# Patient Record
Sex: Male | Born: 1935 | Race: Black or African American | Hispanic: No | Smoking: Never smoker
Health system: Southern US, Community
[De-identification: ages and names within clinical notes are randomized; demographics above are authoritative.]

## PROBLEM LIST (undated history)

## (undated) DIAGNOSIS — I099 Rheumatic heart disease, unspecified: Secondary | ICD-10-CM

## (undated) DIAGNOSIS — I495 Sick sinus syndrome: Secondary | ICD-10-CM

## (undated) DIAGNOSIS — M545 Low back pain, unspecified: Secondary | ICD-10-CM

## (undated) DIAGNOSIS — I739 Peripheral vascular disease, unspecified: Secondary | ICD-10-CM

## (undated) DIAGNOSIS — C61 Malignant neoplasm of prostate: Secondary | ICD-10-CM

## (undated) DIAGNOSIS — I4891 Unspecified atrial fibrillation: Secondary | ICD-10-CM

## (undated) DIAGNOSIS — I10 Essential (primary) hypertension: Secondary | ICD-10-CM

## (undated) DIAGNOSIS — R0602 Shortness of breath: Secondary | ICD-10-CM

## (undated) DIAGNOSIS — Z950 Presence of cardiac pacemaker: Secondary | ICD-10-CM

## (undated) DIAGNOSIS — G8929 Other chronic pain: Secondary | ICD-10-CM

## (undated) DIAGNOSIS — K559 Vascular disorder of intestine, unspecified: Secondary | ICD-10-CM

## (undated) DIAGNOSIS — F0390 Unspecified dementia without behavioral disturbance: Secondary | ICD-10-CM

## (undated) HISTORY — DX: Low back pain, unspecified: M54.50

## (undated) HISTORY — DX: Rheumatic heart disease, unspecified: I09.9

## (undated) HISTORY — DX: Sick sinus syndrome: I49.5

## (undated) HISTORY — DX: Essential (primary) hypertension: I10

## (undated) HISTORY — PX: TRANSURETHRAL RESECTION OF PROSTATE: SHX73

## (undated) HISTORY — DX: Other chronic pain: G89.29

## (undated) HISTORY — DX: Malignant neoplasm of prostate: C61

## (undated) HISTORY — DX: Low back pain: M54.5

---

## 1997-08-10 ENCOUNTER — Inpatient Hospital Stay (HOSPITAL_COMMUNITY): Admission: EM | Admit: 1997-08-10 | Discharge: 1997-08-13 | Payer: Self-pay | Admitting: Cardiology

## 1999-05-30 ENCOUNTER — Encounter: Admission: RE | Admit: 1999-05-30 | Discharge: 1999-08-28 | Payer: Self-pay | Admitting: Radiation Oncology

## 2000-03-18 HISTORY — PX: EMBOLECTOMY: SHX44

## 2000-03-18 HISTORY — PX: FEMORAL-PERONEAL BYPASS GRAFT: SHX5163

## 2000-05-30 ENCOUNTER — Encounter: Payer: Self-pay | Admitting: Vascular Surgery

## 2000-06-02 ENCOUNTER — Ambulatory Visit (HOSPITAL_COMMUNITY): Admission: RE | Admit: 2000-06-02 | Discharge: 2000-06-02 | Payer: Self-pay | Admitting: Vascular Surgery

## 2000-06-04 ENCOUNTER — Encounter: Payer: Self-pay | Admitting: Vascular Surgery

## 2000-06-04 ENCOUNTER — Inpatient Hospital Stay (HOSPITAL_COMMUNITY): Admission: RE | Admit: 2000-06-04 | Discharge: 2000-06-10 | Payer: Self-pay | Admitting: Vascular Surgery

## 2000-11-10 ENCOUNTER — Ambulatory Visit (HOSPITAL_COMMUNITY): Admission: RE | Admit: 2000-11-10 | Discharge: 2000-11-10 | Payer: Self-pay | Admitting: General Surgery

## 2000-11-19 ENCOUNTER — Encounter: Payer: Self-pay | Admitting: Emergency Medicine

## 2000-11-19 ENCOUNTER — Observation Stay (HOSPITAL_COMMUNITY): Admission: EM | Admit: 2000-11-19 | Discharge: 2000-11-20 | Payer: Self-pay | Admitting: Emergency Medicine

## 2000-11-20 ENCOUNTER — Encounter: Payer: Self-pay | Admitting: Vascular Surgery

## 2000-11-20 ENCOUNTER — Inpatient Hospital Stay (HOSPITAL_COMMUNITY): Admission: EM | Admit: 2000-11-20 | Discharge: 2000-12-14 | Payer: Self-pay | Admitting: Emergency Medicine

## 2000-11-20 ENCOUNTER — Encounter (INDEPENDENT_AMBULATORY_CARE_PROVIDER_SITE_OTHER): Payer: Self-pay | Admitting: *Deleted

## 2000-11-20 ENCOUNTER — Encounter: Payer: Self-pay | Admitting: Family Medicine

## 2000-11-21 ENCOUNTER — Encounter: Payer: Self-pay | Admitting: Vascular Surgery

## 2000-11-22 ENCOUNTER — Encounter: Payer: Self-pay | Admitting: Vascular Surgery

## 2000-11-29 ENCOUNTER — Encounter: Payer: Self-pay | Admitting: Vascular Surgery

## 2000-12-01 ENCOUNTER — Encounter: Payer: Self-pay | Admitting: Vascular Surgery

## 2001-02-10 ENCOUNTER — Ambulatory Visit (HOSPITAL_COMMUNITY): Admission: RE | Admit: 2001-02-10 | Discharge: 2001-02-10 | Payer: Self-pay | Admitting: Cardiology

## 2001-09-29 ENCOUNTER — Inpatient Hospital Stay (HOSPITAL_COMMUNITY): Admission: RE | Admit: 2001-09-29 | Discharge: 2001-10-01 | Payer: Self-pay | Admitting: General Surgery

## 2001-09-29 ENCOUNTER — Encounter: Payer: Self-pay | Admitting: General Surgery

## 2001-10-22 ENCOUNTER — Emergency Department (HOSPITAL_COMMUNITY): Admission: EM | Admit: 2001-10-22 | Discharge: 2001-10-22 | Payer: Self-pay | Admitting: *Deleted

## 2001-10-28 ENCOUNTER — Ambulatory Visit (HOSPITAL_COMMUNITY): Admission: RE | Admit: 2001-10-28 | Discharge: 2001-10-28 | Payer: Self-pay | Admitting: Cardiology

## 2002-03-08 ENCOUNTER — Inpatient Hospital Stay (HOSPITAL_COMMUNITY): Admission: EM | Admit: 2002-03-08 | Discharge: 2002-03-10 | Payer: Self-pay | Admitting: Emergency Medicine

## 2002-06-11 ENCOUNTER — Inpatient Hospital Stay (HOSPITAL_COMMUNITY): Admission: AD | Admit: 2002-06-11 | Discharge: 2002-06-14 | Payer: Self-pay | Admitting: Family Medicine

## 2002-06-21 ENCOUNTER — Inpatient Hospital Stay (HOSPITAL_COMMUNITY): Admission: EM | Admit: 2002-06-21 | Discharge: 2002-06-23 | Payer: Self-pay | Admitting: Emergency Medicine

## 2002-06-21 ENCOUNTER — Encounter: Payer: Self-pay | Admitting: General Surgery

## 2002-10-03 ENCOUNTER — Emergency Department (HOSPITAL_COMMUNITY): Admission: EM | Admit: 2002-10-03 | Discharge: 2002-10-03 | Payer: Self-pay | Admitting: *Deleted

## 2002-10-03 ENCOUNTER — Encounter: Payer: Self-pay | Admitting: Emergency Medicine

## 2004-01-20 ENCOUNTER — Ambulatory Visit: Payer: Self-pay | Admitting: *Deleted

## 2004-05-10 ENCOUNTER — Ambulatory Visit: Payer: Self-pay | Admitting: Cardiology

## 2004-06-13 ENCOUNTER — Ambulatory Visit: Payer: Self-pay | Admitting: *Deleted

## 2004-07-13 ENCOUNTER — Ambulatory Visit: Payer: Self-pay | Admitting: Cardiology

## 2004-08-29 ENCOUNTER — Ambulatory Visit (HOSPITAL_COMMUNITY): Admission: RE | Admit: 2004-08-29 | Discharge: 2004-08-29 | Payer: Self-pay | Admitting: Cardiology

## 2004-08-29 ENCOUNTER — Ambulatory Visit: Payer: Self-pay | Admitting: Cardiology

## 2004-08-29 ENCOUNTER — Ambulatory Visit: Payer: Self-pay | Admitting: *Deleted

## 2004-11-26 ENCOUNTER — Ambulatory Visit: Payer: Self-pay | Admitting: *Deleted

## 2005-02-12 ENCOUNTER — Ambulatory Visit: Payer: Self-pay | Admitting: Cardiology

## 2005-04-09 ENCOUNTER — Ambulatory Visit: Payer: Self-pay | Admitting: *Deleted

## 2005-05-02 ENCOUNTER — Ambulatory Visit: Payer: Self-pay | Admitting: Cardiology

## 2005-07-10 ENCOUNTER — Ambulatory Visit: Payer: Self-pay | Admitting: *Deleted

## 2005-08-20 ENCOUNTER — Ambulatory Visit: Payer: Self-pay | Admitting: *Deleted

## 2005-08-20 ENCOUNTER — Ambulatory Visit (HOSPITAL_COMMUNITY): Admission: RE | Admit: 2005-08-20 | Discharge: 2005-08-20 | Payer: Self-pay | Admitting: Cardiology

## 2005-12-02 ENCOUNTER — Ambulatory Visit: Payer: Self-pay | Admitting: Cardiology

## 2006-01-02 ENCOUNTER — Ambulatory Visit: Payer: Self-pay | Admitting: Cardiology

## 2006-02-05 ENCOUNTER — Ambulatory Visit: Payer: Self-pay | Admitting: Cardiology

## 2006-04-02 ENCOUNTER — Ambulatory Visit: Payer: Self-pay | Admitting: Cardiology

## 2006-06-16 ENCOUNTER — Ambulatory Visit: Payer: Self-pay | Admitting: Internal Medicine

## 2006-07-24 ENCOUNTER — Ambulatory Visit: Payer: Self-pay | Admitting: Cardiology

## 2007-03-09 ENCOUNTER — Ambulatory Visit: Payer: Self-pay | Admitting: Internal Medicine

## 2007-04-29 ENCOUNTER — Ambulatory Visit: Payer: Self-pay | Admitting: Cardiology

## 2007-05-01 ENCOUNTER — Ambulatory Visit: Payer: Self-pay | Admitting: Cardiology

## 2007-05-01 ENCOUNTER — Ambulatory Visit (HOSPITAL_COMMUNITY): Admission: RE | Admit: 2007-05-01 | Discharge: 2007-05-01 | Payer: Self-pay | Admitting: Cardiology

## 2007-05-25 ENCOUNTER — Ambulatory Visit: Payer: Self-pay | Admitting: Internal Medicine

## 2007-06-01 ENCOUNTER — Ambulatory Visit: Payer: Self-pay | Admitting: Cardiology

## 2007-06-15 ENCOUNTER — Ambulatory Visit: Payer: Self-pay | Admitting: Cardiology

## 2007-06-22 ENCOUNTER — Ambulatory Visit: Payer: Self-pay | Admitting: Cardiology

## 2007-09-21 ENCOUNTER — Ambulatory Visit: Payer: Self-pay | Admitting: Cardiovascular Disease

## 2007-10-14 ENCOUNTER — Ambulatory Visit: Payer: Self-pay | Admitting: Cardiovascular Disease

## 2007-12-31 ENCOUNTER — Ambulatory Visit: Payer: Self-pay | Admitting: Cardiology

## 2007-12-31 ENCOUNTER — Encounter (INDEPENDENT_AMBULATORY_CARE_PROVIDER_SITE_OTHER): Payer: Self-pay | Admitting: *Deleted

## 2007-12-31 LAB — CONVERTED CEMR LAB
ALT: 9 units/L
BUN: 21 mg/dL
Basophils Absolute: 0 10*3/uL
CO2: 19 meq/L
Calcium: 8.8 mg/dL
Chloride: 105 meq/L
Creatinine, Ser: 1.04 mg/dL
Eosinophils Relative: 0.2 %
Lymphocytes Relative: 35 %
Lymphs Abs: 1.4 10*3/uL
Platelets: 213 10*3/uL
Potassium: 4.6 meq/L
Total Protein: 7.5 g/dL
WBC: 4.1 10*3/uL

## 2008-01-04 ENCOUNTER — Ambulatory Visit: Payer: Self-pay | Admitting: Cardiology

## 2008-02-18 ENCOUNTER — Ambulatory Visit: Payer: Self-pay | Admitting: Cardiology

## 2008-03-14 ENCOUNTER — Ambulatory Visit: Payer: Self-pay | Admitting: Cardiology

## 2008-06-28 ENCOUNTER — Inpatient Hospital Stay (HOSPITAL_COMMUNITY): Admission: EM | Admit: 2008-06-28 | Discharge: 2008-07-01 | Payer: Self-pay | Admitting: Emergency Medicine

## 2008-07-08 ENCOUNTER — Ambulatory Visit: Payer: Self-pay | Admitting: Cardiology

## 2008-07-14 ENCOUNTER — Ambulatory Visit: Payer: Self-pay | Admitting: Cardiology

## 2008-09-26 ENCOUNTER — Ambulatory Visit: Payer: Self-pay | Admitting: Cardiology

## 2008-10-17 ENCOUNTER — Encounter: Payer: Self-pay | Admitting: Cardiology

## 2008-10-19 ENCOUNTER — Ambulatory Visit: Payer: Self-pay | Admitting: Cardiology

## 2008-10-31 ENCOUNTER — Encounter: Payer: Self-pay | Admitting: *Deleted

## 2008-11-02 ENCOUNTER — Ambulatory Visit: Payer: Self-pay

## 2008-11-02 LAB — CONVERTED CEMR LAB: POC INR: 2.8

## 2008-12-07 ENCOUNTER — Encounter (INDEPENDENT_AMBULATORY_CARE_PROVIDER_SITE_OTHER): Payer: Self-pay | Admitting: Cardiology

## 2008-12-29 ENCOUNTER — Ambulatory Visit: Payer: Self-pay | Admitting: Cardiology

## 2009-02-02 ENCOUNTER — Encounter (INDEPENDENT_AMBULATORY_CARE_PROVIDER_SITE_OTHER): Payer: Self-pay | Admitting: Cardiology

## 2009-02-06 ENCOUNTER — Ambulatory Visit: Payer: Self-pay | Admitting: Cardiology

## 2009-02-06 LAB — CONVERTED CEMR LAB: POC INR: 3.3

## 2009-04-05 ENCOUNTER — Encounter (INDEPENDENT_AMBULATORY_CARE_PROVIDER_SITE_OTHER): Payer: Self-pay | Admitting: Cardiology

## 2009-04-20 ENCOUNTER — Ambulatory Visit: Payer: Self-pay | Admitting: Cardiology

## 2009-04-20 LAB — CONVERTED CEMR LAB: POC INR: 3.6

## 2009-05-18 ENCOUNTER — Encounter (INDEPENDENT_AMBULATORY_CARE_PROVIDER_SITE_OTHER): Payer: Self-pay | Admitting: *Deleted

## 2009-06-01 ENCOUNTER — Ambulatory Visit: Payer: Self-pay | Admitting: Cardiology

## 2009-06-01 LAB — CONVERTED CEMR LAB: POC INR: 6.3

## 2009-06-05 ENCOUNTER — Ambulatory Visit: Payer: Self-pay | Admitting: Cardiology

## 2009-06-14 ENCOUNTER — Encounter (INDEPENDENT_AMBULATORY_CARE_PROVIDER_SITE_OTHER): Payer: Self-pay | Admitting: *Deleted

## 2009-07-13 ENCOUNTER — Encounter (INDEPENDENT_AMBULATORY_CARE_PROVIDER_SITE_OTHER): Payer: Self-pay | Admitting: Pharmacist

## 2009-08-09 ENCOUNTER — Ambulatory Visit: Payer: Self-pay | Admitting: Cardiology

## 2009-08-09 LAB — CONVERTED CEMR LAB: POC INR: 3.5

## 2009-09-07 ENCOUNTER — Encounter (INDEPENDENT_AMBULATORY_CARE_PROVIDER_SITE_OTHER): Payer: Self-pay | Admitting: *Deleted

## 2009-09-11 ENCOUNTER — Encounter (INDEPENDENT_AMBULATORY_CARE_PROVIDER_SITE_OTHER): Payer: Self-pay | Admitting: *Deleted

## 2009-09-11 ENCOUNTER — Ambulatory Visit: Payer: Self-pay | Admitting: Cardiology

## 2009-09-11 DIAGNOSIS — I495 Sick sinus syndrome: Secondary | ICD-10-CM

## 2009-09-11 DIAGNOSIS — M545 Low back pain: Secondary | ICD-10-CM | POA: Insufficient documentation

## 2009-09-11 DIAGNOSIS — Z8679 Personal history of other diseases of the circulatory system: Secondary | ICD-10-CM | POA: Insufficient documentation

## 2009-09-11 DIAGNOSIS — Z8546 Personal history of malignant neoplasm of prostate: Secondary | ICD-10-CM | POA: Insufficient documentation

## 2009-09-11 DIAGNOSIS — I050 Rheumatic mitral stenosis: Secondary | ICD-10-CM

## 2009-09-11 DIAGNOSIS — E785 Hyperlipidemia, unspecified: Secondary | ICD-10-CM

## 2009-09-11 LAB — CONVERTED CEMR LAB: POC INR: 4.6

## 2009-09-12 ENCOUNTER — Encounter: Payer: Self-pay | Admitting: Cardiology

## 2009-09-22 ENCOUNTER — Encounter: Payer: Self-pay | Admitting: Cardiology

## 2009-09-22 ENCOUNTER — Encounter (INDEPENDENT_AMBULATORY_CARE_PROVIDER_SITE_OTHER): Payer: Self-pay | Admitting: *Deleted

## 2009-09-22 LAB — CONVERTED CEMR LAB
ALT: 13 units/L
Albumin: 4 g/dL
Albumin: 4 g/dL (ref 3.5–5.2)
Alkaline Phosphatase: 102 units/L
Alkaline Phosphatase: 102 units/L (ref 39–117)
BUN: 19 mg/dL (ref 6–23)
Basophils Absolute: 0 10*3/uL
CO2: 25 meq/L (ref 19–32)
Calcium: 9 mg/dL (ref 8.4–10.5)
Chloride: 103 meq/L (ref 96–112)
Cholesterol: 219 mg/dL
Creatinine, Ser: 1.15 mg/dL
Eosinophils Relative: 5 % (ref 0–5)
Glucose, Bld: 86 mg/dL (ref 70–99)
HCT: 42.8 % (ref 39.0–52.0)
HDL: 56 mg/dL
HDL: 56 mg/dL (ref >39)
Hemoglobin: 14.3 g/dL (ref 13.0–17.0)
LDL Cholesterol: 142 mg/dL
LDL Cholesterol: 142 mg/dL — ABNORMAL HIGH (ref 0–99)
Lymphocytes Relative: 38 %
Lymphocytes Relative: 38 % (ref 12–46)
Lymphs Abs: 1.5 10*3/uL
Lymphs Abs: 1.5 10*3/uL (ref 0.7–4.0)
MCHC: 33.4 g/dL
MCV: 85.6 fL
Monocytes Absolute: 0.5 10*3/uL
Monocytes Absolute: 0.5 10*3/uL (ref 0.1–1.0)
Monocytes Relative: 12 % (ref 3–12)
Neutro Abs: 1.8 10*3/uL (ref 1.7–7.7)
OCCULT 3: NEGATIVE
Platelets: 155 10*3/uL
Potassium: 4.1 meq/L (ref 3.5–5.3)
RBC: 5 M/uL (ref 4.22–5.81)
RDW: 15.1 % (ref 11.5–15.5)
Sodium: 140 meq/L
Sodium: 140 meq/L (ref 135–145)
Total Protein: 7.4 g/dL
Total Protein: 7.4 g/dL (ref 6.0–8.3)
Triglycerides: 104 mg/dL (ref <150)
WBC: 4 10*3/uL

## 2009-09-26 ENCOUNTER — Encounter (INDEPENDENT_AMBULATORY_CARE_PROVIDER_SITE_OTHER): Payer: Self-pay | Admitting: *Deleted

## 2009-09-26 ENCOUNTER — Ambulatory Visit: Payer: Self-pay | Admitting: Cardiology

## 2009-10-04 ENCOUNTER — Ambulatory Visit: Payer: Self-pay | Admitting: Cardiology

## 2009-10-04 LAB — CONVERTED CEMR LAB: POC INR: 5.3

## 2009-10-16 ENCOUNTER — Encounter (INDEPENDENT_AMBULATORY_CARE_PROVIDER_SITE_OTHER): Payer: Self-pay | Admitting: *Deleted

## 2009-10-28 ENCOUNTER — Emergency Department (HOSPITAL_COMMUNITY): Admission: EM | Admit: 2009-10-28 | Discharge: 2009-10-29 | Payer: Self-pay | Admitting: Emergency Medicine

## 2009-10-30 ENCOUNTER — Ambulatory Visit: Payer: Self-pay | Admitting: Cardiology

## 2009-11-10 ENCOUNTER — Encounter: Payer: Self-pay | Admitting: Cardiology

## 2009-11-22 ENCOUNTER — Encounter (INDEPENDENT_AMBULATORY_CARE_PROVIDER_SITE_OTHER): Payer: Self-pay | Admitting: *Deleted

## 2009-11-22 ENCOUNTER — Encounter: Payer: Self-pay | Admitting: Cardiology

## 2009-11-29 ENCOUNTER — Encounter (INDEPENDENT_AMBULATORY_CARE_PROVIDER_SITE_OTHER): Payer: Self-pay | Admitting: Pharmacist

## 2009-12-27 ENCOUNTER — Ambulatory Visit: Payer: Self-pay | Admitting: Cardiology

## 2009-12-27 LAB — CONVERTED CEMR LAB: POC INR: 1.2

## 2010-01-04 ENCOUNTER — Ambulatory Visit: Payer: Self-pay | Admitting: Cardiology

## 2010-01-25 ENCOUNTER — Encounter (INDEPENDENT_AMBULATORY_CARE_PROVIDER_SITE_OTHER): Payer: Self-pay | Admitting: *Deleted

## 2010-04-11 ENCOUNTER — Encounter (INDEPENDENT_AMBULATORY_CARE_PROVIDER_SITE_OTHER): Payer: Self-pay | Admitting: *Deleted

## 2010-04-13 ENCOUNTER — Ambulatory Visit
Admission: RE | Admit: 2010-04-13 | Discharge: 2010-04-13 | Payer: Self-pay | Source: Home / Self Care | Attending: Cardiology | Admitting: Cardiology

## 2010-04-13 ENCOUNTER — Encounter: Payer: Self-pay | Admitting: Cardiology

## 2010-04-13 LAB — CONVERTED CEMR LAB
Casts: NONE SEEN /lpf
Crystals: NONE SEEN
Ketones, ur: NEGATIVE mg/dL
Nitrite: NEGATIVE
Specific Gravity, Urine: 1.005 (ref 1.005–1.030)
pH: 8 (ref 5.0–8.0)

## 2010-04-16 ENCOUNTER — Ambulatory Visit
Admission: RE | Admit: 2010-04-16 | Discharge: 2010-04-16 | Payer: Self-pay | Source: Home / Self Care | Attending: Cardiology | Admitting: Cardiology

## 2010-04-16 ENCOUNTER — Ambulatory Visit (HOSPITAL_COMMUNITY)
Admission: RE | Admit: 2010-04-16 | Discharge: 2010-04-16 | Payer: Self-pay | Source: Home / Self Care | Attending: Cardiology | Admitting: Cardiology

## 2010-04-16 ENCOUNTER — Telehealth (INDEPENDENT_AMBULATORY_CARE_PROVIDER_SITE_OTHER): Payer: Self-pay | Admitting: *Deleted

## 2010-04-16 LAB — CONVERTED CEMR LAB
AST: 23 units/L (ref 0–37)
BUN: 20 mg/dL (ref 6–23)
Basophils Relative: 1 % (ref 0–1)
Calcium: 9.7 mg/dL (ref 8.4–10.5)
Chloride: 102 meq/L (ref 96–112)
Creatinine, Ser: 1.11 mg/dL (ref 0.40–1.50)
Eosinophils Absolute: 0.2 10*3/uL (ref 0.0–0.7)
Eosinophils Relative: 4 % (ref 0–5)
HCT: 34.4 % — ABNORMAL LOW (ref 39.0–52.0)
Hemoglobin: 11.4 g/dL — ABNORMAL LOW (ref 13.0–17.0)
MCHC: 33.1 g/dL (ref 30.0–36.0)
MCV: 84.1 fL (ref 78.0–100.0)
Monocytes Absolute: 0.7 10*3/uL (ref 0.1–1.0)
Monocytes Relative: 15 % — ABNORMAL HIGH (ref 3–12)
RBC: 4.09 M/uL — ABNORMAL LOW (ref 4.22–5.81)
Total Bilirubin: 0.6 mg/dL (ref 0.3–1.2)

## 2010-04-17 NOTE — Assessment & Plan Note (Signed)
Summary: past due for f/u/tg  Medications Added MAG-OX 400 400 MG TABS (MAGNESIUM OXIDE) Take 1 tablet by mouth once a day FUROSEMIDE 40 MG TABS (FUROSEMIDE) Take 1 tablet by mouth once a day METOPROLOL TARTRATE 25 MG TABS (METOPROLOL TARTRATE) Take 1 tablet by mouth once a day POLY-IRON 150 FORTE 150-25-1 MG-MCG-MG CAPS (IRON POLYSACCH CMPLX-B12-FA) Take 1 tablet by mouth once a day      Allergies Added: NKDA  Visit Type:  past due for follow up Primary Provider:  Dr. Regino Chapman   History of Present Illness: [name] returns to the office somewhat beyond his scheduled visit for continued assessment and treatment of rheumatic heart disease with a history of paroxysmal atrial fibrillation.  Since I last saw him more than a year ago, he has been astoundingly well.  He has not required treatment in  the emergency department nor been admitted to hospital.  He denies new medical problems.  He is stressed by a daughter, who lives in his house, but who has previously been incarcerated and a friend who also lives in his house.  He is taking legal action in an attempt to evict them, but that has not been successful.  He does all his own cooking.  He reports anorexia of late with some weight loss.  He has a sedentary lifestyle, but typically is asymptomatic.  He notes occasional dyspnea with exertion.  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  EKG  Procedure date:  09/11/2009  Findings:      Atrial fibrillation with a slow ventricular response. Heart rate of 42 bpm. Rightward axis Nondiagnostic lateral Q waves Delayed R-wave progression Voltage criteria for LVH No previous tracings for comparison   Preventive Screening-Counseling & Management  Alcohol-Tobacco     Smoking Status: quit > 6 months  Current Medications (verified): 1)  Warfarin Sodium 5 Mg Tabs (Warfarin Sodium) .... Take As Directed By Coumadin Clinic 2)  Mag-Ox 400 400 Mg Tabs  (Magnesium Oxide) .... Take 1 Tablet By Mouth Once A Day 3)  Furosemide 40 Mg Tabs (Furosemide) .... Take 1 Tablet By Mouth Once A Day 4)  Cardizem 120 Mg Tabs (Diltiazem Hcl) 5)  Poly-Iron 150 Forte 150-25-1 Mg-Mcg-Mg Caps (Iron Polysacch Cmplx-B12-Fa) .... Take 1 Tablet By Mouth Once A Day  Allergies (verified): No Known Drug Allergies  Past History:  PMH, FH, and Social History reviewed and updated.  Past Medical History: Rheumatic heart disease with moderate mitral stenosis and history of CHF Hypertension Sick sinus syndrome: Paroxysmal and then constant AF; slow rate on modest AV Chapman blocking agents Carcinoma of the prostate treated with external beam RT in 2001 Chronic low back pain  Past Surgical History: Transurethral resection of the prostate Lumbosacral spinal fusion  Social History: Smoking Status:  quit > 6 months  Review of Systems       The patient complains of anorexia, weight loss, and dyspnea on exertion.  The patient denies fever, chest pain, syncope, peripheral edema, prolonged cough, headaches, hemoptysis, abdominal pain, and melena.    Vital Signs:  Patient profile:   77 year old male Height:      68 inches Weight:      118 pounds BMI:     18.01 O2 Sat:      94 % on Room air Pulse rate:   44 / minute BP sitting:   186 / 92  (left arm)  Vitals Entered By: Billy Lower RN (  September 11, 2009 1:57 PM)  O2 Flow:  Room air  Physical Exam  General:  Thin; well developed; no acute distress:   HEENT-poor dentition Neck-No JVD; no carotid bruits: Lungs-No tachypnea, no rales; no rhonchi; no wheezes:coarse breath sounds at the right base Cardiovascular-normal PMI; split S1 vs. fourth heart sound and normal S2; grade 2/6 rumbling diastolic murmur only heard over the apex, which is moderately displaced laterally and is forceful; opening snap present. Abdomen-BS normal; soft and non-tender without masses or organomegaly:  Musculoskeletal-No deformities, no  cyanosis or clubbing: Neurologic-Normal cranial nerves; symmetric strength and tone:  Skin-Warm, no significant lesions: Extremities-Nl distal pulses; no edema:     Impression & Recommendations:  Problem # 1:  RHEUMATIC MITRAL STENOSIS (ICD-394.0) Patient has done well in recent years with essentially no symptoms attributable to cardiac disease.  No echocardiogram has been obtained for at least the past 3 years, and a repeat study is in order.  Patient continues to need anticoagulation for atrial fibrillation, but not antibiotic prophylaxis.  Problem # 2:  SICK SINUS SYNDROME (ICD-427.81) Patient has underlying atrial fibrillation with bradycardia despite treatment with relatively low doses of beta blocker and diltiazem.  Metoprolol will be discontinued initially.  If heart rate remains slow, diltiazem dosage will be reduced or discontinued entirely.  [name] will return in one week to have his blood pressure and heart rate rechecked by the cardiology nurses.  Problem # 3:  ANTICOAGULATION (ICD-V58.61) Due to chronic anticoagulation, stool for Hemoccult testing will be obtained as well as a CBC.  Problem # 4:  HYPERTENSION (ICD-401.1) Blood pressure is substantially elevated at today's visit, but has been good when assessed in the recent past.  [name] will monitor values outside of the office and call for any significant elevations.  Otherwise, I will plan to see this nice gentleman again in 7 months.  Other Orders: Hemoccult Cards (Take Home) (Hemoccult Cards) EKG w/ Interpretation (93000) 2-D Echocardiogram (2D Echo) Future Orders: T-Comprehensive Metabolic Panel ([id number]) ... 09/19/2009 T-Lipid Profile [phone]) ... 09/19/2009 T-CBC w/Diff ([id number]) ... 09/19/2009  Patient Instructions: 1)  Your physician recommends that you schedule a follow-up appointment in: 7 months 2)  Your physician recommends that you return for lab work in: next month 3)  Your  physician has requested that you have an echocardiogram.  Echocardiography is a painless test that uses sound waves to create images of your heart. It provides your doctor with information about the size and shape of your heart and how well your heart's chambers and valves are working.  This procedure takes approximately one hour. There are no restrictions for this procedure. 4)  Your physician has asked that you test your stool for blood. It is necessary to test 3 different stool specimens for accuracy. You will be given 3 hemoccult cards for specimen collection. For each stool specimen, place a small portion of stool sample (from 2 different areas of the stool) into the 2 squares on the card. Close card. Repeat with 2 more stool specimens. Bring the cards back to the office for testing. 5)  Your physician has recommended you make the following change in your medication: stop metoprolol and check rhythm strip at next coumadin visit

## 2010-04-17 NOTE — Letter (Signed)
Summary: [place] [facility]  [address], [place] [address]   Phone: [phone]  Fax: [phone]     September 11, 2009 MRN: [id number]   [facility] [address] [place], [place]  [address]      YOUR LAB WORK IS DUE   [name] , 2009  Please go to [facility], located across the street from [facility] on the second floor.  Hours are Monday - Friday 7am until 7:30pm         Saturday 8am until 12noon    _x  DO NOT EAT OR DRINK AFTER MIDNIGHT EVENING PRIOR TO LABWORK  __ YOUR LABWORK IS NOT FASTING --YOU MAY EAT PRIOR TO LABWORK

## 2010-04-17 NOTE — Letter (Signed)
Summary: Custom - Delinquent Coumadin 1  [facility]  [address] [place] [address]   Phone: [phone]  Fax: [phone]     April 05, 2009 MRN: [id number]   VIKAS WEGMANN [address] [place], [place]  [address]   Dear Mr. TESCHNER,  This letter is being sent to you as a reminder that it is necessary for you to get your INR/PT checked regularly so that we can optimize your care.  Our records indicate that you were scheduled to have a test done recently.  As of today, we have not received the results of this test.  It is very important that you have your INR checked.  Please call our office at the number listed above to schedule an appointment at your earliest convenience.    If you have recently had your protime checked or have discontinued this medication, please contact our office at the above phone number to clarify this issue.  Thank you for this prompt attention to this important health care matter.  Sincerely, Vashti Hey RN  [facility] Cardiovascular Risk Reduction [facility] Team

## 2010-04-17 NOTE — Miscellaneous (Signed)
Summary: LABS CBCD,CMP,BNP,12/31/2007  Clinical Lists Changes  Observations: Added new observation of ABSOLUTE BAS: 0.0 K/uL (12/31/2007 16:48) Added new observation of BASOPHIL %: 1 % (12/31/2007 16:48) Added new observation of EOS ABSLT: 4 K/uL (12/31/2007 16:48) Added new observation of % EOS AUTO: 0.2 % (12/31/2007 16:48) Added new observation of ABSOLUTE MON: 0.6 K/uL (12/31/2007 16:48) Added new observation of MONOCYTE %: 15 % (12/31/2007 16:48) Added new observation of ABS LYMPHOCY: 1.4 K/uL (12/31/2007 16:48) Added new observation of LYMPHS %: 35 % (12/31/2007 16:48) Added new observation of CALCIUM: 8.8 mg/dL (41/32/4401 02:72) Added new observation of ALBUMIN: 3.9 g/dL (53/66/4403 47:42) Added new observation of PROTEIN, TOT: 7.5 g/dL (59/56/3875 64:33) Added new observation of SGPT (ALT): 9 units/L (12/31/2007 16:48) Added new observation of SGOT (AST): 23 units/L (12/31/2007 16:48) Added new observation of ALK PHOS: 110 units/L (12/31/2007 16:48) Added new observation of CREATININE: 1.04 mg/dL (29/51/8841 66:06) Added new observation of BUN: 21 mg/dL (30/16/0109 32:35) Added new observation of BG RANDOM: 84 mg/dL (57/32/2025 42:70) Added new observation of CO2 PLSM/SER: 19 meq/L (12/31/2007 16:48) Added new observation of CL SERUM: 105 meq/L (12/31/2007 16:48) Added new observation of K SERUM: 4.6 meq/L (12/31/2007 16:48) Added new observation of NA: 140 meq/L (12/31/2007 16:48) Added new observation of PLATELETK/UL: 213 K/uL (12/31/2007 16:48) Added new observation of MCV: 84.4 fL (12/31/2007 16:48) Added new observation of HCT: 40.6 % (12/31/2007 16:48) Added new observation of HGB: 13.6 g/dL (62/37/6283 15:17) Added new observation of WBC COUNT: 4.1 10*3/microliter (12/31/2007 16:48)

## 2010-04-17 NOTE — Medication Information (Signed)
Summary: ccr-lr  Anticoagulant Therapy  Managed by: Vashti Hey, RN PCP: Dr. 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 no    Recent/future dental: no  Any missed doses?: no       Is patient compliant with meds? yes       Allergies: No Known Drug Allergies  Anticoagulation Management History:      The patient is taking warfarin and comes in today for a routine follow up visit.  Positive risk factors for bleeding include an age of 75 years or older.  The bleeding index is 'intermediate risk'.  Positive CHADS2 values include History of HTN and Age > 22 years old.  The start date was 05/12/1995.  Anticoagulation responsible provider: Daleen Squibb MD, Maisie Fus.  INR POC: 5.3.  Cuvette Lot#: [id number].    Anticoagulation Management Assessment/Plan:      The patient's current anticoagulation dose is Warfarin sodium 5 mg tabs: take as directed by Coumadin Clinic.  The target INR is 3 - 3.5.  The next INR is due 10/16/2009.  Anticoagulation instructions were given to patient.  Results were reviewed/authorized by Vashti Hey, RN.  He was notified by Vashti Hey RN.         Prior Anticoagulation Instructions: INR 4.6 Hold coumadin tonight, take 1/2 tablet tomorrow night then resume 1 tablet once daily   Current Anticoagulation Instructions: INR 5.3 Do not take coumadin tonight or tomorrow night then decrease dose to 1 tablet once daily except 1/2 tablet on Mondays and Thursdays

## 2010-04-17 NOTE — Letter (Signed)
Summary: Appointment - Missed  [name] [place]  [address] [place] [address]   Phone: [phone]  Fax: [phone]     October 16, 2009 MRN: [id number]   [name] [address] [place], [place]  [address]   Dear Mr. WALMER,  Our records indicate you missed your appointment on        10/16/09 COUMADIN CHECK AND NURSE VISIT         It is very important that we reach you to reschedule this appointment. We look forward to participating in your health care needs. Please contact us at the number listed above at your earliest convenience to reschedule this appointment.     Sincerely,    Glass blower/designer

## 2010-04-17 NOTE — Medication Information (Signed)
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 no    Recent/future dental: no  Any missed doses?: no       Is patient compliant with meds? yes       Allergies: No Known Drug Allergies  Anticoagulation Management History:      The patient is taking warfarin and comes in today for a routine follow up visit.  Positive risk factors for bleeding include an age of 75 years or older.  The bleeding index is 'intermediate risk'.  Positive CHADS2 values include Age > 71 years old.  The start date was 05/12/1995.  Anticoagulation responsible provider: Dietrich Pates MD, Molly Maduro.  INR POC: 4.6.  Cuvette Lot#: [id number].    Anticoagulation Management Assessment/Plan:      The patient's current anticoagulation dose is Warfarin sodium 5 mg tabs: take as directed by Coumadin Clinic.  The target INR is 3 - 3.5.  The next INR is due 10/04/2009.  Anticoagulation instructions were given to patient.  Results were reviewed/authorized by Vashti Hey, RN.  He was notified by Vashti Hey RN.         Prior Anticoagulation Instructions: INR 3.5 Continue coumadin 5mg  once daily   Current Anticoagulation Instructions: INR 4.6 Hold coumadin tonight, take 1/2 tablet tomorrow night then resume 1 tablet once daily

## 2010-04-17 NOTE — Letter (Signed)
Summary: Appointment - Missed  [facility]     [place], [place]    Phone:   Fax:      November 22, 2009 MRN: [id number]   [name] [address] [place], [place]  [address]   Dear Mr. BUSS,  Our records indicate you missed your appointment on           11/22/09 COUMADIN CLINIC              It is very important that we reach you to reschedule this appointment. We look forward to participating in your health care needs. Please contact us at the number listed above at your earliest convenience to reschedule this appointment.     Sincerely,    Glass blower/designer

## 2010-04-17 NOTE — Medication Information (Signed)
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 no    Recent/future dental: no  Any missed doses?: no       Is patient compliant with meds? yes       Anticoagulation Management History:      The patient is taking warfarin and comes in today for a routine follow up visit.  Positive risk factors for bleeding include an age of 16 years or older.  The bleeding index is 'intermediate risk'.  Positive CHADS2 values include Age > 105 years old.  The start date was 05/12/1995.  Anticoagulation responsible provider: Dietrich Pates MD, Molly Maduro.  INR POC: 2.4.  Cuvette Lot#: [id number].    Anticoagulation Management Assessment/Plan:      The target INR is 3 - 3.5.  The next INR is due 06/14/2009.  Anticoagulation instructions were given to patient.  Results were reviewed/authorized by Vashti Hey, RN.  He was notified by Vashti Hey RN.         Prior Anticoagulation Instructions: INR 6.3  Hold coumadin x 3 nights (Thursday, Friday and Saturday) take 1 tablet on Sunday then recheck on Monday 06/05/09  Current Anticoagulation Instructions: INR 2.4 Take coumadin 1 1/2 tablets tonight then resume 1 tablet once daily

## 2010-04-17 NOTE — Medication Information (Signed)
Summary: [facility]  Anticoagulant Therapy  Managed by: Vashti Hey, RN PCP: Dr. 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 no    Recent/future dental: no  Any missed doses?: no       Is patient compliant with meds? yes       Allergies: No Known Drug Allergies  Anticoagulation Management History:      The patient is taking warfarin and comes in today for a routine follow up visit.  Positive risk factors for bleeding include an age of 75 years or older.  The bleeding index is 'intermediate risk'.  Positive CHADS2 values include History of HTN and Age > 13 years old.  The start date was 05/12/1995.  Anticoagulation responsible provider: Daleen Squibb MD, Maisie Fus.    Anticoagulation Management Assessment/Plan:      The patient's current anticoagulation dose is Warfarin sodium 5 mg tabs: take as directed by Coumadin Clinic.  The target INR is 3 - 3.5.  The next INR is due 11/22/2009.  Anticoagulation instructions were given to patient.  Results were reviewed/authorized by Vashti Hey, RN.  He was notified by Vashti Hey RN.         Prior Anticoagulation Instructions: INR 5.3 Do not take coumadin tonight or tomorrow night then decrease dose to 1 tablet once daily except 1/2 tablet on Mondays and Thursdays  Current Anticoagulation Instructions: INR 2.8 Pt has been taking 5mg  once daily  He did not decrease dose as ordered last OV Continue coumadin 5mg  once daily

## 2010-04-17 NOTE — Letter (Signed)
Summary: Report of Adult Protective Services  aps notice to report   Imported By: Faythe Ghee 11/10/2009 13:26:43  _____________________________________________________________________  External Attachment:    Type:   Image     Comment:   External Document

## 2010-04-17 NOTE — Letter (Signed)
Summary: Appointment - Missed  [facility] [place]  [address] [place] [address]   Phone: [phone]  Fax: [phone]     May 18, 2009 MRN: [id number]   DIVINE HANSLEY [address] [place], [place]  [address]   Dear Mr. COOR,  Our records indicate you missed your appointment on   05/18/09    with COUMADIN CLINIC  It is very important that we reach you to reschedule this appointment. We look forward to participating in your health care needs. Please contact us at the number listed above at your earliest convenience to reschedule this appointment.     Sincerely,    Glass blower/designer

## 2010-04-17 NOTE — Letter (Signed)
Summary: Appointment - Missed  [facility] [place]  [address] [place] [address]   Phone: [phone]  Fax: [phone]     June 14, 2009 MRN: [id number]   [name] [address] [place], [place]  [address]   Dear [name],  Our records indicate you missed your appointment on 06/14/09 COUMADIN CLINIC                          It is very important that we reach you to reschedule this appointment. We look forward to participating in your health care needs. Please contact us at the number listed above at your earliest convenience to reschedule this appointment.     Sincerely,    Glass blower/designer

## 2010-04-17 NOTE — Medication Information (Signed)
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 no    Recent/future dental: no  Any missed doses?: no       Is patient compliant with meds? yes       Anticoagulation Management History:      The patient is taking warfarin and comes in today for a routine follow up visit.  Positive risk factors for bleeding include an age of 65 years or older.  The bleeding index is 'intermediate risk'.  Positive CHADS2 values include Age > 51 years old.  The start date was 05/12/1995.  Anticoagulation responsible provider: Dietrich Pates MD, Molly Maduro.  INR POC: 3.6.  Cuvette Lot#: [id number].    Anticoagulation Management Assessment/Plan:      The target INR is 3 - 3.5.  The next INR is due 05/18/2009.  Anticoagulation instructions were given to patient.  Results were reviewed/authorized by Vashti Hey, RN.  He was notified by Vashti Hey RN.         Prior Anticoagulation Instructions: INR 3.3 Continue coumadin 5mg  once daily   Current Anticoagulation Instructions: INR 3.6 Take coumadin 1/2 tablet tonight then resume 1 tablet once daily

## 2010-04-17 NOTE — Assessment & Plan Note (Signed)
Summary: NURSE  Nurse Visit   Vital Signs:  Patient profile:   77 year old male Height:      68 inches Weight:      118 pounds O2 Sat:      96 % on Room air Temp:     96.4 degrees F oral Pulse rate:   46 / minute BP sitting:   146 / 74  (right arm)  Vitals Entered By: Teressa Lower RN (October 30, 2009 5:44 PM)  O2 Flow:  Room air   Visit Type:  nurse visit for a rhythm strip Primary Provider:  Dr. Regino Schultze   History of Present Illness: S: pt seen in ED on 10/28/09 for open fx of finger B: rhythm strip at ccr visit today A: also obtained vs, reported incident to adult protective services  R:pt only had nurse visit for rhythm strip   Allergies: No Known Drug Allergies  Appended Document: NURSE I received letter from [facility], scanned into record

## 2010-04-17 NOTE — Letter (Signed)
Summary: Custom - Delinquent Coumadin 1  [facility]  [address], [place] [address]   Phone: [phone]  Fax: [phone]     November 29, 2009 MRN: [id number]   FINLEE MILO [address] [place], [place]  [address]   Dear Mr. VORA,  This letter is being sent to you as a reminder that it is necessary for you to get your INR/PT checked regularly so that we can optimize your care.  Our records indicate that you were scheduled to have a test done recently.  As of today, we have not received the results of this test.  It is very important that you have your INR checked.  Please call our office at the number listed above to schedule an appointment at your earliest convenience.    If you have recently had your protime checked or have discontinued this medication, please contact our office at the above phone number to clarify this issue.  Thank you for this prompt attention to this important health care matter.  Sincerely, Vashti Hey RN  [facility] Cardiovascular Risk Reduction Clinic Team

## 2010-04-17 NOTE — Miscellaneous (Signed)
Summary: hemoccult cards 09/22/2009  Clinical Lists Changes  Observations: Added new observation of HEMOCCULT 3: neg (09/22/2009 8:32) Added new observation of HEMOCCULT 2: neg (09/22/2009 8:32) Added new observation of HEMOCCULT 1: neg (09/22/2009 8:32)

## 2010-04-17 NOTE — Medication Information (Signed)
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 no    Recent/future dental: no  Any missed doses?: yes     Details: might have missed 1 time  Is patient compliant with meds? yes       Anticoagulation Management History:      The patient is taking warfarin and comes in today for a routine follow up visit.  Positive risk factors for bleeding include an age of 75 years or older.  The bleeding index is 'intermediate risk'.  Positive CHADS2 values include Age > 75 years old.  The start date was 05/12/1995.  Anticoagulation responsible provider: Dietrich Pates MD, Molly Maduro.  INR POC: 6.3.  Cuvette Lot#: [id number].    Anticoagulation Management Assessment/Plan:      The target INR is 3 - 3.5.  The next INR is due 06/05/2009.  Anticoagulation instructions were given to patient.  Results were reviewed/authorized by Vashti Hey, RN.  He was notified by Vashti Hey RN.         Prior Anticoagulation Instructions: INR 3.6 Take coumadin 1/2 tablet tonight then resume 1 tablet once daily   Current Anticoagulation Instructions: INR 6.3  Hold coumadin x 3 nights (Thursday, Friday and Saturday) take 1 tablet on Sunday then recheck on Monday 06/05/09

## 2010-04-17 NOTE — Medication Information (Signed)
Summary: ccr-lr  Anticoagulant Therapy  Managed by: Vashti Hey, RN PCP: Dr. 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 no    Recent/future dental: no  Any missed doses?: no       Is patient compliant with meds? yes       Allergies: No Known Drug Allergies  Anticoagulation Management History:      The patient is taking warfarin and comes in today for a routine follow up visit.  Positive risk factors for bleeding include an age of 75 years or older.  The bleeding index is 'intermediate risk'.  Positive CHADS2 values include History of HTN and Age > 74 years old.  The start date was 05/12/1995.  Anticoagulation responsible provider: Dietrich Pates MD, Molly Maduro.  INR POC: 4.1.  Cuvette Lot#: [id number].    Anticoagulation Management Assessment/Plan:      The patient's current anticoagulation dose is Warfarin sodium 5 mg tabs: take as directed by Coumadin Clinic.  The target INR is 3 - 3.5.  The next INR is due 01/25/2010.  Anticoagulation instructions were given to patient.  Results were reviewed/authorized by Vashti Hey, RN.  He was notified by Vashti Hey RN.         Prior Anticoagulation Instructions: INR 1.2 Take coumadin 2 tablets tonight and tomorrow night then resume 1 tablet once daily   Current Anticoagulation Instructions: INR 4.1 Hold coumadin tonight then resume 1 tablet once daily

## 2010-04-17 NOTE — Letter (Signed)
Summary: Custom - Delinquent Coumadin 2  [facility]  [address] [place] [address]   Phone: [phone]  Fax: [phone]     July 13, 2009 MRN: [id number]   [name] [address] [place], [place]  [address]   Dear Mr. SALING,  We have attempted to contact you by phone and letter on multiple occasions to contact our office for important blood work associated with the blood thinner, warfarin (Coumadin).  Warfarin is a very important drug that can cause life threatening side effects including, bleeding, and thus requires close laboratory monitoring.  We are unable to accept responsibility for blood thinner-related health problems you may develop because you have not followed our recommendations for appropriate monitoring.  These may include abnormal bleeding occurrences and/or development of blood clots (stroke, heart attack, blood clots in legs or lungs, etc.).  We need for you to contact this office at the number listed above to schedule and complete this very important blood work.  Thank you for your assistance in this urgent matter.  Sincerely, Vashti Hey, RN [facility] Team

## 2010-04-17 NOTE — Letter (Signed)
Summary: Appointment - Missed  [facility]  [address], [place] [address]   Phone: [phone]  Fax: [phone]     January 25, 2010 MRN: [id number]   [name] [address] [place], [place]  [address]   Dear [name],  Our records indicate you missed your appointment on   01/25/10 [facility]                It is very important that we reach you to reschedule this appointment. We look forward to participating in your health care needs. Please contact us at the number listed above at your earliest convenience to reschedule this appointment.     Sincerely,    Glass blower/designer

## 2010-04-17 NOTE — Medication Information (Signed)
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 no    Recent/future dental: no  Any missed doses?: yes     Details: pt held a couple of doses when he thought blood got to thin  Is patient compliant with meds? no     Details: does not keep appts for INR checks.  Have discussed risks of this with pt. and reminder lettters sent.   Anticoagulation Management History:      The patient is taking warfarin and comes in today for a routine follow up visit.  Positive risk factors for bleeding include an age of 75 years or older.  The bleeding index is 'intermediate risk'.  Positive CHADS2 values include Age > 55 years old.  The start date was 05/12/1995.  Anticoagulation responsible provider: Diona Browner MD, Remi Deter.  INR POC: 3.5.  Cuvette Lot#: [id number].    Anticoagulation Management Assessment/Plan:      The target INR is 3 - 3.5.  The next INR is due 09/11/2009.  Anticoagulation instructions were given to patient.  Results were reviewed/authorized by Vashti Hey, RN.  He was notified by Vashti Hey RN.         Prior Anticoagulation Instructions: INR 2.4 Take coumadin 1 1/2 tablets tonight then resume 1 tablet once daily   Current Anticoagulation Instructions: INR 3.5 Continue coumadin 5mg  once daily

## 2010-04-17 NOTE — Medication Information (Signed)
Summary: ccr  Anticoagulant Therapy  Managed by: Vashti Hey, RN PCP: Dr. 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 [place] Site: [place] INR POC 1.2  Dietary changes: no    Health status changes: no    Bleeding/hemorrhagic complications: no    Recent/future hospitalizations: no    Any changes in medication regimen? no    Recent/future dental: no  Any missed doses?: yes     Details: Has missed doses.  Doesn't know how many.  Stressed important of taking coumadin regularly as instructed.  Increased risk of stroke explained.  Is patient compliant with meds? yes       Allergies: No Known Drug Allergies  Anticoagulation Management History:      The patient is taking warfarin and comes in today for a routine follow up visit.  Positive risk factors for bleeding include an age of 75 years or older.  The bleeding index is 'intermediate risk'.  Positive CHADS2 values include History of HTN and Age > 39 years old.  The start date was 05/12/1995.  Anticoagulation responsible provider: Dietrich Pates MD, Molly Maduro.  INR POC: 1.2.  Cuvette Lot#: [id number].    Anticoagulation Management Assessment/Plan:      The patient's current anticoagulation dose is Warfarin sodium 5 mg tabs: take as directed by Coumadin Clinic.  The target INR is 3 - 3.5.  The next INR is due 01/04/2010.  Anticoagulation instructions were given to patient.  Results were reviewed/authorized by Vashti Hey, RN.  He was notified by Vashti Hey RN.         Prior Anticoagulation Instructions: INR 2.8 Pt has been taking 5mg  once daily  He did not decrease dose as ordered last OV Continue coumadin 5mg  once daily   Current Anticoagulation Instructions: INR 1.2 Take coumadin 2 tablets tonight and tomorrow night then resume 1 tablet once daily

## 2010-04-19 NOTE — Medication Information (Signed)
Summary: ccr 1:00/tmj      Allergies Added: NKDA Anticoagulant Therapy  Managed by: Teressa Lower, RN PCP: Dr. 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 no    Recent/future dental: no  Any missed doses?: no       Is patient compliant with meds? no     Details: unsure at this point pt was lost to follow up since October 2011   Current Medications (verified): 1)  Warfarin Sodium 5 Mg Tabs (Warfarin Sodium) .... Take As Directed By Coumadin Clinic 2)  Mag-Ox 400 400 Mg Tabs (Magnesium Oxide) .... Take 1 Tablet By Mouth Once A Day 3)  Furosemide 40 Mg Tabs (Furosemide) .... Take 1 Tablet By Mouth Once A Day 4)  Cardizem 120 Mg Tabs (Diltiazem Hcl) .... Take 1 Tablet By Mouth Once Daily 5)  Poly-Iron 150 Forte 150-25-1 Mg-Mcg-Mg Caps (Iron Polysacch Cmplx-B12-Fa) .... Take 1 Tablet By Mouth Once A Day 6)  Diltiazem Hcl Er Beads 120 Mg Xr24h-Cap (Diltiazem Hcl Er Beads) .... Take One Capsule By Mouth Daily  Allergies (verified): No Known Drug Allergies  Anticoagulation Management History:      The patient is taking warfarin and comes in today for a routine follow up visit.  Positive risk factors for bleeding include an age of 75 years or older.  The bleeding index is 'intermediate risk'.  Positive CHADS2 values include History of HTN and Age > 65 years old.  The start date was 05/12/1995.  Anticoagulation responsible provider: Dietrich Pates MD, Molly Maduro.  INR POC: >8.0.  Cuvette Lot#: [id number].  Exp: 03/2011.    Anticoagulation Management Assessment/Plan:      The patient's current anticoagulation dose is Warfarin sodium 5  mg tabs: take as directed by Coumadin Clinic.  The target INR is 3 - 3.5.  The next INR is due 01/25/2010.  Anticoagulation instructions were given to patient.  Results were reviewed/authorized by Teressa Lower, RN.  He was notified by Teressa Lower RN.         Prior Anticoagulation Instructions: INR 4.1 Hold coumadin tonight then resume 1 tablet once daily   Current Anticoagulation Instructions: INR >8.0 TODAY DO NOT TAKE ANY WARFARIN UNTIL YOUR RETURN ON MONDAY

## 2010-04-19 NOTE — Miscellaneous (Signed)
Summary: LABS CBCD,CMP,LIPIDS,09/22/2009  Clinical Lists Changes  Observations: Added new observation of CALCIUM: 9.0 mg/dL (95/62/1308 65:78) Added new observation of ALBUMIN: 4.0 g/dL (46/96/2952 84:13) Added new observation of PROTEIN, TOT: 7.4 g/dL (24/40/1027 25:36) Added new observation of SGPT (ALT): 13 units/L (09/22/2009 10:53) Added new observation of SGOT (AST): 21 units/L (09/22/2009 10:53) Added new observation of ALK PHOS: 102 units/L (09/22/2009 10:53) Added new observation of CREATININE: 1.15 mg/dL (64/40/3474 25:95) Added new observation of BUN: 19 mg/dL (63/87/5643 32:95) Added new observation of BG RANDOM: 86 mg/dL (18/84/1660 63:01) Added new observation of CO2 PLSM/SER: 25 meq/L (09/22/2009 10:53) Added new observation of CL SERUM: 103 meq/L (09/22/2009 10:53) Added new observation of K SERUM: 4.1 meq/L (09/22/2009 10:53) Added new observation of NA: 140 meq/L (09/22/2009 10:53) Added new observation of LDL: 142 mg/dL (60/12/9321 55:73) Added new observation of HDL: 56 mg/dL (22/04/5425 06:23) Added new observation of TRIGLYC TOT: 104 mg/dL (76/28/3151 76:16) Added new observation of CHOLESTEROL: 219 mg/dL (07/37/1062 69:48) Added new observation of ABSOLUTE BAS: 0.0 K/uL (09/22/2009 10:53) Added new observation of BASOPHIL %: 1 % (09/22/2009 10:53) Added new observation of EOS ABSLT: 0.2 K/uL (09/22/2009 10:53) Added new observation of % EOS AUTO: 5 % (09/22/2009 10:53) Added new observation of ABSOLUTE MON: 0.5 K/uL (09/22/2009 10:53) Added new observation of MONOCYTE %: 12 % (09/22/2009 10:53) Added new observation of ABS LYMPHOCY: 1.5 K/uL (09/22/2009 10:53) Added new observation of LYMPHS %: 38 % (09/22/2009 10:53) Added new observation of PLATELETK/UL: 155 K/uL (09/22/2009 10:53) Added new observation of RDW: 15.1 % (09/22/2009 10:53) Added new observation of MCHC RBC: 33.4 g/dL (54/62/7035 00:93) Added new observation of MCV: 85.6 fL (09/22/2009  10:53) Added new observation of HCT: 42.8 % (09/22/2009 10:53) Added new observation of HGB: 14.3 g/dL (81/82/9937 16:96) Added new observation of RBC M/UL: 5.00 M/uL (09/22/2009 10:53) Added new observation of WBC COUNT: 4.0 10*3/microliter (09/22/2009 10:53)

## 2010-04-25 NOTE — Progress Notes (Signed)
   Phone Note Other Incoming   Caller: pt in office Summary of Call: S:pt in office for inr check 4.8 today B: urine culture grew >100,000 colonies A: blood in urine remains, denies any s/s uti   R: no antibiotics per Dr. Dietrich Pates Initial call taken by: Teressa Lower RN,  April 16, 2010 9:25 AM     Appended Document:   04/16/10 Repeat urine culture shows greater than 100,000 colonies with multiple species.  Patient continues to be asymptomatic and has no fever.  No antibiotic treatment is warranted at present.  Urinalysis will be repeated after INR is once again therapeutic.  Manchester Bing, M.D.

## 2010-04-25 NOTE — Assessment & Plan Note (Signed)
Summary: 7 mth f/u per checkout on 09/11/09/tg  Medications Added AMLODIPINE BESYLATE 2.5 MG TABS (AMLODIPINE BESYLATE) Take one tablet by mouth daily      Allergies Added: NKDA  Visit Type:  Follow-up Primary Provider:  Dr. Regino Schultze   History of Present Illness: [name] returns to the office as scheduled for continued assessment and treatment of atrial fibrillation with concomitant conduction system disease and rheumatic mitral valve disease.  Since his last visit, he has continued to do outstandingly well.  He denies dyspnea, orthopnea, PND, pedal edema, palpitations or syncope.  Activity is fairly limited, but he is able to do the things that he cares to accomplish.  He has not required urgent medical care nor seen his primary care physician in some time.  EKG  Procedure date:  04/13/2010  Findings:      Rhythm Strip  Atrial fibrillation with a slow ventricular response Frequent PVCs-doubt aberrancy Heart rate of 52 bpm   Current Medications (verified): 1)  Warfarin Sodium 5 Mg Tabs (Warfarin Sodium) .... Take As Directed By Coumadin Clinic 2)  Mag-Ox 400 400 Mg Tabs (Magnesium Oxide) .... Take 1 Tablet By Mouth Once A Day 3)  Furosemide 40 Mg Tabs (Furosemide) .... Take 1 Tablet By Mouth Once A Day 4)  Poly-Iron 150 Forte 150-25-1 Mg-Mcg-Mg Caps (Iron Polysacch Cmplx-B12-Fa) .... Take 1 Tablet By Mouth Once A Day 5)  Diltiazem Hcl Er Beads 120 Mg Xr24h-Cap (Diltiazem Hcl Er Beads) .... Take One Capsule By Mouth Daily 6)  Amlodipine Besylate 2.5 Mg Tabs (Amlodipine Besylate) .... Take One Tablet By Mouth Daily  Allergies (verified): No Known Drug Allergies  Comments:  Nurse/Medical Assistant: patient didnt bring meds or list will call [facility] for med list also  reviewed last ov for meds  Past History:  PMH, FH, and Social History reviewed and updated.  Review of Systems       See history of present illness.  Vital Signs:  Patient profile:   77  year old male Weight:      118 pounds BMI:     18.01 Pulse rate:   62 / minute BP sitting:   134 / 76  (left arm)  Vitals Entered By: Dreama Saa, CNA (April 13, 2010 1:12 PM)  Physical Exam  General:  Thin; well developed; no acute distress:   HEENT-poor dentition Neck-No JVD; no carotid bruits: Lungs-No tachypnea, no rales; no rhonchi; no wheezes Cardiovascular: split S1 vs. fourth heart sound and normal S2; grade 2/6 rumbling diastolic murmur only heard over the apex, which is moderately displaced laterally and is forceful; opening snap present; grade 1-2/6 nearly holosystolic murmur at the left sternal border Abdomen-BS normal; soft and non-tender without masses or organomegaly:  Musculoskeletal-No deformities, no cyanosis or clubbing: Neurologic-Normal cranial nerves; symmetric strength and tone:  Skin-Warm, extremely dry over the lower extremities Extremities-Nl distal pulses; no edema:     Impression & Recommendations:  Problem # 1:  RHEUMATIC MITRAL STENOSIS (ICD-394.0) Althoough difficult to believe, patient remains asymptomatic despite an episode of pulmonary edema some years ago.  Mitral valve anatomy and function will be reassessed with echocardiography, which was planned last year but never carried out.  Problem # 2:  ANTICOAGULATION (ICD-V58.61) INR today is greater than 8 according to our point-of-care test.  Patient's most recent INR was obtained 3 months ago.  In the interim, a different brand of Coumadin was substituted by his pharmacy.  We will need to more diligently follow his  anticoagulation or consider a switch to dabigatran.  Dosage will be adjusted downward to achieve a therapeutic INR.  Problem # 3:  SICK SINUS SYNDROME (ICD-427.81) Heart rate has been quite slow of late with no change after discontinuation of beta blockers.  Diltiazem will now be stopped and heart rate reassessed with a rhythm strip in 2 weeks by the cardiology nurses.  Problem # 4:   HYPERTENSION, HX OF (ICD-V12.50) Blood pressures have been variable in the past, but [name] probably has mild hypertension.  With discontinuation of diltiazem, amlodipine 2.5 mg will be added and blood pressure reassessed in 2 weeks.  I will see this nice gentleman again in 6 months.  Other Orders: T-Comprehensive Metabolic Panel [id number]) T-CBC w/Diff [id number]) T-Urinalysis ([id number]) T-Culture, Urine ([id number]) 2-D Echocardiogram (2D Echo)  Patient Instructions: 1)  Your physician recommends that you schedule a follow-up appointment in: 6 months 2)  Your physician recommends that you return for lab work in: today 3)  Your physician has recommended you make the following change in your medication: stop diltiazem, amlodipine 2.5mg  daily 4)  You have been referred to nurse visit 1 month rhythm strip 5)  Your physician has requested that you have an echocardiogram.  Echocardiography is a painless test that uses sound waves to create images of your heart. It provides your doctor with information about the size and shape of your heart and how well your heart's chambers and valves are working.  This procedure takes approximately one hour. There are no restrictions for this procedure. Prescriptions: AMLODIPINE BESYLATE 2.5 MG TABS (AMLODIPINE BESYLATE) Take one tablet by mouth daily  #30 x 3   Entered by:   Teressa Lower RN   Authorized by:   Kathlen Brunswick, MD, [facility]   Signed by:   Teressa Lower RN on 04/13/2010   Method used:   Electronically to        [facility] (retail)       [address]       [place], [place]  [address]       Ph: [phone]       Fax: [phone]   RxID:   [id number]

## 2010-04-25 NOTE — Medication Information (Signed)
Summary: protime/tg  Anticoagulant Therapy  Managed by: Vashti Hey, RN PCP: Dr. 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 no    Recent/future dental: no  Any missed doses?: yes     Details: coumadin has been on hold since Friday due to elevated INR  Is patient compliant with meds? no     Details: forgetful   Not complaint in keeping INR appts   Allergies: No Known Drug Allergies  Anticoagulation Management History:      The patient is taking warfarin and comes in today for a routine follow up visit.  Positive risk factors for bleeding include an age of 75 years or older.  The bleeding index is 'intermediate risk'.  Positive CHADS2 values include History of HTN and Age > 39 years old.  The start date was 05/12/1995.  His last INR was 5.81.  Anticoagulation responsible provider: Dietrich Pates MD, Molly Maduro.  INR POC: 4.9.  Cuvette Lot#: [id number].  Exp: 03/2011.    Anticoagulation Management Assessment/Plan:      The patient's current anticoagulation dose is Warfarin sodium 5 mg tabs: take as directed by Coumadin Clinic.  The target INR is 3 - 3.5.  The next INR is due 04/30/2010.  Anticoagulation instructions were given to patient.  Results were reviewed/authorized by Vashti Hey, RN.  He was notified by Vashti Hey RN.         Prior Anticoagulation Instructions: INR >8.0 TODAY DO NOT TAKE ANY WARFARIN UNTIL YOUR RETURN ON MONDAY  Current Anticoagulation Instructions: INR 4.9 Hold coumadin tonight then decrease dose to 5mg  once daily except 2.5mg  on Tuesdays and Fridays

## 2010-04-30 ENCOUNTER — Encounter (INDEPENDENT_AMBULATORY_CARE_PROVIDER_SITE_OTHER): Payer: Self-pay | Admitting: *Deleted

## 2010-05-09 NOTE — Letter (Signed)
Summary: Appointment - Missed  [facility] [place]  [address], [place] [address]   Phone: [phone]  Fax: [phone]     April 30, 2010 MRN: [id number]   [name] [address] [place], [place]  [address]   Dear Mr. QUEVEDO,  Our records indicate you missed your appointment on     04/30/10 COU[facility]ADIN CLINIC                     It is very important that we reach you to reschedule this appointment. We look forward to participating in your health care needs. Please contact us at the number listed above at your earliest convenience to reschedule this appointment.     Sincerely,    Glass blower/designer

## 2010-05-14 ENCOUNTER — Ambulatory Visit: Payer: Self-pay

## 2010-05-16 ENCOUNTER — Encounter (INDEPENDENT_AMBULATORY_CARE_PROVIDER_SITE_OTHER): Payer: Self-pay | Admitting: *Deleted

## 2010-05-16 ENCOUNTER — Ambulatory Visit: Payer: Self-pay

## 2010-05-17 ENCOUNTER — Encounter: Payer: Self-pay | Admitting: Cardiology

## 2010-05-17 ENCOUNTER — Encounter (INDEPENDENT_AMBULATORY_CARE_PROVIDER_SITE_OTHER): Payer: MEDICARE

## 2010-05-17 DIAGNOSIS — I4891 Unspecified atrial fibrillation: Secondary | ICD-10-CM

## 2010-05-17 DIAGNOSIS — Z7901 Long term (current) use of anticoagulants: Secondary | ICD-10-CM

## 2010-05-24 NOTE — Letter (Signed)
Summary: Appointment - Missed  [facility] [place]  [address] [place] [address]   Phone: [phone]  Fax: [phone]     May 16, 2010 MRN: [id number]   [name] [address] [place], [place]  [address]   Dear [name],  Our records indicate you missed your appointment on          05/16/10 COUMADIN CLINIC AND NURSE VISIT                       It is very important that we reach you to reschedule this appointment. We look forward to participating in your health care needs. Please contact us at the number listed above at your earliest convenience to reschedule this appointment.     Sincerely,    Glass blower/designer

## 2010-05-24 NOTE — Medication Information (Signed)
Summary: [facility]  Anticoagulant Therapy  Managed by: Vashti Hey, RN PCP: Dr. 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 no    Recent/future dental: no  Any missed doses?: no       Is patient compliant with meds? yes       Allergies: No Known Drug Allergies  Anticoagulation Management History:      The patient is taking warfarin and comes in today for a routine follow up visit.  Positive risk factors for bleeding include an age of 75 years or older.  The bleeding index is 'intermediate risk'.  Positive CHADS2 values include History of HTN and Age > 64 years old.  The start date was 05/12/1995.  His last INR was 5.81.  Anticoagulation responsible provider: Diona Browner MD, Remi Deter.  INR POC: 2.9.  Cuvette Lot#: [id number].  Exp: 03/2011.    Anticoagulation Management Assessment/Plan:      The patient's current anticoagulation dose is Warfarin sodium 5 mg tabs: take as directed by [facility].  The target INR is 3 - 3.5.  The next INR is due 06/14/2010.  Anticoagulation instructions were given to patient.  Results were reviewed/authorized by Vashti Hey, RN.  He was notified by Vashti Hey RN.         Prior Anticoagulation Instructions: INR 4.9 Hold coumadin tonight then decrease dose to 5mg  once daily except 2.5mg  on Tuesdays and Fridays  Current Anticoagulation Instructions: INR 2.9 Take couamdin 1 1/2 tablets tonight then resume 1 tablet once daily except 1/2 tablet on Tuesdays and Fridays

## 2010-05-26 ENCOUNTER — Encounter: Payer: Self-pay | Admitting: Cardiology

## 2010-05-26 ENCOUNTER — Encounter (INDEPENDENT_AMBULATORY_CARE_PROVIDER_SITE_OTHER): Payer: Self-pay | Admitting: *Deleted

## 2010-05-29 NOTE — Miscellaneous (Signed)
Summary: Orders Update  Clinical Lists Changes  Orders: Added new Test order of T-Urinalysis ([id number]) - Signed 

## 2010-05-29 NOTE — Letter (Signed)
Summary: [place] [employer] [facility], agricultural at [facility]  [address], [place] [address]   Phone: [phone]  Fax: [phone]      May 26, 2010 MRN: [id number]   [name] [address] [place], [place]  [address]   Dear Mr. YO,  Your test ordered by Selena Batten has been reviewed by your physician (or physician assistant) and was found to be  stable. Your physician (or physician assistant) felt no changes were needed at this time.  __x__ Echocardiogram  ____ Cardiac Stress Test  ____ Lab Work  ____ Peripheral vascular study of arms, legs or neck  ____ CT scan or X-ray  ____ Lung or Breathing test  ____ Other:  No change in medical treatment at this time, per Dr. Dietrich Pates.  We would like for you to have a repeat urine test.  Enclosed is the lab work for you to take to [facility].  Thank you, Chesnee Floren Allyne Gee RN    Pace Bing, MD, Lenise Arena.C.Gaylord Shih, MD, F.A.C.C Lewayne Bunting, MD, F.A.C.C Nona Dell, MD, F.A.C.C Charlton Haws, MD, Lenise Arena.C.C

## 2010-06-01 LAB — CBC
Hemoglobin: 12.1 g/dL — ABNORMAL LOW (ref 13.0–17.0)
MCH: 29 pg (ref 26.0–34.0)
MCV: 87.1 fL (ref 78.0–100.0)
RBC: 4.16 MIL/uL — ABNORMAL LOW (ref 4.22–5.81)

## 2010-06-01 LAB — DIFFERENTIAL
Eosinophils Absolute: 0.2 10*3/uL (ref 0.0–0.7)
Eosinophils Relative: 4 % (ref 0–5)
Lymphs Abs: 0.6 10*3/uL — ABNORMAL LOW (ref 0.7–4.0)
Monocytes Absolute: 0.5 10*3/uL (ref 0.1–1.0)
Monocytes Relative: 10 % (ref 3–12)

## 2010-06-12 ENCOUNTER — Encounter: Payer: Self-pay | Admitting: Cardiology

## 2010-06-12 DIAGNOSIS — I4891 Unspecified atrial fibrillation: Secondary | ICD-10-CM | POA: Insufficient documentation

## 2010-06-12 DIAGNOSIS — Z7901 Long term (current) use of anticoagulants: Secondary | ICD-10-CM | POA: Insufficient documentation

## 2010-06-12 DIAGNOSIS — I749 Embolism and thrombosis of unspecified artery: Secondary | ICD-10-CM | POA: Insufficient documentation

## 2010-06-14 ENCOUNTER — Encounter: Payer: Self-pay | Admitting: *Deleted

## 2010-06-14 ENCOUNTER — Encounter: Payer: MEDICARE | Admitting: *Deleted

## 2010-06-16 ENCOUNTER — Other Ambulatory Visit: Payer: Self-pay | Admitting: Cardiology

## 2010-06-27 ENCOUNTER — Ambulatory Visit (INDEPENDENT_AMBULATORY_CARE_PROVIDER_SITE_OTHER): Payer: MEDICARE | Admitting: *Deleted

## 2010-06-27 DIAGNOSIS — I4891 Unspecified atrial fibrillation: Secondary | ICD-10-CM

## 2010-06-27 DIAGNOSIS — Z7901 Long term (current) use of anticoagulants: Secondary | ICD-10-CM

## 2010-06-27 DIAGNOSIS — I749 Embolism and thrombosis of unspecified artery: Secondary | ICD-10-CM

## 2010-06-27 LAB — BRAIN NATRIURETIC PEPTIDE
Pro B Natriuretic peptide (BNP): 454 pg/mL — ABNORMAL HIGH (ref 0.0–100.0)
Pro B Natriuretic peptide (BNP): 925 pg/mL — ABNORMAL HIGH (ref 0.0–100.0)

## 2010-06-27 LAB — CBC
HCT: 37.9 % — ABNORMAL LOW (ref 39.0–52.0)
HCT: 37.5 % — ABNORMAL LOW (ref 39.0–52.0)
Hemoglobin: 12.9 g/dL — ABNORMAL LOW (ref 13.0–17.0)
MCHC: 33.9 g/dL (ref 30.0–36.0)
MCHC: 34.3 g/dL (ref 30.0–36.0)
MCV: 86.8 fL (ref 78.0–100.0)
MCV: 86.1 fL (ref 78.0–100.0)
MCV: 86.3 fL (ref 78.0–100.0)
Platelets: 157 10*3/uL (ref 150–400)
Platelets: 149 10*3/uL — ABNORMAL LOW (ref 150–400)
Platelets: 152 K/uL (ref 150–400)
RBC: 4.35 MIL/uL (ref 4.22–5.81)
RBC: 4.78 MIL/uL (ref 4.22–5.81)
RDW: 14.7 % (ref 11.5–15.5)
RDW: 14.8 % (ref 11.5–15.5)
RDW: 15.1 % (ref 11.5–15.5)
WBC: 3.7 K/uL — ABNORMAL LOW (ref 4.0–10.5)
WBC: 4 10*3/uL (ref 4.0–10.5)

## 2010-06-27 LAB — CK TOTAL AND CKMB (NOT AT ARMC)
CK, MB: 1.9 ng/mL (ref 0.3–4.0)
Relative Index: INVALID (ref 0.0–2.5)
Total CK: 90 U/L (ref 7–232)

## 2010-06-27 LAB — DIFFERENTIAL
Basophils Absolute: 0 10*3/uL (ref 0.0–0.1)
Basophils Absolute: 0 K/uL (ref 0.0–0.1)
Basophils Absolute: 0.1 K/uL (ref 0.0–0.1)
Basophils Relative: 1 % (ref 0–1)
Basophils Relative: 1 % (ref 0–1)
Basophils Relative: 2 % — ABNORMAL HIGH (ref 0–1)
Eosinophils Absolute: 0.2 10*3/uL (ref 0.0–0.7)
Eosinophils Absolute: 0.1 10*3/uL (ref 0.0–0.7)
Eosinophils Absolute: 0.1 K/uL (ref 0.0–0.7)
Eosinophils Absolute: 0.1 K/uL (ref 0.0–0.7)
Eosinophils Relative: 4 % (ref 0–5)
Eosinophils Relative: 2 % (ref 0–5)
Eosinophils Relative: 3 % (ref 0–5)
Lymphocytes Relative: 20 % (ref 12–46)
Lymphocytes Relative: 21 % (ref 12–46)
Lymphs Abs: 0.9 10*3/uL (ref 0.7–4.0)
Lymphs Abs: 0.8 K/uL (ref 0.7–4.0)
Lymphs Abs: 0.8 K/uL (ref 0.7–4.0)
Monocytes Absolute: 0.7 10*3/uL (ref 0.1–1.0)
Monocytes Absolute: 0.4 K/uL (ref 0.1–1.0)
Monocytes Absolute: 0.6 K/uL (ref 0.1–1.0)
Monocytes Relative: 11 % (ref 3–12)
Monocytes Relative: 16 % — ABNORMAL HIGH (ref 3–12)
Neutro Abs: 2.2 K/uL (ref 1.7–7.7)
Neutro Abs: 2.6 K/uL (ref 1.7–7.7)
Neutrophils Relative %: 58 % (ref 43–77)
Neutrophils Relative %: 61 % (ref 43–77)
Neutrophils Relative %: 65 % (ref 43–77)

## 2010-06-27 LAB — PROTIME-INR
INR: 2.6 — ABNORMAL HIGH (ref 0.00–1.49)
INR: 2.1 — ABNORMAL HIGH (ref 0.00–1.49)
INR: 2.4 — ABNORMAL HIGH (ref 0.00–1.49)
INR: 2.8 — ABNORMAL HIGH (ref 0.00–1.49)
Prothrombin Time: 29.4 s — ABNORMAL HIGH (ref 11.6–15.2)
Prothrombin Time: 27.5 seconds — ABNORMAL HIGH (ref 11.6–15.2)
Prothrombin Time: 31.9 seconds — ABNORMAL HIGH (ref 11.6–15.2)

## 2010-06-27 LAB — GLUCOSE, CAPILLARY
Glucose-Capillary: 109 mg/dL — ABNORMAL HIGH (ref 70–99)
Glucose-Capillary: 85 mg/dL (ref 70–99)
Glucose-Capillary: 79 mg/dL (ref 70–99)
Glucose-Capillary: 84 mg/dL (ref 70–99)
Glucose-Capillary: 95 mg/dL (ref 70–99)
Glucose-Capillary: 96 mg/dL (ref 70–99)

## 2010-06-27 LAB — BASIC METABOLIC PANEL
BUN: 15 mg/dL (ref 6–23)
BUN: 17 mg/dL (ref 6–23)
CO2: 29 mEq/L (ref 19–32)
Chloride: 104 mEq/L (ref 96–112)
Chloride: 106 mEq/L (ref 96–112)
Creatinine, Ser: 1.03 mg/dL (ref 0.4–1.5)
Glucose, Bld: 102 mg/dL — ABNORMAL HIGH (ref 70–99)
Potassium: 3.9 mEq/L (ref 3.5–5.1)

## 2010-06-27 LAB — BASIC METABOLIC PANEL WITH GFR
BUN: 17 mg/dL (ref 6–23)
BUN: 19 mg/dL (ref 6–23)
CO2: 27 meq/L (ref 19–32)
CO2: 29 meq/L (ref 19–32)
Calcium: 8.6 mg/dL (ref 8.4–10.5)
Calcium: 9.3 mg/dL (ref 8.4–10.5)
Chloride: 105 meq/L (ref 96–112)
Chloride: 108 meq/L (ref 96–112)
Creatinine, Ser: 1.21 mg/dL (ref 0.4–1.5)
Creatinine, Ser: 1.27 mg/dL (ref 0.4–1.5)
GFR calc non Af Amer: 54 mL/min — ABNORMAL LOW
GFR calc non Af Amer: 58 mL/min — ABNORMAL LOW
Glucose, Bld: 115 mg/dL — ABNORMAL HIGH (ref 70–99)
Glucose, Bld: 92 mg/dL (ref 70–99)
Potassium: 3.7 meq/L (ref 3.5–5.1)
Potassium: 4.1 meq/L (ref 3.5–5.1)
Sodium: 136 meq/L (ref 135–145)
Sodium: 142 meq/L (ref 135–145)

## 2010-06-27 LAB — POCT INR: INR: 5.4

## 2010-06-27 LAB — POCT CARDIAC MARKERS
CKMB, poc: 2.5 ng/mL (ref 1.0–8.0)
Troponin i, poc: <0.05 ng/mL (ref 0.00–0.09)

## 2010-06-27 LAB — APTT: aPTT: 57 seconds — ABNORMAL HIGH (ref 24–37)

## 2010-06-27 LAB — TSH: TSH: 1.614 u[IU]/mL (ref 0.350–4.500)

## 2010-06-27 LAB — TROPONIN I

## 2010-07-16 ENCOUNTER — Ambulatory Visit: Payer: MEDICARE | Admitting: Cardiology

## 2010-07-16 ENCOUNTER — Encounter: Payer: MEDICARE | Admitting: *Deleted

## 2010-07-16 ENCOUNTER — Encounter: Payer: Self-pay | Admitting: Cardiology

## 2010-07-31 NOTE — Procedures (Signed)
NAME:  [name], [name]                ACCOUNT NO.:  [id number]   MEDICAL RECORD NO.:  [id number]          PATIENT TYPE:  OUT   LOCATION:  RAD                           FACILITY:  [facility]   PHYSICIAN:  Gerrit Friends. Dietrich Pates, MD, FACCDATE OF BIRTH:  [birth year]   DATE OF PROCEDURE:  05/01/2007  DATE OF DISCHARGE:                                ECHOCARDIOGRAM   CLINICAL DATA:  An 77 year old gentleman with rheumatic mitral valve  disease.  1. Technically adequate echocardiographic study.  2. Moderate-to-marked left atrial enlargement; mild spontaneous      echocardiographic contrast in the left atrium.  3. Moderate right atrial enlargement.  Normal right ventricular size      and function; no RVH.  4. Very mild thickening of a trileaflet aortic valve, which has a      slightly rheumatic appearance but no stenosis and minimal      insufficiency.  5. Normal pulmonic valve and proximal pulmonary artery.  6. Normal tricuspid valve; mild-to-moderate regurgitation; very mild      elevation in RV systolic pressure.  7. Moderate thickening of a clearly rheumatic mitral valve; very mild      insufficiency; moderate-to-severe stenosis with a valve area of      approximately 1 square centimeter.  8. Normal left ventricular size; mild hypertrophy; normal regional and      global systolic function.  9. Normal IVC.  10.Comparison with prior study of August 29, 2004:  No significant      interval change except that the magnitude of tricuspid      regurgitation no longer appears severe.      Gerrit Friends. Dietrich Pates, MD, [facility]  Electronically Signed     RMR/MEDQ  D:  05/02/2007  T:  05/04/2007  Job:  [id number]

## 2010-07-31 NOTE — Letter (Signed)
December 31, 2007    Patrica Duel, M.D.  [address]  [place], [place] [address]   RE:  MARK, HASSEY  MRN:  [id number]  /  DOB:  04/24/1924   Dear Loraine Leriche:   Mr. Dunnavant returns to the office for continued assessment and  treatment of rheumatic heart disease and chronic atrial fibrillation.  Since I last saw him 8 months ago, he has continued to do well.  He does  note early a.m. symptoms that he describes as dyspnea, but they do not  clearly represent PND or breathlessness.  He does sit up on the side of  the bed and feels better, but he is able to sleep flat without  difficulty.  He has no exertional symptoms.  He notes no palpitations.   CURRENT MEDICATIONS:  1. Warfarin as directed with stable and therapeutic anticoagulation.  2. Magnesium oxide 400 mg daily.  3. Pepcid 20 mg b.i.d.  4. Furosemide 40 mg daily.  5. Diltiazem 120 mg daily.  6. Niferex 150 mg daily.  7. Metoprolol 25 mg b.i.d.   PHYSICAL EXAMINATION:  GENERAL:  On exam, very pleasant thin gentleman  in no acute distress.  VITAL SIGNS:  The weight is 128, 2 pounds more than at his last visit.  Heart rate 64 and irregular, blood pressure 120/70.  HEENT:  Poor dentition.  NECK:  No jugular venous distention; no carotid bruits.  LUNGS:  Clear.  CARDIAC:  PMI displaced to the anterior axillary line with sustained  impulse; increased first heart sounds; normal second heart sounds;  prominent opening snap; grade 2/6 holodiastolic rumble heard in the  apex; grade 1/6 late systolic murmur heard at the left sternal border.  ABDOMEN:  Soft and nontender; no organomegaly.  EXTREMITIES:  No edema.  SKIN:  Old scarring over the right chest.   An echocardiogram performed 6 months ago showed moderate to severe  mitral stenosis, moderate to marked left atrial enlargement but only  mild pulmonary hypertension, moderate tricuspid regurgitation and normal  right ventricular size and function.  A CBC earlier  this year was normal  as was a chemistry profile.  Stools for Hemoccult testing were requested  at his last visit but not return.   IMPRESSION:  Mr. Veal continues to be virtually asymptomatic with  substantial rheumatic mitral stenosis.  There is no significant  involvement of other heart valves.  He has only mild mitral  regurgitation and probably can undergo balloon valvuloplasty when  required.  In the absence of symptoms are pulmonary hypertension, this  will be deferred.  I will continue to see this nice gentleman every 6  months.  A chemistry profile, CBC and stool for Hemoccult testing we  will be obtained.    Sincerely,      Gerrit Friends. Dietrich Pates, MD, [facility]  Electronically Signed    RMR/MedQ  DD: 12/31/2007  DT: 01/01/2008  Job #: [id number]

## 2010-07-31 NOTE — Letter (Signed)
April 29, 2007    Patrica Duel, M.D.  [address]  [place], [place] [address]   RE:  [name], [name]  MRN:  [id number]  /  DOB:  30-Jan-1925   Dear Loraine Leriche:   [name] returns to the office somewhat beyond his anticipated  visit.  Nonetheless, he has done generally well.  He has noted some  increased back pain of late.  He has been seen by a dentist on a number  of occasions, but does not understand why they have not done a  significant number of extractions.  He notes one episode of dyspnea most  nights where he awakens and feels somewhat short of breath.  It is  unclear whether this is true air hunger.  He lies back down and returns  to sleep for the rest the night and awakens normally in the morning.  He  has no orthopnea and sleeps on one pillow.  He has been less than  perfect in compliance with [facility]in [facility], but INR is therapeutic  today.   His other medications include magnesium oxide 400 mg daily, Pepcid 20 mg  b.i.d., furosemide 40 mg daily, Diltiazem 120 mg daily, Niferex 150 mg  daily, metoprolol 25 mg b.i.d.   EXAM:  Thin, pleasant and gentleman in no acute distress.  The weight is 126, blood pressure 115/80, heart rate 62 and irregular,  respirations 14.  HEENT:  Poor dentition with marked a gum recession.  NECK:  No jugular venous distention; normal carotid upstrokes without  bruits.  LUNGS:  Clear.  CARDIAC:  Split first heart sound; normal second heart sounds; prominent  opening snap; cannot appreciate a diastolic murmur.  ABDOMEN:  Soft and nontender; no organomegaly.  EXTREMITIES:  Trace edema.   IMPRESSION:  [name] is doing rather well overall.  I suspect his  symptoms do not represent true PND, but will check a chest x-ray and  repeat echocardiogram.  Some day, he will require intervention for his  mitral valve disease.  For now we will continue attendance in  anticoagulation clinic, monitor laboratories, and stool for hemoccult  testing, and plan a return office visit in 8 months.    Sincerely,      Billy Friends. Dietrich Pates, MD, [facility]  Electronically Signed    RMR/MedQ  DD: 04/29/2007  DT: 04/30/2007  Job #: [id number]

## 2010-07-31 NOTE — H&P (Signed)
NAME:  [name], [name] NO.:  [id number]   MEDICAL RECORD NO.:  [id number]         PATIENT TYPE:  PINP   LOCATION:  A338                          FACILITY:  [facility]   PHYSICIAN:  Skeet Latch, DO    DATE OF BIRTH:  [birth year]   DATE OF ADMISSION:  06/28/2008  DATE OF DISCHARGE:  LH                              HISTORY & PHYSICAL   PRIMARY CARE PHYSICIAN:  Kirk Ruths, M.D.   CHIEF COMPLAINT:  Shortness of breath.   HISTORY OF PRESENT ILLNESS:  This is a 77 year old African American  male who presents with a complaint of shortness of breath.  History is  obtained from the chart.  The patient is a poor historian.  Apparently  patient lives with daughter and does not have any home oxygen and is  being followed by Dr. Dietrich Pates for his atrial fibrillation and his  rheumatic heart disease.  Apparently the patient began complaining of  shortness of breath for the past few days and it has been acute nature.  His condition seems to be worsening.  The patient has no associated  chest pain, cough, fever, chills or abdominal discomfort.  The patient  has a history of diabetes, hypertension, valvular heart disease.  No  history of CHF.  The patient states his shortness of breath has been  acute in nature and seems to be improved with oxygen since he was  admitted to the hospital.   PAST MEDICAL HISTORY:  1. Hypertension.  2. Diabetes.  3. Arthritis.  4. Rheumatic heart disease.  5. Chronic atrial fibrillation.   PAST SURGICAL HISTORY:  1. Abdominal surgery.  2. Amputation.   SOCIAL HISTORY:  Denies smoking, alcohol or illicit drug use.  The  patient lives with relatives.   FAMILY HISTORY:  Unknown.   ALLERGIES:  NO KNOWN DRUG ALLERGIES.   HOME MEDICATIONS:  The patient is on multiple medications -- unknown at  this time.  However, he is on Coumadin 5 mg once a day.   REVIEW OF SYSTEMS:  Unable to get adequate review of systems in view of  patient's poor  history.   PHYSICAL EXAMINATION:  GENERAL:  The patient is well developed, well  nourished and well hydrated in no acute distress.  HEENT:  Head is atraumatic and normocephalic.  Eyes are PERRLA.  EOMI.  NECK:  Soft, supple, nontender and nondistended.  CARDIOVASCULAR:  Irregularly irregular rhythm.  No murmurs, rubs or  gallops.  LUNGS:  Decreased but clear.  No rales, rhonchi or wheezing.  ABDOMEN:  Soft, nontender and nondistended.  Positive bowel sounds.  No  rigidity or guarding.  EXTREMITIES:  No clubbing, cyanosis or edema.  NEUROLOGIC:  The patient is slightly confused.  Cranial nerves II-XII  are grossly intact. The patient moves all extremities.  He is alert.  SKIN:  Normal.  No rashes or petechiae are noted.   His EKG showed 67 BPM, atrial fibrillation, left ventricular  hypertrophy.   LABORATORY DATA:  BNP 925, CK MB 2.5, troponin less than 0.05, myoglobin  92, sodium 142, potassium 4.1, chloride 108, CO2 of  29, glucose 92, BUN  17, creatinine 1.27, PT 24.8, INR 2.1, white count 4.0, hemoglobin 13.9,  hematocrit 41.4, platelet count 171,000.   Chest x-ray shows:  1. Probable pneumonia right lung base with associated small right      pleural effusion.  2. Stable cardiomegaly, chronic.  No interstitial lung disease.   ASSESSMENT:  1. Right lower lobe pneumonia.  2. Chronic atrial fibrillation.  3. History of type 2 diabetes.  4. History of rheumatic heart disease.   PLAN:  1. The patient is admitted to service [facility] to the telemetry unit.  2. For his pneumonia the patient will be placed on IV antibiotics,      incentive spirometry, oxygen, and nebulizer treatments around the      clock.  3. The patient probably needs repeat chest x-ray in the next 24 to 48      hours.  4. For chronic atrial fibrillation, the patient will be continued on      his Coumadin per pharmacy protocol with the other PT and INRs.  5. For his diabetes the patient will be placed on  sliding scale with      h.s. coverage.  Blood sugars will be checked before meals and at      bedtime.  6. For his rheumatic heart disease the patient is being followed by      cardiology as an outpatient.  I will not get a consult at this time      unless there is complications regarding his heart disease.  7. The patient is unaware of any medications that he takes at this      time.  I will temporarily place the patient on      medications obtained from chart and these will need to be adjusted      based on the medications when his medications are reconciled in the      morning.  8. The patient is with deep venous thrombosis as well as      gastrointestinal prophylaxis.      Skeet Latch, DO  Electronically Signed     SM/MEDQ  D:  06/28/2008  T:  06/28/2008  Job:  [id number]   cc:   Kirk Ruths, M.D.  Fax: [phone]

## 2010-07-31 NOTE — Group Therapy Note (Signed)
NAME:  [name], [name] NO.:  [id number]   MEDICAL RECORD NO.:  [id number]          PATIENT TYPE:  INP   LOCATION:  A338                          FACILITY:  [facility]   PHYSICIAN:  Skeet Latch, DO    DATE OF BIRTH:  December 27, 1924   DATE OF PROCEDURE:  06/29/2008  DATE OF DISCHARGE:                                 PROGRESS NOTE   SUBJECTIVE:  [name] is an 77 year old African American male who  presented with shortness of breath.  The patient has a history of atrial  fibrillation, rheumatic heart disease.  The patient was found to have a  right lower lobe pneumonia.  Today, the patient states that he feels a  little better.  He seems to be slowly improving.   OBJECTIVE:  VITAL SIGNS:  Temperature is 97.5, pulse 80, respirations  18, blood pressure 135/86.  He is at 100% on room air.  CARDIOVASCULAR:  Irregularly irregular rhythm, no rubs or gallops.  LUNGS:  Decreased, clear, no rales or rhonchi.  ABDOMEN:  Soft, nontender, nondistended, positive bowel sounds.  EXTREMITIES:  No clubbing, cyanosis or edema.   LABORATORIES:  Pending at this time.   ASSESSMENT AND PLAN:  1. Right lower lobe pneumonia:  Will continue with IV antibiotics as      well as oxygen and breathing treatments at this time.  Will      encourage incentive spirometry, encourage ambulation.  2. Chronic atrial fibrillation:  Patient continues to be on Coumadin      which is being dosed by pharmacy.  3. Lastly, the patient has a history of diabetes and rheumatic heart      disease:  Seem to be stable.  Will get a repeat chest x-ray      tomorrow morning.   Pending his x-ray results and ambulation, the patient probably can be  discharged in the next 1-2 days on p.o. antibiotics.  Will continue with  DVT and GI prophylaxis.      Skeet Latch, DO  Electronically Signed     SM/MEDQ  D:  06/29/2008  T:  06/29/2008  Job:  [id number]

## 2010-07-31 NOTE — Discharge Summary (Signed)
NAMEHANK, [name]                ACCOUNT NO.:  [id number]   MEDICAL RECORD NO.:  [id number]          PATIENT TYPE:  INP   LOCATION:  A338                          FACILITY:  [facility]   PHYSICIAN:  Osvaldo Shipper, MD     DATE OF BIRTH:  09-23-1924   DATE OF ADMISSION:  06/28/2008  DATE OF DISCHARGE:  04/16/2010LH                               DISCHARGE SUMMARY   PRIMARY MEDICAL PHYSICIAN:  Kirk Ruths, M.D.   DISCHARGE DIAGNOSES:  1. Right lower lobe pneumonia, community-acquired, improved.  2. Congestive heart failure with possibly mild exacerbation.  3. Atrial fibrillation on Coumadin.  4  Pleural effusion likely secondary to infection.  5  Diabetes diet controlled.  1. Hypertension.   Please review H&P dictated by Dr. Lilian Kapur for details regarding the  patient's presenting illness.   BRIEF HOSPITAL COURSE:  Briefly, this is an 77 year old African American  male who presented to the hospital with complaints of shortness of  breath.  He was evaluated in the ED and found to have right lower lobe  pneumonia on chest x-ray.  Unfortunately, I do not have room air  saturations, but SATs on 2 liters were 100% when he was evaluated in the  ED.  The patient was admitted to the hospital with a diagnosis of  pneumonia and he was started on antibiotics.  He responded quite well.  His shortness of breath improved and actually he is feeling back to his  baseline today.  He denies any chest pain.  He did have a small pleural  effusion on his chest x-ray.  His EF is known to be normal.  He does  have A-fib.  He could have diastolic dysfunction, so I gave him a dose  of IV Lasix yesterday.  His BNP when he was admitted was 925 and came  down to 454.  He is on p.o. Lasix at home which was continued.  He is  ambulating with no difficulties, satting 95% on room air.  So, overall  the patient feels much better.  He is keen on going home and objectively  he looks better too.  For his A-fib, he  was continued on Coumadin and  Cardizem.  His INR is has remained therapeutic.  The rest of his labs  were quite unremarkable.  TSH was 1.61.   On the day of discharge, the patient is feeling well.  He is quite happy  about going home.  He denies any complaints, breathing is much better.  His vital signs are all stable.  Blood pressure is 134/78, heart rate is  68; however, I just reviewed the telemetry and it is showing heart rate  in the 50s, occasionally dropping into the late 40s.  The patient is  asymptomatic.  Blood pressure is 134/78.   His lungs reveal a few crackles at the bases, but no wheezing is  appreciated, much improved aeration.  Cardiovascular system: Irregular,  bradycardic.  Abdomen:  Soft, nontender, nondistended.  Extremities show  minimal edema.   Labs this morning show electrolytes are normal.  His hemoglobin is  stable.  His INR is 2.6, glucose was 85 this morning.   So with respect to his slow A-fib, I have told the nurse to hold his  metoprolol and his Diltiazem this morning.  The patient is asymptomatic  with a slow heart rate.  If he remains stable until after lunch, I think  he should be able to go home and then he can restart all of his  medications tomorrow.  We will also set up home health for an RN to  check on him tomorrow.   DISCHARGE MEDICATIONS:  1. Levaquin 500 mg once daily for 4 days.  2. Albuterol MDI 2 puffs every 4-6 hours as needed for wheezing.  3. Coumadin 5 mg every day.  4. Metoprolol 25 mg b.i.d. starting tomorrow.  5. Famotidine 20 mg once a day.  6. Diltiazem CD 120 mg once a day starting tomorrow.  7. Furosemide 40 mg once daily.   DISCHARGE FOLLOW UP:  Follow up with Dr. Regino Schultze early next week.  Home  health with RN to check on the patient tomorrow.  The patient has been  ambulating, so no need for physical therapy.   DIET:  Heart healthy.   CONSULTATIONS:  No consultations obtained during this admission.   Total time on  this encounter about 35 minutes.      Osvaldo Shipper, MD  Electronically Signed     GK/MEDQ  D:  07/01/2008  T:  07/01/2008  Job:  [id number]   cc:   Kirk Ruths, M.D.  Fax: [phone]

## 2010-08-03 NOTE — H&P (Signed)
   NAME:  [name], [name]                          ACCOUNT NO.:  [id number]   MEDICAL RECORD NO.:  [id number]                   PATIENT TYPE:   LOCATION:                                       FACILITY:   PHYSICIAN:  Dalia Heading, M.D.               DATE OF BIRTH:  [birth year]   DATE OF ADMISSION:  06/22/2002  DATE OF DISCHARGE:                                HISTORY & PHYSICAL   CHIEF COMPLAINT:  Recurrent hematochezia.   HISTORY OF PRESENT ILLNESS:  The patient is a 77 year old, African-American  male who has been chronically anticoagulated in the past who presents with  recurrent episodes of hematochezia.  He was hospitalized one week ago and  did receive a blood transfusion.  His INR at the time was elevated.  He was  subsequently switched to Plavix and aspirin.  He is still having  intermittent hematochezia.  He denies any lightheadedness, abdominal pain or  rectal pain.   PAST MEDICAL HISTORY:  1. Hypertension.  2. Peripheral vascular disease.   PAST SURGICAL HISTORY:  1. SMA artery embolectomy in 2002.  2. Colonoscopy with sclerosis of a bleeding internal hemorrhoid in December     2003.   CURRENT MEDICATIONS:  1. Plavix.  2. Aspirin.  3. Protonix.  4. Stadol.   ALLERGIES:  No known drug allergies.   REVIEW OF SYMPTOMS:  As noted above.   PHYSICAL EXAMINATION:  GENERAL:  The patient is a well-developed, well-  nourished, African-American male in no acute distress.  VITAL SIGNS:  Afebrile and vital signs stable.  LUNGS:  Clear to auscultation with equal breath sounds bilaterally.  HEART:  Regular rate and rhythm without S3, S4 or murmurs.  ABDOMEN:  Soft, nontender, nondistended.  No hepatosplenomegaly or masses  noted.  RECTAL:  Blood in the rectal vault.  Difficult to be certain whether the  bleeding was from hemorrhoidal disease.   IMPRESSION:  Recurrent hematochezia.    PLAN:  The patient is scheduled for a colonoscopy on June 22, 2002.  The  risks and  benefits of the procedure were fully explained to the patient  gaining informed consent.                                               Dalia Heading, M.D.    MAJ/MEDQ  D:  06/17/2002  T:  06/17/2002  Job:  [id number]   cc:   Patrica Duel, M.D.  [address]  [place]  [place] [address]  Fax: [phone]

## 2010-08-03 NOTE — Discharge Summary (Signed)
[place]  Patient:    [name], [name] Visit Number: [id number] MRN: [id number]          Service Type: MED Location: 2000 2030 01 Attending Physician:  Bennye Alm Dictated by:   Loura Pardon, P.A. Admit Date:  11/20/2000                             Discharge Summary  DATE OF BIRTH:  [birth year]  ADDENDUM:  At the end of the dictation where we are talking about the office visit to [facility] for a blood check, which is Tuesday, December 16, 2000, at 9 oclock in the morning, please also mention that the target INR should be greater than 3.0.  The patient was previously on Coumadin and generated clot in the SMA, so anticoagulation levels hence forward should be adjusted higher. Dictated by:   Loura Pardon, P.A. Attending Physician:  Bennye Alm DD:  12/12/00 TD:  12/12/00 Job: [id number]

## 2010-08-03 NOTE — Discharge Summary (Signed)
[facility]  Patient:    [name], [name] Visit Number: [id number] MRN: [id number]          Service Type: MED Location: 2A A201 01 Attending Physician:  Dalia Heading Dictated by:   Franky Macho, M.D. Admit Date:  09/29/2001 Discharge Date: 10/01/2001   CC:         Patrica Duel, M.D.   Discharge Summary  AGE:  77 years old.  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 His hematocrit at the time of admission was 25. Platelet count was within normal limits. INR was appropriate for being on Coumadin. He underwent a colonoscopy as well as EGD on September 29, 2001. His colonoscopy was unremarkable except for hematochezia and swollen internal hemorrhoids. EGD was unremarkable. He underwent a bleeding scan that day which was negative for active bleeding. He did receive 2 units of packed red blood cells. His hematocrit came up to approximately 30. This stayed stable over a 24-hour period. His hematochezia has significantly decreased. His diet was advanced without difficulty.  DISPOSITION:  The patient is being discharged home on October 01, 2001 in fair and stable condition.  DISCHARGE INSTRUCTIONS:  The patient is to take a stool softener twice a day, Anusol-HC suppositories one per rectum b.i.d., he is to resume all of his other medications as previously prescribed.  FOLLOWUP:  The patient is to follow up with Dr. Franky Macho on October 08, 2001.  PRINCIPAL DIAGNOSES: 1. Gastrointestinal bleeding, resolved, question avascular malformation in the    small intestine or colon. 2. Internal hemorrhoidal disease. 3. Chronic anticoagulation. 4. Hypertension.  PRINCIPAL PROCEDURE:  Esophagogastroduodenoscopy and colonoscopy on September 29, 2001. Dictated by:   Franky Macho, M.D. Attending  Physician:  Dalia Heading DD:  10/01/01 TD:  10/04/01 Job: [id number]

## 2010-08-03 NOTE — Discharge Summary (Signed)
[place]. [facility]  Patient:    [name], [name]                       MRN: [id number] Adm. Date:  [id number] Disc. Date: [id number] Attending:  Colvin Caroli Dictator:   Lissa Merlin, P.A. CC:         Dr. Nobie Putnam  CVTS Office   Discharge Summary  DATE OF BIRTH:  1925/02/24  ADMISSION DIAGNOSIS:  Ischemic of right lower extremity with bilateral lower extremity peripheral vascular occlusive disease.  DISCHARGE DIAGNOSIS:  Ischemic of right lower extremity with bilateral lower extremity peripheral vascular occlusive disease.  PAST MEDICAL HISTORY: 1. Known peripheral vascular occlusive disease with recent increasing    claudication in his right lower extremity. 2. Paroxysmal atrial fibrillation on Coumadin. 3. "Bowel problem."  PROCEDURE: 1. Right superficial femoral to peroneal artery bypass graft with nonreversed    saphenous vein composite on June 04, 2000. 2. Postoperative ABIs on June 05, 2000.  BRIEF HISTORY:  [name] is a 77 year old male who was referred to Dr. Hart Rochester with complaint of increasing claudication in his right lower extremity with minimal ambulation.  He was only able to ambulate one block.  Doppler examination showed decreased flow in his lower extremities with 0.43 on the right leg and 0.66 on the left leg.  Dr. Hart Rochester recommended angiography. This was done on June 02, 2000.  This confirmed his diagnosis of peripheral vascular disease.  He recommended bypass graft in his right leg.  The risks, benefits, details, and alternatives were discussed and it was agreed to proceed.  HOSPITAL COURSE:  He was brought into the hospital for the elective procedure on June 04, 2000.  There were no complications.  He was taken to PACU in stable condition.  Postoperative, [name] had a benign course with no major complications.  Postoperative ABIs showed good flow.  His main reason for staying several days was to get  his Coumadin therapeutic again.  By June 10, 2000, postoperative day #6, he was feeling well with no complaints.  He was afebrile and vital signs were stable.  His graft was patent with a palpable graft pulse with warm, well-perfused lower extremities.  His wounds were all healing well.  His INR was 1.8.  He was ambulating with no problem, taking p.o. well, bowel and bladder were functioning well.  He was deemed suitable for discharge home and was subsequently discharged.  DISCHARGE MEDICATIONS: 1. Coumadin 5 mg tablets one daily or as directed. 2. Diltiazem 30 mg one p.o. q.i.d. 3. Lasix 40 mg one p.o. q.d. 4. Sotolol 120 mg one p.o. b.i.d. 5. Percocet 5/325 one to two p.o. q.4-6h. p.r.n. for pain.  ALLERGIES:  No known drug allergies.  CONDITION ON DISCHARGE:  Stable.  DISCHARGE INSTRUCTIONS:  [name] was told to do no driving and no strenuous activity.  He was told to walk daily and use his incentive spirometer daily.  He was told he could shower and to clean his wounds daily with mild soap and water and to watch for increasing redness, swelling, drainage, or fever.  He was told the importance of having his blood drawn at Dr. Lars Pinks office Thursday of this week to check his Coumadin.  CONDITION ON DISCHARGE:  Stable.  FOLLOW-UP: 1. Staples will be taken out on Monday, June 16, 2000, at 9 a.m. 2. He will see Dr. Hart Rochester on Tuesday, June 24, 2000, at 11 a.m.  3. He will have his blood drawn in Dr. Lars Pinks office on Thursday, June 12, 2000. DD:  06/10/00 TD:  06/11/00 Job: [id number]

## 2010-08-03 NOTE — H&P (Signed)
   NAME:  [name], [name]                          ACCOUNT NO.:  [id number]   MEDICAL RECORD NO.:  [id number]                   PATIENT TYPE:  INP   LOCATION:  A213                                 FACILITY:  [facility]   PHYSICIAN:  Dalia Heading, M.D.               DATE OF BIRTH:  1924-09-09   DATE OF ADMISSION:  03/08/2002  DATE OF DISCHARGE:                                HISTORY & PHYSICAL   CHIEF COMPLAINT:  Hematochezia.   HISTORY OF PRESENT ILLNESS:  The patient is a 77 year old black male who  presents back with recurrent episodes of hematochezia.  They started last  week.  He denies any abdominal complaints.  He is status post an SMA artery  embolectomy last year.  He was started on Coumadin for heart valve problems  and for atrial fibrillation.  He denies any recent hemorrhoidal problems.  He denies any abdominal pain or rectal pain.   PAST MEDICAL HISTORY:  As noted above and hypertension.   PAST SURGICAL HISTORY:  As noted above.  Colonoscopy was remarkable in July  of 2003 for swollen internal hemorrhoids.  EGD was unremarkable.  He  underwent a bleeding scan that day which was negative for active bleeding at  that time.   CURRENT MEDICATIONS:  1. Coumadin.  2. Lasix.  3. Protonix.  4. Sotalol.   ALLERGIES:  No known drug allergies.   REVIEW OF SYSTEMS:  As noted above.   PHYSICAL EXAMINATION:  GENERAL APPEARANCE:  The patient is a well-developed,  well-nourished, black male in no acute distress.  VITAL SIGNS:  He is afebrile and vital signs are stable.  LUNGS:  Clear to auscultation with equal breath sounds bilaterally.  HEART:  Regular rate and rhythm without S3, S4, or murmurs.  ABDOMEN:  Soft, nontender, and nondistended.  No hepatosplenomegaly or  masses are noted.  RECTAL:  Blood in the rectal vault.  External hemorrhoidal is noted.   IMPRESSION:  Recurrent hematochezia, chronic need for anticoagulation.    PLAN:  The patient was scheduled for a  colonoscopy on March 09, 2002.  The risks and benefits of the procedure were fully explained to the patient,  who gave informed consent.  In addition, he will be admitted to the hospital  for hematocrit checks.                                               Dalia Heading, M.D.    MAJ/MEDQ  D:  03/08/2002  T:  03/08/2002  Job:  [id number]   cc:   Patrica Duel, M.D.  [address], [address]  [place]  [place] [address]  Fax: [phone]
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
## 2010-08-03 NOTE — Op Note (Signed)
[place]. [facility]  Patient:    [name], [name]. Visit Number: [id number] MRN: [id number]          Service Type: Attending:  Di Kindle. Edilia Bo, M.D. Dictated by:   Di Kindle Edilia Bo, M.D. Proc. Date: 11/20/00   CC:         Jonell Cluck, M.D.   Operative Report  [facility] MR#: [id number]  CHIEF COMPLAINT: Abdominal pain, diarrhea, and nausea.  HISTORY OF PRESENT ILLNESS: This is a a pleasant 77 year old gentleman, who states that he developed the acute onset of abdominal pain at approximately 8 p.m. on November 18, 2000 (two days ago).  This pain had persisted and was described as a vague periumbilical pain.  It was quite severe.  His associated symptoms included diarrhea and nausea.  He does not remember seeing any blood in his stool.  He was admitted at [facility] yesterday as these symptoms had persisted and as part of his work-up he underwent a CT scan of the abdomen and pelvis which suggested an embolus in the superior mesenteric artery.  In addition, there were dilated loops of small bowel with some thickening in the walls consistent with ischemic bowel.  The IMA was not visualized.  There was also a defect in the right lobe of the liver with question of a hepatic artery embolus.  In addition, there was some suspicion of nonocclusive thrombus in the superior mesenteric vein.  No free air was noted.  The patient was felt to most likely have had an SMA embolus with acute intestinal ischemia and was transferred here for further evaluation and treatment.  Of note, prior to this episode the patient denies any history of postprandial abdominal pain.  PAST MEDICAL HISTORY:  1. History of atrial fibrillation, although EKG at [facility]     yesterday showed a normal sinus rhythm.  He has been on Coumadin, he says,     for about two years because of his atrial fibrillation.  2. In addition, he reportedly has a  history of congestive heart failure.  3. He also has a history of hypertension.  He denies any history of diabetes, hypercholesterolemia, or history of previous myocardial infarction.  SOCIAL HISTORY: The patient states he is married; however, he does not currently live with his wife.  He denies any history of tobacco use except as a child.  FAMILY HISTORY: His sister had a heart attack when she was in her 75s.  He is unaware of any other history of premature cardiovascular disease.  MEDICATIONS:  1. Lasix 40 mg p.o. q.d.  2. Cardizem 30 mg p.o. q.i.d.  3. Sotalol 120 mg p.o. b.i.d.  4. Coumadin 5 mg alternating with 7.5 mg q.d.  REVIEW OF SYSTEMS: He denies any history of chest pain.  He does admit to dyspnea on exertion.  He denies fever, chills, melena, hematemesis, hematochezia, or any urinary symptoms.  Review Of Systems is otherwise negative.  PHYSICAL EXAMINATION:  VITAL SIGNS: Temperature 97 degrees, blood pressure 94/65, heart rate 111, respiratory rate 28.  Saturation 94%.  HEENT/NECK: I do not detect any carotid bruits.  LUNGS: Clear bilaterally to auscultation.  CARDIAC: Regular rate and rhythm.  ABDOMEN: Soft, with minimal tenderness.  No bowel sounds.  EXTREMITIES: Palpable femoral pulses.  I cannot palpate any distal pulses.  He has monophasic peroneal and dorsalis pedis signal on the right and dorsalis pedis signal on the left, which is monophasic.  He appears somewhat  shocky. There is no significant lower extremity swelling.  LABORATORY DATA: Laboratories at [facility] yesterday showed a WBC of 7.6, H&H 16/48; platelets 200,000.  PTT 32, INR 1.9 yesterday.  Sodium 138, potassium 4.0, chloride 106, CO2 28, BUN 16, creatinine 1.2, glucose 118. Albumin 3.6.  Chest x-ray showed no acute pulmonary process.  EKG showed normal sinus rhythm with biatrial enlargement.  IMPRESSION: This patient presents with the acute onset of abdominal pain which is  out of proportion to this examination.  His CT scan suggests embolus at the superior mesenteric artery and this is the most likely etiology, especially given his history of congestive heart failure and atrial fibrillation.  This has been going on for two days now and he is at significant risk for having infarcted bowel and could very likely require bowel resection and possibly even an ostomy.  I discussed this with the patient.  PLAN: My plan is to take him urgently to the operating room for exploratory laparotomy and SMA embolectomy.  As his INR is 1.9 we will give him fresh frozen plasma.  He will have a central line placed for TNA postoperatively and we will reassess his bowel after we can hopefully reperfuse this.  I have asked the general surgeons to help assess his bowel intraoperatively and will make further recommendations pending the results of his laparotomy.  I did review the potential complications of surgery including, but not limited to, bleeding, sepsis, and death.  All questions were answered and he is agreeable to proceed urgently.Dictated by:   Di Kindle Edilia Bo, M.D. Attending:  Di Kindle. Edilia Bo, M.D. DD:  11/20/00 TD:  11/21/00 Job: [id number]

## 2010-08-03 NOTE — Procedures (Signed)
NAME:  [name], [name] NO.:  [id number]   MEDICAL RECORD NO.:  [id number]          PATIENT TYPE:  OUT   LOCATION:  RAD                           FACILITY:  [facility]   PHYSICIAN:  Vida Roller, M.D.   DATE OF BIRTH:  February 28, 1925   DATE OF PROCEDURE:  08/29/2004  DATE OF DISCHARGE:                                  ECHOCARDIOGRAM   PRIMARY CARE PHYSICIAN:  Patrica Duel, M.D.   Tape number [id number], tape count [id number] through [id number].   This is a followup echocardiogram on a 77 year old man with mitral valve  disease. Last echocardiogram was done in August of 2003 which showed severe  left atrial enlargement and mild mitral stenosis with a mean gradient of 3  mmHg, mild mitral regurgitation, normal left ventricular systolic function,  mild pulmonary hypertension. Today's study is technically adequate.   M-MODE TRACING:  The aorta is 36 mm.   The left atrium is greater than 60 mm.   The septum is 14 mm.   Posterior wall is 14 mm.   Left ventricular diastolic dimension is 43 mm.   Left ventricular systolic dimension is 31 mm.   TWO-DIMENSIONAL AND DOPPLER IMAGING:  The left ventricle is normal size with  systolic function. Estimated ejection fraction 55 to 60%. There was mild  concentric left ventricular hypertrophy, but there were no obvious wall  motion abnormalities.   Right ventricle is normal size with normal systolic function.   Both atrial are significantly dilated with spontaneous echocardiogram  contrast seen in the left atrium. No obvious clot is seen.   The aortic valve is thickened with mild aortic insufficiency. No stenosis is  seen.   The mitral valve is rheumatic with significant tethering of the leaflets.  There is mild regurgitation, and there is moderate to severe mitral stenosis  with a mean gradient of 6 mmHg and a valve area estimated at 1.2 cm2 by the  pressure half-time equation.   There is thickened tricuspid valve with severe  regurgitation which runs in a  circumferential manner around the right atrium.   The pulmonic valve is not well seen.   There is no pericardial effusion.   Ascending aorta appears to be normal size although it is at the limits of  the study.   Inferior vena cava did not appear to be significantly dilated.       JH/MEDQ  D:  08/30/2004  T:  08/30/2004  Job:  [id number]   cc:   Patrica Duel, M.D.  [address], [address]  [place]  [place] [address]  Fax: [phone]

## 2010-08-03 NOTE — Discharge Summary (Signed)
[place]. [facility]  Patient:    Billy, Chapman Visit Number: [id number] MRN: [id number]          Service Type: MED Location: 2000 2030 01 Attending Physician:  Bennye Alm Dictated by:   Loura Pardon, P.A. Admit Date:  11/20/2000 Disc. Date: 12/13/00   CC:         Patrica Duel, M.D.  Gerrit Friends. Dietrich Pates, M.D. [facility]   Discharge Summary  DATE OF BIRTH:  07/08/1924  DISCHARGE DIAGNOSES: 1. 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 2. Dysrhythmias postoperatively with refractive paroxysmal atrial    fibrillation/flutter with episodes of ventricular tachycardia, particularly    a 33 beat run of ventricular tachycardia postoperative day #8 resolving to    a sinus rhythm after DC cardioversion.  Currently on oral sotalol 120 mg    b.i.d. 3. Prolonged postoperative ileus on total parenteral nutrition support from    the day of surgery until postoperative day #22. 4. Acute blood loss anemia requiring transfusion of 2 units of packed red    blood cells on postoperative day #3. 5. Postoperative antibiotics for 19 days. 6. Continuous postoperative anticoagulation first with heparin, then with    combination until Coumadin therapeutic. 7. Deconditioned after surgery with gradual improvement in ambulation    facility.  SECONDARY DIAGNOSES: 1. Rheumatic heart disease, mitral stenosis. 2. History of paroxysmal atrial fibrillation. 3. History of ruptured lumbar disk. 4. Status post amputation of left hand digits 2, 3, and 4. 5. Status post herniorrhaphy. 6. History of congestive heart failure. 7. Hypertension. 8. 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  PROCEDURE: 1. November 20, 2000:  Emergent exploratory laparotomy, thrombectomy of    superior mesenteric artery, Dr. Waverly Ferrari surgeon.  Patient  tolerated the procedure well and was transferred in stable, satisfactory    condition to the intensive care unit. 2. November 25, 2000:  Transesophageal echocardiogram.  The study showed that    left ventricular systolic function was normal.  Ejection fraction estimated    at 55-65%.  Aortic valve mildly sclerotic.  Mitral valve demonstrates    rheumatic deformity of the valve with no submitral ______ involvement.  No    significant mitral valvular regurgitation.  Some mitral stenosis.  There is    no finding of mural thrombus in the study.  No pericardial effusion. 3. November 26, 2000:  Ibutilide cardioversion unsuccessful at converting to    sinus rhythm. 4. November 27, 2000:  DC cardioversion converting to a sinus rhythm.  He did    have one episode of 33 beat ventricular tachycardia September 13.  EPS    consult obtained recommending RFCA radiofrequency catheter ablation four    weeks after anticoagulation started.  Estimated procedure time would be    about October 5. 5. December 01, 2000:  KUB consistent with continued ileus.  DISCHARGE DISPOSITION:  [name] is ready for discharge after 22 days postoperatively after thrombectomy of the superior mesenteric artery on September 5.  He has been taking food for the last seven days, at first only full liquids and then for the last four days tolerating a regular diet.  He has full GI tract function and has regular bowel movements.  He had one small episode of bright red blood per rectum one day ago but this was an isolated incident and his hemoglobin  had remained stable.  He is ambulating well.  His mental status has remained clear in the postoperative period at all times.  He has remained afebrile in the postoperative period as well.  In the additional period following surgery he had refractive paroxysmal atrial fibrillation/flutter.  He was maintained by cardiology who consulted from postoperative day #1 on IV digoxin,  Cardizem, Lopressor, and even for a one day period amiodarone.  Two attempts at cardioversion were made, one on postoperative day #6 and one on postoperative day #7 with DC cardioversion appearing to convert to a sinus rhythm.  He has had two to three episodes of wide complex tachycardia judged to be a ventricular tachycardia at all times with cardiac dysrhythmias.  He has remained hemodynamically stable.  In addition, he has not experienced any respiratory compromise postoperatively. He has been maintained on nebulizer therapy.  As mentioned above, he has been maintained on IV anticoagulation until Coumadin which he had been on preoperatively could be made therapeutic.  This has happened at the low end of the range of the INR by postoperative day #23.  He had been maintained as mentioned above also on IV antibiotics for 19 days.  An NG tube was placed intraoperatively.  This was removed on postoperative day #4 but due to continuing prolonged ileus which was confirmed by a KUB on postoperative day #11, the NG tube was replaced for a three day period.  When it was finally removed on postoperative day #13 he was started on clear liquids, Coumadin, and his preoperative dose of sotalol 120 mg b.i.d.  His diet was then adjusted to full liquids by postoperative day #15 and then to a regular diet by postoperative day #18 which he has been tolerating.  He has been encouraged to take many small meals and he has received Ensure supplementation with his regular diet.  Also as mentioned above, he has been seen by Dr. Graciela Husbands, EPS specialist for wide complex nonsustained ventricular tachycardias.  He was seen on postoperative day #8 and then on postoperative day #11.  It was thought that he could benefit from radiofrequency catheter ablation after four weeks of anticoagulation.  This will be scheduled through Dr. Langston Masker office in [place].  DISCHARGE MEDICATIONS: 1. Percocet 5/325 one to two  tablets p.o. q.4-6h. p.r.n. pain.  2. Combivent metered dose inhaler two puffs q.6h. 3. Lasix 40 mg q.d. 4. Pepcid 20 mg b.i.d. 5. Enteric coated aspirin 81 mg q.d. 6. Coumadin dose to be determined at discharge. 7. Magnesium oxide 400 mg q.d. 8. Sotalol 120 mg b.i.d.  DISCHARGE ACTIVITY:  Ambulate to build his strength.  He is asked not to drive until he sees Dr. Edilia Bo in the office.  DISCHARGE DIET:  Low sodium, low cholesterol.  He is urged to keep drinking Ensure to help regain his strength.  WOUND CARE:  He may shower daily upon discharge.  FOLLOW-UP:  Dr. Edilia Bo Wednesday, December 31, 2000 at 8:40 a.m.  He will visit the [facility] office of [facility] Tuesday, October 1 at 9 a.m. for a blood test to check anticoagulation levels, PT/INR.  He also has an office visit with Dr. Dietrich Pates, [facility], Tuesday, December 30, 2000 at 1:15 p.m.  BRIEF HISTORY:  [name] is a 77 year old African-American male.  He has a history of mitral stenosis, atrial fibrillation, and infrainguinal arterial occlusive disease.  He was transferred to [facility] on September 5 by Dr. Patrica Duel with a tentative diagnosis of  acute ischemic bowel.  Mr. Rao presented to [facility] on September 4 complaining of a five day history of nausea, vomiting, diarrhea, and diffuse abdominal pain.  Computed tomogram of the abdomen demonstrated occlusion of the superior mesenteric artery with thrombus.  Patient was transported here to [facility].  Was seen by Dr. Waverly Ferrari who recommended emergent exploratory laparotomy and embolectomy of the superior mesenteric artery.  HOSPITAL COURSE:  As described in discharge disposition. Dictated by:   Loura Pardon, P.A. Attending Physician:  Bennye Alm DD:  12/12/00 TD:  12/12/00 Job: [id number]

## 2010-08-03 NOTE — Discharge Summary (Signed)
   NAME:  [name], [name]                          ACCOUNT NO.:  [id number]   MEDICAL RECORD NO.:  [id number]                   PATIENT TYPE:  INP   LOCATION:  A216                                 FACILITY:  [facility]   PHYSICIAN:  Kirk Ruths, M.D.            DATE OF BIRTH:  1924-07-18   DATE OF ADMISSION:  06/11/2002  DATE OF DISCHARGE:  06/14/2002                                 DISCHARGE SUMMARY   DISCHARGE DIAGNOSES:  1. Recurrent gastrointestinal bleed.  2. Prolonged INR on Coumadin.  3. History of atrial fibrillation.  4. History of prostate cancer.  5. History of rheumatic heart disease.   HOSPITAL COURSE:  This is 77 year old black male who had a similar problem  approximately three months ago with a rectal bleeding secondary to Coumadin  therapy.  Total colonoscopy at that time by Dr. Lovell Sheehan showed no acute  abnormalities, some internal hemorrhoids.  The patient originally was seen  for this problem three days before admission.  INR was at 3.1 so his  Coumadin was held.  He was followed the following day and again the INR was  approximately the same at that time, and hematocrit dropped from 27 to 25.  The patient continued to have bleeding.  The day of admission his hematocrit  was spun at 18 although a CBC on admission showed it was actually 22.  He  was admitted for transfusion and close observation.  He was seen in  consultation by Dr. Lovell Sheehan who basically agreed with observation,  transfusion, and holding Coumadin.  After the transfusion the patient's  hemoglobin jumped from 7.2 to 10.7, down to 9.7 the next day.  Today it was  9.8.  He has had significant decrease in his bleeding.  INR is back to  normal, being 1.1 today.  The patient is without complaints, tolerating a  regular diet, stable.   DISPOSITION:  Will discharge him on his previous medications but will  discontinue his Coumadin and in the place of put him on Plavix and aspirin.  To be followed by Dr.  Lovell Sheehan in three days in the office.                                               Kirk Ruths, M.D.    WMM/MEDQ  D:  06/14/2002  T:  06/14/2002  Job:  [id number]

## 2010-08-03 NOTE — Discharge Summary (Signed)
   NAME:  [name], [name]                          ACCOUNT NO.:  [id number]   MEDICAL RECORD NO.:  [id number]                   PATIENT TYPE:  INP   LOCATION:  A212                                 FACILITY:  [facility]   PHYSICIAN:  Dalia Heading, M.D.               DATE OF BIRTH:  April 07, 1924   DATE OF ADMISSION:  06/21/2002  DATE OF DISCHARGE:  06/23/2002                                 DISCHARGE SUMMARY   HOSPITAL COURSE:  The patient is a 77 year old black male who presents with  recurrent hematochezia.  His hemoglobin at the time of admission was 6.8.  He did receive 3 units of packed red blood cells.  A bleeding scan was  performed which revealed active rectal bleeding.  He underwent a flexible  sigmoidoscopy and was found to have proctitis.  He was put on ProctoFoam.  Followup hematocrits have been for the most part stable.  Unfortunately, he  has to stay on Plavix and aspirin due to history of mesenteric thrombosis.   The patient is being discharged home on June 23, 2002 in fair and stable  condition.   DISCHARGE INSTRUCTIONS:  The patient is to follow up with Dr. Franky Macho  on June 29, 2002.   DISCHARGE MEDICATIONS:  1. ProctoFoam HC 1 per rectum twice a day.  2. Stool softener.  3. He is to resume his Plavix and aspirin as previously prescribed.   PRINCIPAL DIAGNOSES:  1. Proctitis.  2. Anemia.  3. Recurrent hematochezia.  4. Hypertension.  5. Peripheral vascular disease.   PRINCIPAL PROCEDURE:  Flexible sigmoidoscopy on June 22, 2002.                                               Dalia Heading, M.D.    MAJ/MEDQ  D:  06/23/2002  T:  06/23/2002  Job:  [id number]   cc:   Patrica Duel, M.D.  [address], [address]  [place]  [place] [address]  Fax: [phone]

## 2010-08-03 NOTE — Letter (Signed)
January 02, 2006     Patrica Duel, M.D.  [address]  [place], [place] [address]   RE:  [name], [name]  MRN:  [id number]  /  DOB:  05-26-1924   Dear Loraine Leriche:   [name] returns to the office for continued assessment and treatment of  rheumatic heart disease and chronic atrial fibrillation requiring  anticoagulation.  Since I last saw him 8 months ago, he has done generally  well.  He describes an incident in church when he felt extremely hot.  He  was assisted to walk outside and recovered without loss of consciousness.  Otherwise, he reports no dyspnea.  He feels that he is generally doing well,  especially in light of his age of 79 years.   MEDICATIONS:  Unchanged from his last visit.   PHYSICAL EXAMINATION:  The weight is 127 pounds, 6 pounds less in February  2007, blood pressure 135/85, heart rate 58 and irregular.  Slight older gentleman in no acute distress.  HEENT:  Poor dentition.  NECK:  No jugular venous distention; normal carotid upstrokes without  bruits.  LUNGS:  Few early inspiratory rales on the left.  CARDIAC:  No abnormal precordial pulsations; normal first heart sounds;  increased pulmonic component of second heart sound with prominent splitting;  opening snap present, but I still cannot hear a diastolic murmur.  ABDOMEN:  Soft and nontender; no organomegaly.  EXTREMITIES:  Trace edema.   IMPRESSION:  [name] is stable with respect to rheumatic heart disease  and atrial fibrillation.  I advised him to lie down immediately if he  experiences symptoms similar to those he had in church.  We will obtain a  CBC, metabolic profile, stool for Hemoccult testing.  A flu vaccine was  administered at the patient's request.  Aspirin will be discontinued, since  this is not providing any significant benefit.  I will plan to see this nice  gentleman again in 6 months.    Sincerely,      Gerrit Friends. Dietrich Pates, MD, [facility]    RMR/MedQ  DD:  01/02/2006  DT: 01/04/2006  Job #: [id number]

## 2010-08-03 NOTE — Discharge Summary (Signed)
   NAME:  [name], [name]                          ACCOUNT NO.:  [id number]   MEDICAL RECORD NO.:  [id number]                   PATIENT TYPE:  INP   LOCATION:  A213                                 FACILITY:  [facility]   PHYSICIAN:  Dalia Heading, M.D.               DATE OF BIRTH:  [birth year]   DATE OF ADMISSION:  03/08/2002  DATE OF DISCHARGE:  03/10/2002                                 DISCHARGE SUMMARY   HOSPITAL COURSE:  The patient is a 77 year old black male who is chronically  on Coumadin for atherosclerotic disease who presented with lower  gastrointestinal bleeding.  He was admitted through the emergency room for  monitoring of his lower gastrointestinal bleeding.  The patient had a  previous colonoscopy in July of 2003 which showed only internal hemorrhoidal  disease.  He underwent  a colonoscopy on March 09, 2002 and was found to  have a bleeding internal hemorrhoid.  This was sclerosed.  His hematocrit  varied anywhere between 28 and 32 throughout his stay. He did not have a  coagulopathy.  His diet was advanced without difficulty.  His blood pressure  and hematocrit have stayed stable and the patient is being discharged home  on March 10, 2002 in good and improving condition.   DISCHARGE INSTRUCTIONS:  The patient is to follow up with Dr. Franky Macho  on March 23, 2002.   DISCHARGE MEDICATIONS:  1. Anusol suppositories, one per rectum b.i.d. for ten days.  2. Patient is to resume all his other medications as previously prescribed.   PRINCIPAL DIAGNOSIS:  1. Hemorrhoidal bleeding.  2. Chronic anticoagulation.  3. Atherosclerotic disease.  4. Hypertension.   PRINCIPAL PROCEDURE:  Colonoscopy with sclerosis with control of internal  hemorrhoidal bleeding on March 09, 2002.                                               Dalia Heading, M.D.    MAJ/MEDQ  D:  03/10/2002  T:  03/11/2002  Job:  [id number]   cc:   Patrica Duel, M.D.  [address]  [place]  [place] [address]  Fax: [phone]

## 2010-08-03 NOTE — Op Note (Signed)
[place]. [facility]  Patient:    [name], [name]                         MRN: [id number] Proc. Date: 06/04/00 Attending:  Quita Skye. Hart Rochester, M.D.                           Operative Report  PREOPERATIVE DIAGNOSIS:  Ischemic right lower extremity secondary to superficial femoral, popliteal and tibial occlusive disease.  POSTOPERATIVE DIAGNOSIS:  Ischemic right lower extremity secondary to superficial femoral, popliteal and tibial occlusive disease.  OPERATIVE PROCEDURE:  Right superficial femoral to peroneal bypass using a composite nonreverse saphenous vein graft from right leg with intraoperative arteriogram.  SURGEON:  Quita Skye. Hart Rochester, M.D.  ASSISTANT:  Dominica Severin, P.A.  ANESTHESIA:  General endotracheal.  DESCRIPTION OF PROCEDURE:  The patient was taken to the operating room and placed in the supine position at which time satisfactory general endotracheal anesthesia was administered. The right leg was prepped with Betadine scrub and solution and draped in a routine sterile manner.  The saphenous vein was exposed from the saphenofemoral junction to just above the ankle through multiple lower extremity incisions along the medial aspect of the right leg. It was removed after ligating its branches with 4-0 and 5-0 silk ties and dividing them. It was gently dilated with heparinized saline. It was borderline in size but was adequate proximally and distally but there was s segment in the center being only about 2 mm in size. This was resected and after spatulating both ends a primary end-to-end reanastomosis was done with two 7-0 Prolene sutures. This gave adequate length to get from the proximal superficial femoral to the peroneal artery. The superficial femoral artery was exposed through the vein harvesting incision about 10-12 cm distal to its origin where it was a soft vessel and still had an excellent pulse. The peroneal artery was then exposed in the  mid-calf through the distal incision. There were multiple areas of atherosclerosis noted more proximally but it seemed to be relatively free of disease in the distal aspect and on the angiogram. The patient was then heparinized. The superficial femoral artery was occluded proximally and distally and opened with a #15 blade and extended with the Potts scissors. The proximal end of the vein was spatulated and anastomosed end-to-side using continuous 6-0 Prolene. Clamps were then released and there was an excellent pulse in the first set of competent valves. Using the retrograde valvulotome, the valves were rendered in competent with resultant excellent flow out of the distal end of the vein graft. The vein was then carefully delivered through the subcutaneous tunnel and the peroneal artery was occluded proximally and distally with vessel loops, opened with a #15 blade, extended with the Potts scissors. It would accept a 2.5 mm dilator distally and had fair back-bleeding. The vein was carefully measured and spatulated and anastomosed end-to-side using continuous 6-0 Prolene for the heel, interrupted 7-0 Prolene for the toe. Following appropriate flushing is completed, the vessel loops were released and there was an excellent pulse in the vein graft and the peroneal artery. Intraoperative arteriogram revealed a widely patent anastomosis with one vessel runoff through the peroneal artery which filled the posterior tibial artery at the ankle by collaterals. Protamine was given to reverse the heparin. Following adequate hemostasis, the wounds were irrigated with saline and closed in layers with Vicryl and clips. Sterile dressing  was applied. The patient was taken to the recovery room in satisfactory condition. DD:  06/04/00 TD:  06/04/00 Job: [id number]

## 2010-08-03 NOTE — Op Note (Signed)
[place]  Patient:    BOOKERT, GUZZI Visit Number: [id number] MRN: [id number]          Service Type: Attending:  Di Kindle. Edilia Bo, M.D. Dictated by:   Di Kindle Edilia Bo, M.D. Proc. Date: 11/20/00   CC:         Patrica Duel, MD   Operative Report  PREOPERATIVE DIAGNOSIS:  Acute mesenteric ischemia secondary to superior mesenteric artery embolus.  POSTOPERATIVE DIAGNOSIS:  Acute mesenteric ischemia secondary to superior mesenteric artery embolus.  OPERATION PERFORMED: 1. Exploratory laparotomy. 2. Embolectomy of superior mesenteric artery.  SURGEON:  Di Kindle. Edilia Bo, M.D.  ASSISTANT:  Sherrie George, P.A.  ANESTHESIA:  General.  INDICATIONS FOR PROCEDURE:  The patient is a 77 year old gentleman who 48 hours ago developed the sudden onset of abdominal pain, diarrhea and nausea. He was admitted yesterday in [place] and his CAT scan showed evidence of embolus to the superior mesenteric artery.  He was transferred here for further evaluation.  Of note, the patient does have a history of atrial fibrillation.  He was on Coumadin with an INR of 1.9.  DESCRIPTION OF PROCEDURE:  The patient was taken to the operating room and received two units of fresh frozen plasma.  A Swan-Ganz and arterial line were placed by Anesthesia.  The abdomen and groins were prepped and draped in the usual sterile fashion.  The abdomen was entered through a midline incision. Upon careful exploration, the small intestine did look viable.  The transverse colon was reflected superiorly and the small bowel reflected inferiorly and the mesentery divided.  The superior mesenteric artery was identified and controlled with vessel loops.  One lateral branch was also controlled with a red vessel loop and enough of the artery was exposed to allow embolectomy. Transverse arteriotomy was made after after the patient was heparinized and a #3 Fogarty  catheter was passed distally and a large amount of organized clot was retrieved and this was sent to pathology.  Proximal embolectomy was performed and there was no clot proximally.  Excellent inflow established. The vessels were flushed with heparinized saline and then reclamped.  The arteriotomy which was transverse was closed with interrupted 6-0 Prolene sutures.  At completion there was an excellent Doppler signal in the superior mesenteric artery.  The patient developed some hematoma within the mesentery and therefore I opened the mesentery and the site of bleeding was oversewn with a 3-0 Vicryl and this controlled the bleeding.  We then reinspected the bowel.  The segment of bowel adjacent to where I had oversewn the artery, looked okay, but not quite as well perfused as the adjacent segments.  We therefore injected a gram of fluorescein to look with the Woods lamp and this area now did appear to be adequately perfused.  With time, this segment of bowel continued to show evidence of improvement and looked like it was clearly viable.  General surgeon Dr. Carolynne Edouard was consulted and helped [place] assess the bowel.  Next the abdomen was irrigated with copious amounts of antibiotic solution.  The abdominal contents were returned to their normal position. Again careful exploratory laparotomy showed no other significant pathology with the exception of some adhesions around the lateral right dome of the liver.  The gallbladder appeared normal.  The appendix appeared normal.  The colon appeared normal.  The fascial layer was closed with running #1 PDS sutures.  The skin was closed with staples.  Next, the left infraclavicular area was prepped and draped in  the usual sterile fashion and a left subclavian vein triple lumen catheter was placed.  The vein was cannulated and the guide wire introduced into the superior vena cava.  The tract was dilated and then a triple lumen catheter placed over the wire  and the wire removed.  The catheter was secured to its exit site with two 3-0 nylon sutures.  All three ports withdrew easily, were then flushed with heparinized saline and filled with concentrated heparin.  The catheter was secured at its exit site and sterile dressing was applied.  The patient tolerated the procedure well and was transferred to recovery room in satisfactory condition.  The sponge and needle counts were correct.  The instrument count was questioned and therefore a flat plate of the abdomen was obtained which showed no remaining instruments in the abdomen. dressing was applied.  The patient tolerated the procedure well and was transferred to the recovery room in satisfactory condition.  All needle and sponge counts were correct. Dictated by:   Di Kindle Edilia Bo, M.D. Attending:  Di Kindle. Edilia Bo, M.D. DD:  11/20/00 TD:  11/21/00 Job: [id number]

## 2010-08-03 NOTE — Procedures (Signed)
[place]  Patient:    [name], [name]                       MRN: [id number] Proc. Date: 06/02/00 Adm. Date:  [id number] Attending:  Colvin Caroli                           Procedure Report  PREOPERATIVE DIAGNOSIS:  Rest ischemic right lower extremity, secondary to superficial femoral, popliteal, and tibial occlusive disease.  PROCEDURE:  Abdominal aortogram with bilateral lower extremity runoff via right common femoral approach.  SURGEON:  Quita Skye. Hart Rochester, M.D.  ANESTHESIA:  Local Xylocaine and Versed 2 mg intravenously.  COMPLICATIONS:  None.  DESCRIPTION OF PROCEDURE:  The patient was taken to the peripheral endovascular laboratory and placed in the supine position, at which time both groins were prepped with Betadine solution and draped in a routine sterile manner.  After infiltration with 1% Xylocaine, the right common femoral artery was entered percutaneously.  A guide wire was passed into the suprarenal aorta under fluoroscopic guidance.  A 5-French sheath and dilator were passed over the guide wire.  The dilator was removed.  A standard pigtail catheter was positioned in the suprarenal aorta.  A flush abdominal aortogram was performed injecting 20 cc of contrast at 20 cc per second, and this revealed both renal arteries to be widely patent, with no stenosis, and a very tortuous but nonstenotic infrarenal aorta.  The inferior mesenteric artery did not fill. Both internal and external and common iliac arteries were widely patent, with a lot of tortuosity, but no stenoses.  The catheter was withdrawn into the terminal aorta and bilateral lower extremity angiography was performed, injecting 88 cc of contrast at 8 cc per second, and this revealed the left iliac and common femoral system to be widely patent.  The left superficial femoral artery was patent to the knee, but did have some irregularity distally, but no stenoses.  There  was total occlusion of the left popliteal artery at the knee joint, with reconstitution of the peroneal artery visualized in the mid-middle third of the leg, filling to above the ankle with late filling of a small posterior tibial artery distally in the left leg.  The right leg revealed patency of the right external iliac and common femoral artery with no stenoses.  The proximal right superficial femoral artery was patent with disease beginning in the midthigh, and occlusion above the knee of the right superficial femoral artery.  There was very faint filling of a 1.0 cm segment of the popliteal artery, and then total occlusion at the knee joint, with no distal tibial vessels visualized on this view.  Therefore the pigtail catheter was removed over the guide wire, and a right lower extremity angiogram performed through the sheath, injecting 25 cc of contrast with a peek-hole view of the tibial vessels below the knee.  This revealed the peroneal artery to be patent, beginning about 8.0 cm distal to the popliteal artery, and filling to the ankle, with late filling of a small twig of posterior tibial artery.  This was confirmed with the distal view with another 25 cc injection.  Having tolerated the procedure well, the sheath was removed. Adequate compression was applied.  No complications ensued.  FINDINGS: 1. Widely patent aortoiliac system with tortuous aorta. 2. Diffuse left superficial femoral disease distally, with a widely    patent vessel and  total occlusion of the popliteal artery at the    knee joint, with one-vessel runoff through the peroneal artery    from the midcalf distally, and late filling of left posterior    tibial artery at the ankle. 3. Patent right superficial femoral artery to the midthigh, with    occlusion in the distal third of the thigh and one-vessel runoff    through the peroneal artery, reconstituting in the upper to mid-third    of the calf.DD:  06/02/00 TD:   06/02/00 Job: [id number]

## 2010-08-03 NOTE — H&P (Signed)
NAME:  [name], [name]                          ACCOUNT NO.:  [id number]   MEDICAL RECORD NO.:  [id number]                   PATIENT TYPE:  INP   LOCATION:                                       FACILITY:  [facility]   PHYSICIAN:  Kirk Ruths, M.D.            DATE OF BIRTH:  1924/04/06   DATE OF ADMISSION:  06/11/2002  DATE OF DISCHARGE:                                HISTORY & PHYSICAL   CHIEF COMPLAINT:  Bleeding per rectum.   PRESENTING ILLNESS:  This is a 77 year old black male who has a previous  history of a similar GI bleed several months ago.  The patient at that time  had had bleeding per rectum with a prolonged prothrombin time.  He had  undergone EGD by Dr. Franky Macho at the time that found bleeding internal  hemorrhoids; otherwise, normal colonoscopy.  On this occasion he presented  three days before this admission with a feeling of bleeding per rectum.  Exam was benign.  There was obviously some blood in his rectum.  INR at that  time was 3.1 and hematocrit was 27.5.  His Coumadin was held.  His labs were  repeated the following day that showed hematocrit 25.5, prothrombin time up  to 3.3.  The patient was continued off his Coumadin and told if bleeding got  worse or if he got symptomatic to go to the hospital; otherwise, will follow  up on today.  In the office today he denies extreme fatigue, shortness of  breath, or chest pain.  States he mowed grass yesterday.  A spun hematocrit  here in the office shows 18 although I feel based on his exam it is probably  a little higher than that, but due to the refractory nature of his problem  and prolongation of it, will get transfused and continue to hold his  Coumadin, let Dr. Lovell Sheehan see the patient again and see if we can stop his  bleeding.   PAST MEDICAL HISTORY:  1. History of prostate cancer.  2. Atrial fibrillation.  3. Rheumatic heart disease.   MEDICATIONS:  1. Coumadin 5 mg daily up to several days ago.  2.  Betapace 120 b.i.d.  3. Lasix 40 daily.  4. Xanax p.r.n.  5. Cardizem 30 mg q.i.d.   REVIEW OF SYSTEMS:  He denies nausea, vomiting, chest pain, shortness of  breath.  Admits to some mild fatigue.   PHYSICAL EXAMINATION:  HEENT:  TMs normal.  Pupils equal to light and  accomodation.  His conjunctivae are slightly pale.  Oropharynx is benign.  NECK:  Supple without JVD, bruit, or thyromegaly.  LUNGS:  Clear in all areas.  HEART:  Regular sinus rhythm without murmur, gallop, or rub.  ABDOMEN:  Soft, nontender.  EXTREMITIES:  Without clubbing, cyanosis, or edema.  RECTAL:  There is bright blood in his rectal area, a minimal amount.  VITAL SIGNS:  Pulse 88 and regular, blood pressure 180/64, respirations 20  and unlabored.   ASSESSMENT:  1. Recurrent gastrointestinal bleed.  2. Prolonged prothrombin time, on Coumadin.  3. History of atrial fibrillation.  4. History of prostate cancer.  5. History of rheumatic heart disease.                                               Kirk Ruths, M.D.    WMM/MEDQ  D:  06/11/2002  T:  06/11/2002  Job:  [id number]

## 2010-08-20 ENCOUNTER — Other Ambulatory Visit: Payer: Self-pay | Admitting: Cardiology

## 2010-08-27 ENCOUNTER — Ambulatory Visit (INDEPENDENT_AMBULATORY_CARE_PROVIDER_SITE_OTHER): Payer: Medicare Other | Admitting: *Deleted

## 2010-08-27 DIAGNOSIS — I749 Embolism and thrombosis of unspecified artery: Secondary | ICD-10-CM

## 2010-08-27 DIAGNOSIS — Z7901 Long term (current) use of anticoagulants: Secondary | ICD-10-CM

## 2010-08-27 DIAGNOSIS — I4891 Unspecified atrial fibrillation: Secondary | ICD-10-CM

## 2010-08-31 ENCOUNTER — Encounter: Payer: Medicare Other | Admitting: *Deleted

## 2010-09-24 ENCOUNTER — Encounter: Payer: Medicare Other | Admitting: *Deleted

## 2011-01-28 ENCOUNTER — Encounter: Payer: Self-pay | Admitting: *Deleted

## 2011-01-28 ENCOUNTER — Telehealth: Payer: Self-pay | Admitting: *Deleted

## 2011-01-28 NOTE — Telephone Encounter (Signed)
SENT OUT LETTER TO PATIENT/TMJ
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
## 2011-04-10 ENCOUNTER — Other Ambulatory Visit: Payer: Self-pay | Admitting: Cardiology

## 2011-05-15 ENCOUNTER — Other Ambulatory Visit: Payer: Self-pay | Admitting: Cardiology

## 2011-06-10 ENCOUNTER — Other Ambulatory Visit: Payer: Self-pay | Admitting: Cardiology

## 2011-06-10 ENCOUNTER — Ambulatory Visit (INDEPENDENT_AMBULATORY_CARE_PROVIDER_SITE_OTHER): Payer: Medicare Other | Admitting: *Deleted

## 2011-06-10 DIAGNOSIS — I749 Embolism and thrombosis of unspecified artery: Secondary | ICD-10-CM

## 2011-06-10 DIAGNOSIS — I4891 Unspecified atrial fibrillation: Secondary | ICD-10-CM

## 2011-06-10 DIAGNOSIS — Z7901 Long term (current) use of anticoagulants: Secondary | ICD-10-CM

## 2011-06-10 LAB — PROTIME-INR
INR: 7.79 (ref <1.50)
Prothrombin Time: 66.6 seconds — ABNORMAL HIGH (ref 11.6–15.2)

## 2011-06-13 ENCOUNTER — Ambulatory Visit (INDEPENDENT_AMBULATORY_CARE_PROVIDER_SITE_OTHER): Payer: Medicare Other | Admitting: *Deleted

## 2011-06-13 DIAGNOSIS — Z7901 Long term (current) use of anticoagulants: Secondary | ICD-10-CM

## 2011-06-13 DIAGNOSIS — I749 Embolism and thrombosis of unspecified artery: Secondary | ICD-10-CM

## 2011-06-13 DIAGNOSIS — I4891 Unspecified atrial fibrillation: Secondary | ICD-10-CM

## 2011-06-27 ENCOUNTER — Encounter: Payer: Self-pay | Admitting: Adult Health

## 2011-06-27 ENCOUNTER — Ambulatory Visit (INDEPENDENT_AMBULATORY_CARE_PROVIDER_SITE_OTHER): Payer: Medicare Other | Admitting: Adult Health

## 2011-06-27 ENCOUNTER — Ambulatory Visit (INDEPENDENT_AMBULATORY_CARE_PROVIDER_SITE_OTHER): Payer: Medicare Other | Admitting: *Deleted

## 2011-06-27 ENCOUNTER — Other Ambulatory Visit: Payer: Self-pay

## 2011-06-27 VITALS — BP 184/96 | HR 44 | Resp 18 | Ht 67.0 in | Wt 112.0 lb

## 2011-06-27 DIAGNOSIS — I495 Sick sinus syndrome: Secondary | ICD-10-CM

## 2011-06-27 DIAGNOSIS — I749 Embolism and thrombosis of unspecified artery: Secondary | ICD-10-CM

## 2011-06-27 DIAGNOSIS — Z7901 Long term (current) use of anticoagulants: Secondary | ICD-10-CM

## 2011-06-27 DIAGNOSIS — I4891 Unspecified atrial fibrillation: Secondary | ICD-10-CM

## 2011-06-27 DIAGNOSIS — I10 Essential (primary) hypertension: Secondary | ICD-10-CM

## 2011-06-27 DIAGNOSIS — I498 Other specified cardiac arrhythmias: Secondary | ICD-10-CM

## 2011-06-27 DIAGNOSIS — R001 Bradycardia, unspecified: Secondary | ICD-10-CM

## 2011-06-27 LAB — POCT INR: INR: 3.6

## 2011-06-27 MED ORDER — HYDROCHLOROTHIAZIDE 25 MG PO TABS: 25.0000 mg | ORAL_TABLET | Freq: Every day | ORAL | Status: DC

## 2011-06-27 MED ORDER — ZOLPIDEM TARTRATE 5 MG PO TABS: 2.5000 mg | ORAL_TABLET | Freq: Every evening | ORAL | Status: DC | PRN

## 2011-06-27 MED ORDER — AMLODIPINE BESYLATE 5 MG PO TABS: 5.0000 mg | ORAL_TABLET | Freq: Every day | ORAL | Status: DC

## 2011-06-27 NOTE — Assessment & Plan Note (Signed)
He is having his INR check completed during this office visit as he is on coumadin.  CBC is also being checked with labs to evaluate for anemia on coumadin.

## 2011-06-27 NOTE — Assessment & Plan Note (Addendum)
Heart rate is in the low 40's without any rate reducing medications on board. Will have him wear a cardiac monitor for a week to evaluate how low his HR goes, checking for significant bradycardia or pauses nocturnally which awaken him from sleep. Check TSH with blood work.

## 2011-06-27 NOTE — Progress Notes (Signed)
   HPI: [name] is a 78 y/o patient of Dr. Dietrich Pates we are seeing for ongoing assessment and treatment of atrail fib/flutter, on coumadin, hypertension, who has not been seen since January of 2012.  His son accompanies him to this appointment. He has not been taking amlodipine or lasix for unknown period of time, as prescribed by Dr. Dietrich Pates. His son wants his medications to be reviewed and prescribed if needed. He is without complaints with the exception of mild dizziness, and insomnia. Son says that he does not sleep well at night and is often up and busy during the night, napping in recliner during the day.  No Known Allergies  Current Outpatient Prescriptions  Medication Sig Dispense Refill  . IRON PO Take by mouth daily.      Marland Kitchen warfarin (COUMADIN) 5 MG tablet TAKE AS DIRECTED BY COUMADIN CLINIC.  30 tablet  0    Past Medical History  Diagnosis Date  . Rheumatic heart disease     moderate mitral stenosis and history of CHF  . Hypertension   . Sick sinus syndrome      Paroxysmal and then constant AF; slow rate on modest AV nodal blocking agents  . Prostate cancer     Carcinoma of the prostate  . Chronic low back pain     Past Surgical History  Procedure Date  . Transurethral resection of prostate     ROS Review of systems complete and found to be negative unless listed above PHYSICAL EXAM BP 184/96  Pulse 44  Resp 18  Ht 5\' 7"  (1.702 m)  Wt 112 lb (50.803 kg)  BMI 17.54 kg/m2 General: Well developed, well nourished,thin, hard of hearing in no acute distress Head: Eyes PERRLA, No xanthomas.   Normal cephalic and atramatic poor dent ation. Lungs: Clear bilaterally to auscultation and percussion. Heart: HRIR S1 S2, bradycardic wth 1/6 systolic murmur..  Pulses are 2+ & equal.            No carotid bruit. No JVD.  No abdominal bruits. No femoral bruits. Abdomen: Bowel sounds are positive, abdomen soft and non-tender without masses or                  Hernia's  noted. Msk:  Back normal, normal gait. Normal strength and tone for age. Extremities: No clubbing, cyanosis or edema.  DP +1 Neuro: Alert and oriented X 3. HOH with some memory loss Psych:  Good affect, responds appropriately EKG: Atrial fib with rate of 44 bpm, RBBB.   ASSESSMENT AND PLAN

## 2011-06-27 NOTE — Patient Instructions (Signed)
**Note De-Identified  Obfuscation** Your physician recommends that you return for lab work in: today  Your physician has recommended that you wear an event monitor. Event monitors are medical devices that record the heart's electrical activity. Doctors most often [place] these monitors to diagnose arrhythmias. Arrhythmias are problems with the speed or rhythm of the heartbeat. The monitor is a small, portable device. You can wear one while you do your normal daily activities. This is usually used to diagnose what is causing palpitations/syncope (passing out).  Your physician has recommended you make the following change in your medication: start taking Norvasc 5 mg daily, HCTZ 25 mg daily and Ambien 2.5 mg as needed at bedtime  Your physician recommends that you schedule a follow-up appointment in: 1 month

## 2011-06-27 NOTE — Assessment & Plan Note (Signed)
Blood pressure is not well controlled and medical noncompliance has been part of the issue.  He has not been taking the amlodipine or Lasix as directed.  Will begin amlodipine at 5 mg daily and add HCTZ 25 mg daily. He was taken off of diltiazem secondary to bradycardia when last seen by Dr. Dietrich Pates. BMET and CBC will be drawn.

## 2011-06-28 LAB — CBC WITH DIFFERENTIAL/PLATELET
Basophils Absolute: 0.1 10*3/uL (ref 0.0–0.1)
Basophils Relative: 2 % — ABNORMAL HIGH (ref 0–1)
Hemoglobin: 13.5 g/dL (ref 13.0–17.0)
MCHC: 32.3 g/dL (ref 30.0–36.0)
Monocytes Relative: 16 % — ABNORMAL HIGH (ref 3–12)
Neutro Abs: 1.5 10*3/uL — ABNORMAL LOW (ref 1.7–7.7)
Neutrophils Relative %: 43 % (ref 43–77)
Platelets: 194 10*3/uL (ref 150–400)
RDW: 15.5 % (ref 11.5–15.5)

## 2011-06-28 LAB — BASIC METABOLIC PANEL
CO2: 28 mEq/L (ref 19–32)
Calcium: 9.2 mg/dL (ref 8.4–10.5)
Creat: 1.1 mg/dL (ref 0.50–1.35)

## 2011-06-28 LAB — TSH: TSH: 1.65 u[IU]/mL (ref 0.350–4.500)

## 2011-07-02 ENCOUNTER — Telehealth: Payer: Self-pay

## 2011-07-02 NOTE — Telephone Encounter (Signed)
Attempting to reach pt. by phone to schedule a time to attach Event monitor but phone is busy, will continue to call./LV

## 2011-07-03 NOTE — Telephone Encounter (Signed)
**Note De-Identified  Obfuscation** Cannot reach pt. by phone again this morning. Pt. Is to wear monitor for 7 days then return to ecardio soon after. Will send letter asking pt. To contact office ASAP./LV

## 2011-07-08 ENCOUNTER — Telehealth: Payer: Self-pay

## 2011-07-08 DIAGNOSIS — I4891 Unspecified atrial fibrillation: Secondary | ICD-10-CM

## 2011-07-08 NOTE — Telephone Encounter (Signed)
**Note De-Identified  Obfuscation** Pt. came into office this morning to have event monitor attached. Pt. advised that he is to wear for 7 days and to call ecardio once he returns home today for further instructions on how to use and care for monitor. Pt. Also advised to mail monitor back next Monday or to return to this office and we will return to ecardio./LV

## 2011-07-15 ENCOUNTER — Telehealth: Payer: Self-pay

## 2011-07-15 NOTE — Telephone Encounter (Signed)
**Note De-Identified  Obfuscation** Per Joni Reining, NP due to atrial fibrillation w/3.0 second pause on Event monitor on 4-27 pt's OV has been moved up to 4-30 @ 2:20, pt's son is aware./LV

## 2011-07-16 ENCOUNTER — Other Ambulatory Visit: Payer: Self-pay | Admitting: Adult Health

## 2011-07-16 ENCOUNTER — Encounter: Payer: Self-pay | Admitting: Adult Health

## 2011-07-16 ENCOUNTER — Ambulatory Visit (INDEPENDENT_AMBULATORY_CARE_PROVIDER_SITE_OTHER): Payer: Medicare Other | Admitting: Adult Health

## 2011-07-16 VITALS — BP 178/86 | HR 47 | Ht 67.0 in | Wt 108.0 lb

## 2011-07-16 DIAGNOSIS — I4891 Unspecified atrial fibrillation: Secondary | ICD-10-CM

## 2011-07-16 DIAGNOSIS — I495 Sick sinus syndrome: Secondary | ICD-10-CM

## 2011-07-16 DIAGNOSIS — I10 Essential (primary) hypertension: Secondary | ICD-10-CM | POA: Insufficient documentation

## 2011-07-16 MED ORDER — OLMESARTAN MEDOXOMIL 40 MG PO TABS: 40.0000 mg | ORAL_TABLET | Freq: Every day | ORAL | Status: DC

## 2011-07-16 NOTE — Patient Instructions (Signed)
**Note De-Identified  Obfuscation** Your physician has recommended you make the following change in your medication: stop taking Norvasc and start taking Benicar 40 mg daily  Your physician recommends that you schedule a follow-up appointment in: 1 week for blood pressure check and in 6 months with provider

## 2011-07-16 NOTE — Progress Notes (Signed)
   HPI: [name] is a 78 y/o patient of Dr. Dietrich Pates we are seeing for ongoing assessment and treatment of atrail fib/flutter, on coumadin, hypertension, He is without complaints with the exception of mild dizziness, and insomnia. Son says that he does not sleep well at night and is often up and busy during the night, napping in recliner during the day. He had not been taking his medications for several months, therefore he was placed back on amlodipine and  HCTZ. Secondary to fatigue and mild dizziness with bradycardia noted, I placed a cardiac monitor for evaluation of significant bradycardia. He comes today without any further complaint.  No Known Allergies  Current Outpatient Prescriptions  Medication Sig Dispense Refill  . hydrochlorothiazide (HYDRODIURIL) 25 MG tablet Take 1 tablet (25 mg total) by mouth daily.  30 tablet  3  . IRON PO Take by mouth daily.      Marland Kitchen warfarin (COUMADIN) 5 MG tablet TAKE AS DIRECTED BY COUMADIN CLINIC.  30 tablet  0  . zolpidem (AMBIEN) 5 MG tablet Take 0.5 tablets (2.5 mg total) by mouth at bedtime as needed for sleep.  15 tablet  1  . olmesartan (BENICAR) 40 MG tablet Take 1 tablet (40 mg total) by mouth daily.  30 tablet  6    Past Medical History  Diagnosis Date  . Rheumatic heart disease     moderate mitral stenosis and history of CHF  . Hypertension   . Sick sinus syndrome      Paroxysmal and then constant AF; slow rate on modest AV nodal blocking agents  . Prostate cancer     Carcinoma of the prostate  . Chronic low back pain     Past Surgical History  Procedure Date  . Transurethral resection of prostate     ROS Review of systems complete and found to be negative unless listed above PHYSICAL EXAM BP 178/86  Pulse 47  Ht 5\' 7"  (1.702 m)  Wt 108 lb (48.988 kg)  BMI 16.92 kg/m2 General: Well developed, well nourished,thin, hard of hearing in no acute distress, smelling of urine. Head: Eyes PERRLA, No xanthomas.   Normal cephalic and  atramatic poor dent ation. Lungs: Clear bilaterally to auscultation and percussion. Heart: HRIR S1 S2, bradycardic wth 1/6 systolic murmur..  Pulses are 2+ & equal.            No carotid bruit. No JVD.  No abdominal bruits. No femoral bruits. Abdomen: Bowel sounds are positive, abdomen soft and non-tender without masses or                  Hernia's noted. Msk:  Back normal, normal gait. Normal strength and tone for age. Extremities: No clubbing, cyanosis or edema.  DP +1 Neuro: Alert and oriented X 3. HOH with some memory loss Psych:  Good affect, responds appropriately EKG: Atrial fib with rate of 44 bpm, RBBB.   ASSESSMENT AND PLAN

## 2011-07-16 NOTE — Assessment & Plan Note (Signed)
Blood pressure is not well controlled on current medication regimen.  This maybe reflective of his bradycardia as well. I have changed norvasc to Benicar 40 mg and will continue HCTZ 25 mg daily.  I have reviewed his labs drawn on last visit. Creatinine 1.10, Potassium 4.0. He is not anemic. He will follow-up in one week for BP check.

## 2011-07-16 NOTE — Assessment & Plan Note (Signed)
Review of cardiac monitor telemetry demonstrates one episode of significant bradycardia with a 3.2 second pause at 8:55 pm. The patient was asymptomatic, Average HR 50 bpm in atrial fib. He denied chest pain or dizziness, shortness of breath. A walking rhythm strip was completed her in the office with HR ranging from from 40 -44 bpm before and after walk without symptoms.  I have discussed with the patients son the eventual need for pacemaker.He is not on rate reducing medication.  As he is currently asymptomatic, his son wishes to wait to do this. I am concerned about possible syncopal episodes with significant bradycardia on coumadin for atrial fib. I have discussed this with his son as well. His father is active, working in his yard per his son. Will wait to refer to EP for pacemaker until he becomes symptomatic or his son reconsiders and wishes to proceed with EP evaluation. In light of mild bradycardia, I will discontinue amlodipine as this is a modest AV blocking agent, and change to Benicar 40 mg daily.

## 2011-07-18 ENCOUNTER — Ambulatory Visit: Payer: Medicare Other | Admitting: Adult Health

## 2011-08-02 ENCOUNTER — Ambulatory Visit: Payer: Medicare Other | Admitting: Cardiology

## 2011-10-24 ENCOUNTER — Ambulatory Visit (INDEPENDENT_AMBULATORY_CARE_PROVIDER_SITE_OTHER): Payer: Medicare Other | Admitting: *Deleted

## 2011-10-24 DIAGNOSIS — I4891 Unspecified atrial fibrillation: Secondary | ICD-10-CM

## 2011-10-24 DIAGNOSIS — Z7901 Long term (current) use of anticoagulants: Secondary | ICD-10-CM

## 2011-10-24 LAB — POCT INR: INR: 1.1

## 2011-10-24 MED ORDER — WARFARIN SODIUM 5 MG PO TABS: 5.0000 mg | ORAL_TABLET | ORAL | Status: DC

## 2011-10-24 NOTE — Patient Instructions (Signed)
Son brought pt today.  Discussed medical non-compliance.  Pt has not had INR checked since 4/13.  Son states his sister has been giving pt his meds, (He is not capable of doing so himself) but with INR 1.1 today it is obvious he has not been getting his coumadin.  Explained risks of improper anticoagulation to son.  Discussed ASA vs coumadin in this situation, but son wishes to leave pt on coumadin.  States he will make sure his sister is giving pt his coumadin.  Pt restarted on coumadin and f/u INR in 1 wk.  Son states his niece will bring pt to appt.

## 2011-11-07 ENCOUNTER — Ambulatory Visit (INDEPENDENT_AMBULATORY_CARE_PROVIDER_SITE_OTHER): Payer: Medicare Other | Admitting: *Deleted

## 2011-11-07 DIAGNOSIS — Z7901 Long term (current) use of anticoagulants: Secondary | ICD-10-CM

## 2011-11-07 DIAGNOSIS — I4891 Unspecified atrial fibrillation: Secondary | ICD-10-CM

## 2011-11-07 LAB — POCT INR: INR: 2.1

## 2011-11-28 ENCOUNTER — Ambulatory Visit (INDEPENDENT_AMBULATORY_CARE_PROVIDER_SITE_OTHER): Payer: Medicare Other | Admitting: *Deleted

## 2011-11-28 DIAGNOSIS — Z7901 Long term (current) use of anticoagulants: Secondary | ICD-10-CM

## 2011-11-28 DIAGNOSIS — I4891 Unspecified atrial fibrillation: Secondary | ICD-10-CM

## 2011-12-05 ENCOUNTER — Encounter (HOSPITAL_COMMUNITY): Payer: Self-pay | Admitting: *Deleted

## 2011-12-05 ENCOUNTER — Inpatient Hospital Stay (HOSPITAL_COMMUNITY)
Admission: EM | Admit: 2011-12-05 | Discharge: 2011-12-09 | DRG: 310 | Disposition: A | Discharge status: Home / Self Care | Payer: Medicare Other | Attending: Internal Medicine | Admitting: Internal Medicine

## 2011-12-05 DIAGNOSIS — I050 Rheumatic mitral stenosis: Secondary | ICD-10-CM | POA: Diagnosis present

## 2011-12-05 DIAGNOSIS — M545 Low back pain, unspecified: Secondary | ICD-10-CM | POA: Diagnosis present

## 2011-12-05 DIAGNOSIS — E162 Hypoglycemia, unspecified: Secondary | ICD-10-CM | POA: Diagnosis present

## 2011-12-05 DIAGNOSIS — G8929 Other chronic pain: Secondary | ICD-10-CM | POA: Diagnosis present

## 2011-12-05 DIAGNOSIS — I509 Heart failure, unspecified: Secondary | ICD-10-CM | POA: Diagnosis present

## 2011-12-05 DIAGNOSIS — N390 Urinary tract infection, site not specified: Secondary | ICD-10-CM

## 2011-12-05 DIAGNOSIS — Z79899 Other long term (current) drug therapy: Secondary | ICD-10-CM

## 2011-12-05 DIAGNOSIS — I099 Rheumatic heart disease, unspecified: Secondary | ICD-10-CM | POA: Diagnosis present

## 2011-12-05 DIAGNOSIS — E785 Hyperlipidemia, unspecified: Secondary | ICD-10-CM

## 2011-12-05 DIAGNOSIS — I4891 Unspecified atrial fibrillation: Secondary | ICD-10-CM | POA: Diagnosis present

## 2011-12-05 DIAGNOSIS — Z8679 Personal history of other diseases of the circulatory system: Secondary | ICD-10-CM

## 2011-12-05 DIAGNOSIS — I10 Essential (primary) hypertension: Secondary | ICD-10-CM | POA: Diagnosis present

## 2011-12-05 DIAGNOSIS — R4182 Altered mental status, unspecified: Secondary | ICD-10-CM

## 2011-12-05 DIAGNOSIS — Z8546 Personal history of malignant neoplasm of prostate: Secondary | ICD-10-CM

## 2011-12-05 DIAGNOSIS — R55 Syncope and collapse: Secondary | ICD-10-CM

## 2011-12-05 DIAGNOSIS — R32 Unspecified urinary incontinence: Secondary | ICD-10-CM | POA: Diagnosis present

## 2011-12-05 DIAGNOSIS — Z7901 Long term (current) use of anticoagulants: Secondary | ICD-10-CM

## 2011-12-05 DIAGNOSIS — R001 Bradycardia, unspecified: Secondary | ICD-10-CM

## 2011-12-05 DIAGNOSIS — I495 Sick sinus syndrome: Principal | ICD-10-CM | POA: Diagnosis present

## 2011-12-05 DIAGNOSIS — F0390 Unspecified dementia without behavioral disturbance: Secondary | ICD-10-CM | POA: Diagnosis present

## 2011-12-05 HISTORY — DX: Vascular disorder of intestine, unspecified: K55.9

## 2011-12-05 HISTORY — DX: Peripheral vascular disease, unspecified: I73.9

## 2011-12-05 HISTORY — DX: Unspecified atrial fibrillation: I48.91

## 2011-12-05 HISTORY — DX: Unspecified dementia, unspecified severity, without behavioral disturbance, psychotic disturbance, mood disturbance, and anxiety: F03.90

## 2011-12-05 NOTE — ED Notes (Signed)
Pt arrived by EMS. Called out for stroke like symptoms. Family reports pt was walking & just leaned against wall. When EMS arrived all symptoms had gone. Pt hypertensive w/ EMS.

## 2011-12-06 ENCOUNTER — Emergency Department (HOSPITAL_COMMUNITY): Payer: Medicare Other

## 2011-12-06 ENCOUNTER — Encounter (HOSPITAL_COMMUNITY): Payer: Self-pay | Admitting: Emergency Medicine

## 2011-12-06 DIAGNOSIS — R55 Syncope and collapse: Secondary | ICD-10-CM

## 2011-12-06 DIAGNOSIS — F0390 Unspecified dementia without behavioral disturbance: Secondary | ICD-10-CM | POA: Diagnosis present

## 2011-12-06 DIAGNOSIS — I4891 Unspecified atrial fibrillation: Secondary | ICD-10-CM

## 2011-12-06 DIAGNOSIS — I498 Other specified cardiac arrhythmias: Secondary | ICD-10-CM

## 2011-12-06 DIAGNOSIS — R32 Unspecified urinary incontinence: Secondary | ICD-10-CM | POA: Diagnosis present

## 2011-12-06 DIAGNOSIS — I10 Essential (primary) hypertension: Secondary | ICD-10-CM

## 2011-12-06 DIAGNOSIS — N390 Urinary tract infection, site not specified: Secondary | ICD-10-CM

## 2011-12-06 DIAGNOSIS — R001 Bradycardia, unspecified: Secondary | ICD-10-CM | POA: Diagnosis present

## 2011-12-06 DIAGNOSIS — I495 Sick sinus syndrome: Principal | ICD-10-CM

## 2011-12-06 LAB — RAPID URINE DRUG SCREEN, HOSP PERFORMED
Amphetamines: NOT DETECTED
Barbiturates: NOT DETECTED
Benzodiazepines: NOT DETECTED
Tetrahydrocannabinol: NOT DETECTED

## 2011-12-06 LAB — HEPATIC FUNCTION PANEL
ALT: 13 U/L (ref 0–53)
AST: 28 U/L (ref 0–37)
Albumin: 3.6 g/dL (ref 3.5–5.2)
Alkaline Phosphatase: 86 U/L (ref 39–117)
Total Protein: 7.7 g/dL (ref 6.0–8.3)

## 2011-12-06 LAB — URINE CULTURE

## 2011-12-06 LAB — CBC WITH DIFFERENTIAL/PLATELET
Basophils Relative: 1 % (ref 0–1)
Eosinophils Absolute: 0.2 10*3/uL (ref 0.0–0.7)
Eosinophils Relative: 5 % (ref 0–5)
Hemoglobin: 13.8 g/dL (ref 13.0–17.0)
MCH: 28.5 pg (ref 26.0–34.0)
MCHC: 33.4 g/dL (ref 30.0–36.0)
MCV: 85.2 fL (ref 78.0–100.0)
Monocytes Absolute: 0.5 10*3/uL (ref 0.1–1.0)
Monocytes Relative: 14 % — ABNORMAL HIGH (ref 3–12)
Neutrophils Relative %: 53 % (ref 43–77)

## 2011-12-06 LAB — BASIC METABOLIC PANEL
BUN: 22 mg/dL (ref 6–23)
Calcium: 9.6 mg/dL (ref 8.4–10.5)
Creatinine, Ser: 0.96 mg/dL (ref 0.50–1.35)
GFR calc Af Amer: 86 mL/min — ABNORMAL LOW (ref >90)
GFR calc non Af Amer: 74 mL/min — ABNORMAL LOW (ref >90)
Glucose, Bld: 87 mg/dL (ref 70–99)
Potassium: 4.4 mEq/L (ref 3.5–5.1)

## 2011-12-06 LAB — URINE MICROSCOPIC-ADD ON

## 2011-12-06 LAB — URINALYSIS, ROUTINE W REFLEX MICROSCOPIC
Bilirubin Urine: NEGATIVE
Ketones, ur: NEGATIVE mg/dL
Leukocytes, UA: NEGATIVE
Nitrite: NEGATIVE
Protein, ur: 30 mg/dL — AB

## 2011-12-06 LAB — GLUCOSE, CAPILLARY: Glucose-Capillary: 69 mg/dL — ABNORMAL LOW (ref 70–99)

## 2011-12-06 LAB — TROPONIN I: Troponin I: <0.3 ng/mL (ref <0.30)

## 2011-12-06 MED ORDER — ONDANSETRON HCL 4 MG PO TABS
4.0000 mg | ORAL_TABLET | Freq: Four times a day (QID) | ORAL | Status: DC | PRN
  Filled 2011-12-06: qty 1

## 2011-12-06 MED ORDER — DEXTROSE 5 % IV SOLN
1.0000 g | Freq: Every day | INTRAVENOUS | Status: DC
  Administered 2011-12-06: 1 g via INTRAVENOUS
  Filled 2011-12-06 (×3): qty 10

## 2011-12-06 MED ORDER — ACETAMINOPHEN 650 MG RE SUPP: 650.0000 mg | Freq: Four times a day (QID) | RECTAL | Status: DC | PRN

## 2011-12-06 MED ORDER — HYDRALAZINE HCL 25 MG PO TABS
50.0000 mg | ORAL_TABLET | Freq: Three times a day (TID) | ORAL | Status: DC
  Administered 2011-12-06 – 2011-12-08 (×7): 50 mg via ORAL
  Filled 2011-12-06 (×7): qty 2

## 2011-12-06 MED ORDER — POTASSIUM CHLORIDE IN NACL 20-0.9 MEQ/L-% IV SOLN
INTRAVENOUS | Status: DC
  Administered 2011-12-06 (×2): via INTRAVENOUS

## 2011-12-06 MED ORDER — ONDANSETRON HCL 4 MG/2ML IJ SOLN: 4.0000 mg | Freq: Four times a day (QID) | INTRAMUSCULAR | Status: DC | PRN

## 2011-12-06 MED ORDER — TRAZODONE HCL 50 MG PO TABS: 25.0000 mg | ORAL_TABLET | Freq: Every evening | ORAL | Status: DC | PRN

## 2011-12-06 MED ORDER — DEXTROSE 5 % IV SOLN
1.0000 g | Freq: Once | INTRAVENOUS | Status: AC
  Administered 2011-12-06: 1 g via INTRAVENOUS
  Filled 2011-12-06: qty 10

## 2011-12-06 MED ORDER — ACETAMINOPHEN 325 MG PO TABS
650.0000 mg | ORAL_TABLET | ORAL | Status: DC | PRN
  Administered 2011-12-09: 650 mg via ORAL
  Filled 2011-12-06: qty 2

## 2011-12-06 MED ORDER — IRBESARTAN 300 MG PO TABS
300.0000 mg | ORAL_TABLET | Freq: Every day | ORAL | Status: DC
  Administered 2011-12-06 – 2011-12-09 (×4): 300 mg via ORAL
  Filled 2011-12-06 (×4): qty 1

## 2011-12-06 MED ORDER — HYDRALAZINE HCL 20 MG/ML IJ SOLN
10.0000 mg | INTRAMUSCULAR | Status: DC | PRN
  Administered 2011-12-06 – 2011-12-08 (×2): 10 mg via INTRAVENOUS
  Filled 2011-12-06 (×2): qty 1

## 2011-12-06 MED ORDER — SODIUM CHLORIDE 0.9 % IJ SOLN
3.0000 mL | Freq: Two times a day (BID) | INTRAMUSCULAR | Status: DC
  Administered 2011-12-06 – 2011-12-08 (×3): 3 mL via INTRAVENOUS
  Filled 2011-12-06 (×3): qty 3

## 2011-12-06 NOTE — ED Notes (Signed)
Patient transported to CT 

## 2011-12-06 NOTE — ED Notes (Signed)
Pt incontinent of urine. Sheets & bed linens changed. Pt washed off & chuck placed under pt.

## 2011-12-06 NOTE — H&P (Signed)
[facility] History and Physical  [name]  ZOX:[id number]  DOB: 12/02/1924   DOA: 12/06/2011   PCP:   Kirk Ruths, MD   Chief Complaint:  Episode of dizziness and confusion  HPI: [name] is an 78 y.o. male.   Small framed elderly African American gentleman, with mild dementia, chronic atrial fibrillation on Coumadin, known sick sinus syndrome chronic bradycardia, uncontrolled hypertension, was brought in by his son because he had an episode of sudden weakness, near loss of consciousness and was leaning up against a wall looking dazed and confused. Episode resolved fairly quickly and patient did not fall to the ground. No focal lateralizing weakness was noted.  The patient's severe bradycardia typically in the 40s, he did have our outpatient continuous cardiac monitoring a few months ago and was noted to have episodes of venous pauses greater than 3 seconds. He is a fairly active gentleman out in his yard without any mechanical assistance for ambulation, and a pacemaker placement was offered. [name] is some declined because he had noted no symptoms of this bradycardia.  Patient's blood pressure is currently treated with Benicar and HCTZ; amlodipine had been switched because it has mild AV blocking functions. Patient's son reports that he has urinary incontinence related to the use of HCTZ. Patient's blood pressure remains elevated on his current medical regimen  There is no history of chest pains or shortness of breath no headaches no fever cough or cold.    Rewiew of Systems:   All systems negative except as marked bold or noted in the HPI;  Constitutional: Negative for malaise, fever and chills. ;  Eyes: Negative for eye pain, redness and discharge. ;  ENMT: Negative for ear pain, hoarseness, nasal congestion, sinus pressure and sore throat. ;  Cardiovascular: Negative for chest pain, palpitations, diaphoresis, dyspnea and peripheral edema. ;  Respiratory:  Negative for cough, hemoptysis, wheezing and stridor. ;  Gastrointestinal: Negative for nausea, vomiting, diarrhea, constipation, abdominal pain, melena, blood in stool, hematemesis, jaundice and rectal bleeding. unusual weight loss..   Genitourinary: Negative for frequency, dysuria, incontinence,flank pain and hematuria; Musculoskeletal: Negative for back pain and neck pain. Negative for swelling and trauma.;  Skin: . Negative for pruritus, rash, abrasions, bruising and skin lesion.; ulcerations Neuro: Negative for headache, lightheadedness and neck stiffness. Negative for weakness, altered level of consciousness , altered mental status, extremity weakness, burning feet, involuntary movement, seizure and syncope.  Psych: negative for anxiety, depression, insomnia, tearfulness, panic attacks, hallucinations, paranoia, suicidal or homicidal ideation    Past Medical History  Diagnosis Date  . Rheumatic heart disease     moderate mitral stenosis and history of CHF  . Hypertension   . Sick sinus syndrome      Paroxysmal and then constant AF; slow rate on modest AV nodal blocking agents  . Prostate cancer     Carcinoma of the prostate  . Chronic low back pain   . Dementia     Past Surgical History  Procedure Date  . Transurethral resection of prostate     Medications:  HOME MEDS: Prior to Admission medications   Medication Sig Start Date End Date Taking? Authorizing Provider  hydrochlorothiazide (HYDRODIURIL) 25 MG tablet Take 1 tablet (25 mg total) by mouth daily. 06/27/11 06/26/12 Yes Jodelle Gross, NP  iron polysaccharides (NIFEREX) 150 MG capsule Take 150 mg by mouth daily.   Yes Historical Provider, MD  olmesartan (BENICAR) 40 MG tablet Take 1 tablet (40 mg total) by mouth  daily. 07/16/11 07/15/12 Yes Jodelle Gross, NP  warfarin (COUMADIN) 5 MG tablet Take 1 tablet (5 mg total) by mouth as directed. As of 10/24/11 Take coumadin 1/2 tablet daily except 1 tablet on Tuesdays and  Fridays 10/24/11  Yes Kathlen Brunswick, MD  zolpidem (AMBIEN) 5 MG tablet Take 2.5 mg by mouth at bedtime as needed.   Yes Historical Provider, MD  IRON PO Take by mouth daily.    Historical Provider, MD  zolpidem (AMBIEN) 5 MG tablet Take 0.5 tablets (2.5 mg total) by mouth at bedtime as needed for sleep. 06/27/11 07/27/11  Jodelle Gross, NP     Allergies:  No Known Allergies  Social History:   reports that he has never smoked. He has never used smokeless tobacco. He reports that he does not drink alcohol or use illicit drugs.  Family History: No family history on file.   Physical Exam: Filed Vitals:   12/06/11 0100 12/06/11 0132 12/06/11 0259 12/06/11 0500  BP: 224/97 202/81 196/94 195/99  Pulse:  47 56 48  Temp:      TempSrc:      Resp: 20 18 18 19   Height:      Weight:      SpO2:  94% 99% 98%   Blood pressure 195/99, pulse 48, temperature 97.4 F (36.3 C), temperature source Oral, resp. rate 19, height 5\' 9"  (1.753 m), weight 52.164 kg (115 lb), SpO2 98.00%.  GEN:  Pleasant thin small framed African American gentleman lying in the stretcher in no acute distress; cooperative with exam PSYCH:  alert and oriented x2; does not appear anxious or depressed; affect is appropriate. HEENT: Mucous membranes pink and anicteric; PERRLA; EOM intact; no cervical lymphadenopathy nor thyromegaly or carotid bruit; no JVD; wasting of the muscles around the head and neck Breasts:: Not examined CHEST WALL: No tenderness CHEST: Normal respiration, clear to auscultation bilaterally HEART: Bradycardic irregularly irregular rhythm; no murmurs rubs or gallops BACK:  no CVA tenderness ABDOMEN: Scaphoid soft non-tender; no masses, no organomegaly, normal abdominal bowel sounds; no pannus; no intertriginous candida; patient is incontinent of urine but bladder seems somewhat distended Rectal Exam: Not done EXTREMITIES:  age-appropriate arthropathy of the hands and knees; no edema; no  ulcerations. Genitalia: not examined; patient is incontinent of urine PULSES: 2+ and symmetric SKIN: Normal hydration no rash or ulceration CNS: Cranial nerves 2-12 grossly intact no focal lateralizing neurologic deficit   Labs on Admission:  Basic Metabolic Panel:  Lab 12/06/11 4098  NA 136  K 4.4  CL 101  CO2 27  GLUCOSE 87  BUN 22  CREATININE 0.96  CALCIUM 9.6  MG --  PHOS --   Liver Function Tests: No results found for this basename: AST:5,ALT:5,ALKPHOS:5,BILITOT:5,PROT:5,ALBUMIN:5 in the last 168 hours No results found for this basename: LIPASE:5,AMYLASE:5 in the last 168 hours No results found for this basename: AMMONIA:5 in the last 168 hours CBC:  Lab 12/06/11 0047  WBC 3.9*  NEUTROABS 2.1  HGB 13.8  HCT 41.3  MCV 85.2  PLT 178   Cardiac Enzymes:  Lab 12/06/11 0047  CKTOTAL --  CKMB --  CKMBINDEX --  TROPONINI <0.30   BNP: No components found with this basename: POCBNP:5 D-dimer: No components found with this basename: D-DIMER:5 CBG:  Lab 12/06/11 0052  GLUCAP 69*    Results for orders placed during the hospital encounter of 12/05/11 (from the past 48 hour(s))  CBC WITH DIFFERENTIAL     Status: Abnormal   Collection  Time   12/06/11 12:47 AM      Component Value Range Comment   WBC 3.9 (*) 4.0 - 10.5 K/uL    RBC 4.85  4.22 - 5.81 MIL/uL    Hemoglobin 13.8  13.0 - 17.0 g/dL    HCT 16.1  09.6 - 04.5 %    MCV 85.2  78.0 - 100.0 fL    MCH 28.5  26.0 - 34.0 pg    MCHC 33.4  30.0 - 36.0 g/dL    RDW 40.9  81.1 - 91.4 %    Platelets 178  150 - 400 K/uL    Neutrophils Relative 53  43 - 77 %    Neutro Abs 2.1  1.7 - 7.7 K/uL    Lymphocytes Relative 27  12 - 46 %    Lymphs Abs 1.0  0.7 - 4.0 K/uL    Monocytes Relative 14 (*) 3 - 12 %    Monocytes Absolute 0.5  0.1 - 1.0 K/uL    Eosinophils Relative 5  0 - 5 %    Eosinophils Absolute 0.2  0.0 - 0.7 K/uL    Basophils Relative 1  0 - 1 %    Basophils Absolute 0.0  0.0 - 0.1 K/uL   BASIC METABOLIC  PANEL     Status: Abnormal   Collection Time   12/06/11 12:47 AM      Component Value Range Comment   Sodium 136  135 - 145 mEq/L    Potassium 4.4  3.5 - 5.1 mEq/L    Chloride 101  96 - 112 mEq/L    CO2 27  19 - 32 mEq/L    Glucose, Bld 87  70 - 99 mg/dL    BUN 22  6 - 23 mg/dL    Creatinine, Ser 7.82  0.50 - 1.35 mg/dL    Calcium 9.6  8.4 - 95.6 mg/dL    GFR calc non Af Amer 74 (*) >90 mL/min    GFR calc Af Amer 86 (*) >90 mL/min   PROTIME-INR     Status: Abnormal   Collection Time   12/06/11 12:47 AM      Component Value Range Comment   Prothrombin Time 23.1 (*) 11.6 - 15.2 seconds    INR 2.15 (*) 0.00 - 1.49   APTT     Status: Abnormal   Collection Time   12/06/11 12:47 AM      Component Value Range Comment   aPTT 42 (*) 24 - 37 seconds   TROPONIN I     Status: Normal   Collection Time   12/06/11 12:47 AM      Component Value Range Comment   Troponin I <0.30  <0.30 ng/mL   URINE RAPID DRUG SCREEN (HOSP PERFORMED)     Status: Normal   Collection Time   12/06/11 12:49 AM      Component Value Range Comment   Opiates NONE DETECTED  NONE DETECTED    Cocaine NONE DETECTED  NONE DETECTED    Benzodiazepines NONE DETECTED  NONE DETECTED    Amphetamines NONE DETECTED  NONE DETECTED    Tetrahydrocannabinol NONE DETECTED  NONE DETECTED    Barbiturates NONE DETECTED  NONE DETECTED   URINALYSIS, ROUTINE W REFLEX MICROSCOPIC     Status: Abnormal   Collection Time   12/06/11 12:49 AM      Component Value Range Comment   Color, Urine YELLOW  YELLOW    APPearance HAZY (*) CLEAR    Specific  Gravity, Urine 1.020  1.005 - 1.030    pH 7.5  5.0 - 8.0    Glucose, UA NEGATIVE  NEGATIVE mg/dL    Hgb urine dipstick SMALL (*) NEGATIVE    Bilirubin Urine NEGATIVE  NEGATIVE    Ketones, ur NEGATIVE  NEGATIVE mg/dL    Protein, ur 30 (*) NEGATIVE mg/dL    Urobilinogen, UA 0.2  0.0 - 1.0 mg/dL    Nitrite NEGATIVE  NEGATIVE    Leukocytes, UA NEGATIVE  NEGATIVE   URINE MICROSCOPIC-ADD ON     Status:  Abnormal   Collection Time   12/06/11 12:49 AM      Component Value Range Comment   WBC, UA 11-20  <3 WBC/hpf    RBC / HPF 0-2  <3 RBC/hpf    Bacteria, UA MANY (*) RARE    Crystals TRIPLE PHOSPHATE CRYSTALS (*) NEGATIVE   GLUCOSE, CAPILLARY     Status: Abnormal   Collection Time   12/06/11 12:52 AM      Component Value Range Comment   Glucose-Capillary 69 (*) 70 - 99 mg/dL      Radiological Exams on Admission: Dg Chest 2 View  12/06/2011  *RADIOLOGY REPORT*  Clinical Data: Weakness.  CHEST - 2 VIEW  Comparison: Plain films of the chest 05/01/2007 and 06/30/2008.  Findings: Marked cardiomegaly is again seen. Upper lobe scar on the left is unchanged.  Lungs are otherwise clear.  No pneumothorax or pleural fluid.  IMPRESSION: Cardiomegaly without acute disease.   Original Report Authenticated By: Bernadene Bell. Maricela Curet, M.D.    Ct Head Wo Contrast  12/06/2011  *RADIOLOGY REPORT*  Clinical Data: Weakness.  CT HEAD WITHOUT CONTRAST  Technique:  Contiguous axial images were obtained from the base of the skull through the vertex without contrast.  Comparison: None.  Findings: There is cortical atrophy and chronic microvascular ischemic change.  No evidence of acute infarction, hemorrhage, mass lesion, mass effect, midline shift or abnormal extra-axial fluid collection.  No hydrocephalus or pneumocephalus.  Calvarium intact.  IMPRESSION: No acute finding.  Atrophy and chronic microvascular ischemic change.   Original Report Authenticated By: Bernadene Bell. Maricela Curet, M.D.     Assessment/Plan Present on Admission:  .Symptomatic bradycardia .SICK SINUS SYNDROME .Atrial fibrillation .Hypertension .Syncope .Dementia .Incontinence of urine Possible UTI   PLAN: Since patient's symptoms are likely resulting from cardiac dysrhythmia/ sinus arrest, will admit patient for cardiology consult with a view to pacemaker placement.  We'll add hydralazine to his medical regimen for improved control of blood  pressure, but he will likely need a pacemaker to optimize blood pressure management.  Will treat presumed urinary tract infection with ceftriaxone pending urine cultures.  Since a pacemaker is unlikely to be placed while his urine is infected without an identified organism, we'll not reverse his Coumadin at this time, but will not restart it until after the cardiology consult, and we will go ahead and give him a heart healthy diet.  Other plans as per orders.  Code Status: FULL CODE  Family Communication: BETTE, FROMAN [phone] cell,  And   [phone] (cell) Disposition Plan: Pending cardiology evaluation    Jacob Chamblee Nocturnist [facility] Pager [phone]   12/06/2011, 5:42 AM

## 2011-12-06 NOTE — Progress Notes (Signed)
UR Chart Review Completed  

## 2011-12-06 NOTE — Consult Note (Signed)
CARDIOLOGY CONSULT NOTE  Patient ID: [name], MRN: [id number], DOB/AGE: 76-Nov-1926 76 y.o. Admit date: 12/05/2011   Date of Consult: 12/06/2011 Primary Physician: Kirk Ruths, MD Primary Cardiologist: Dr. Dietrich Pates  Chief Complaint: dizziness, confusion Reason for Consult: bradycardia  HPI: [name] is an 78 y/o M with hx of mild dementia, chronic atrial fibrillation on Coumadin, known sick sinus syndrome/chronic bradycardia, rheumatic heart dz with mod MS, uncontrolled HTN, remote hx of PVD and mesenteric ischemia s/p SMA emobectomy. He presented with complaints of an episode of sudden weakness. The patient is by himself in the room and does not recall many details so much of the history is obtained from the chart. He reportedly had near loss of consciousness and was leaning up against a wall looking dazed and confused. Episode resolved fairly quickly and patient did not fall to the ground. No focal lateralizing weakness was noted. The patient denies any chest pain, SOB, heart fluttering or other recent syncope. He reportedly remains active. He was last seen in the OP setting 06/2011 by Joni Reining at which time his bradycardia was assessed. He had OP cardiac monitoring showing a 3.2sec pause. He was not having syncopal episodes at that time and was able to remain active, thus the decision was made to hold off on EP eval/pacemaker. A walking rhythm strip in the office at that time showed HR 40-44. Normal TSH at that time. His Norvasc was changed to Benicar.  Upon admission to the hospital yesterday, his BP was extremely high at 223/101. At home he is on Benicar 40mg  daily and HCTZ 25mg  daily. Here he has been placed on hydralazine and irbesartan. He also got IV hydralazine at 0549 this AM with decrease in BP to 162/94.  Tele thus far has shown HRs from mid 40s-80s with intermittent PVCs and nonsustained wide complex beats (up to 5 beats). No sustained pauses but he has also not had  recurrent syncope/weakness since he's been here. Labs thus far: Neg troponin x 1. BMET unremarkable. Glucose was low at 69 this AM. INR therapeutic. UDS neg. CXR showed cardiomegaly without acute disease. CT head: no acute finding, +atrophy and chronic microvascular ischemic change.  Past Medical History  Diagnosis Date  . Rheumatic heart disease     moderate mitral stenosis and history of CHF  . Hypertension   . Sick sinus syndrome      Paroxysmal and then constant AF; slow rate on modest AV nodal blocking agents  . Prostate cancer     Carcinoma of the prostate  . Chronic low back pain   . Dementia       Most Recent Cardiac Studies: 2D Echo 03/2010 Study Conclusions - Left ventricle: The cavity size was normal. There was mild   concentric hypertrophy. Systolic function was normal. The   estimated ejection fraction was in the range of 55% to 60%. Wall   motion was normal; there were no regional wall motion   abnormalities. - Ventricular septum: Mild septaldyssynergy and diastolic   flattening. - Aortic valve: Mild regurgitation. - Mitral valve: Calcified annulus. Mildly to moderately thickened   leaflets . Mobility was moderately restricted consistent with   rheumatic MS. The findings are consistent with moderate stenosis.   Mild regurgitation. Valve area by pressure half-time: 1.05cm\S\2. - Left atrium: The atrium was moderately dilated. - Right atrium: The atrium was moderately dilated. - Atrial septum: No defect or patent foramen ovale was identified. - Tricuspid valve: Moderate regurgitation. - Pulmonary arteries:  Systolic pressure was mildly increased. PA   peak pressure: 58mm Hg (S). Transthoracic echocardiography   Surgical History:  Past Surgical History  Procedure Date  . Transurethral resection of prostate      Home Meds: Prior to Admission medications   Medication Sig Start Date End Date Taking? Authorizing Provider  hydrochlorothiazide (HYDRODIURIL) 25 MG  tablet Take 1 tablet (25 mg total) by mouth daily. 06/27/11 06/26/12 Yes Jodelle Gross, NP  iron polysaccharides (NIFEREX) 150 MG capsule Take 150 mg by mouth daily.   Yes Historical Provider, MD  olmesartan (BENICAR) 40 MG tablet Take 1 tablet (40 mg total) by mouth daily. 07/16/11 07/15/12 Yes Jodelle Gross, NP  warfarin (COUMADIN) 5 MG tablet Take 1 tablet (5 mg total) by mouth as directed. As of 10/24/11 Take coumadin 1/2 tablet daily except 1 tablet on Tuesdays and Fridays 10/24/11  Yes Kathlen Brunswick, MD  zolpidem (AMBIEN) 5 MG tablet Take 2.5 mg by mouth at bedtime as needed.   Yes Historical Provider, MD  IRON PO Take by mouth daily.    Historical Provider, MD  zolpidem (AMBIEN) 5 MG tablet Take 0.5 tablets (2.5 mg total) by mouth at bedtime as needed for sleep. 06/27/11 07/27/11  Jodelle Gross, NP    Inpatient Medications:    . cefTRIAXone (ROCEPHIN)  IV  1 g Intravenous Once  . cefTRIAXone (ROCEPHIN)  IV  1 g Intravenous QHS  . hydrALAZINE  50 mg Oral Q8H  . irbesartan  300 mg Oral Daily  . sodium chloride  3 mL Intravenous Q12H    Allergies: No Known Allergies  History   Social History  . Marital Status: Widowed    Spouse Name: N/A    Number of Children: N/A  . Years of Education: N/A   Occupational History  . retired     yard work Materials engineer   Social History Main Topics  . Smoking status: Never Smoker   . Smokeless tobacco: Never Used  . Alcohol Use: No  . Drug Use: No  . Sexually Active: Not on file   Other Topics Concern  . Not on file   Social History Narrative  . No narrative on file     Family History: patient does not know of his family history  Review of Systems: limited given patient's dementia General: negative for chills, fever, night sweats Cardiovascular: see above Dermatological: negative for rash Respiratory: negative for cough or wheezing Urologic: negative for hematuria Abdominal: negative for nausea, vomiting, diarrhea. He  denies any rectal bleeding or dark stools Neurologic: see above All other systems reviewed and are otherwise negative except as noted above.  Labs:  [facility] 12/06/11 0047  CKTOTAL --  CKMB --  TROPONINI <0.30   Lab Results  Component Value Date   WBC 3.9* 12/06/2011   HGB 13.8 12/06/2011   HCT 41.3 12/06/2011   MCV 85.2 12/06/2011   PLT 178 12/06/2011    Lab 12/06/11 0047  NA 136  K 4.4  CL 101  CO2 27  BUN 22  CREATININE 0.96  CALCIUM 9.6  PROT 7.7  BILITOT 0.4  ALKPHOS 86  ALT 13  AST 28  GLUCOSE 87   Lab Results  Component Value Date   CHOL 219* 09/22/2009   HDL 56 09/22/2009   LDLCALC 191* 09/22/2009   TRIG 104 09/22/2009    Radiology/Studies:  Dg Chest 2 View 12/06/2011  *RADIOLOGY REPORT*  Clinical Data: Weakness.  CHEST - 2 VIEW  Comparison: Plain films of the chest 05/01/2007 and 06/30/2008.  Findings: Marked cardiomegaly is again seen. Upper lobe scar on the left is unchanged.  Lungs are otherwise clear.  No pneumothorax or pleural fluid.  IMPRESSION: Cardiomegaly without acute disease.   Original Report Authenticated By: Bernadene Bell. Maricela Curet, M.D.    Ct Head Wo Contrast 12/06/2011  *RADIOLOGY REPORT*  Clinical Data: Weakness.  CT HEAD WITHOUT CONTRAST  Technique:  Contiguous axial images were obtained from the base of the skull through the vertex without contrast.  Comparison: None.  Findings: There is cortical atrophy and chronic microvascular ischemic change.  No evidence of acute infarction, hemorrhage, mass lesion, mass effect, midline shift or abnormal extra-axial fluid collection.  No hydrocephalus or pneumocephalus.  Calvarium intact.  IMPRESSION: No acute finding.  Atrophy and chronic microvascular ischemic change.   Original Report Authenticated By: Bernadene Bell. D'ALESSIO, M.D.    EKG: afib 47bpm NSST changes  Physical Exam: Blood pressure 194/62, pulse 52, temperature 97.4 F (36.3 C), temperature source Oral, resp. rate 20, height 5\' 9"  (1.753 m), weight 115 lb  (52.164 kg), SpO2 96.00%. General: Frail thin elderly AAM in no acute distress. Head: Normocephalic, atraumatic, sclera non-icteric, no xanthomas, nares are without discharge. Poor dentition with many missing teeth. Neck: Negative for carotid bruits. JVD not elevated. Lungs: Clear bilaterally to auscultation without wheezes, rales, or rhonchi. Breathing is unlabored. Heart: Irregular but normal rate with S1 S2. +S4. No rubs or murmurs appreciated. Abdomen: Soft, non-tender, non-distended with normoactive bowel sounds. No hepatomegaly. No rebound/guarding. No obvious abdominal masses. Msk:  Strength and tone appear normal for age. Extremities: No clubbing or cyanosis. No edema. Thin. Distal pedal pulses are 2+ and equal bilaterally. Neuro: Alert and oriented X 3. No facial asymmetry. No focal deficit. Moves all extremities spontaneously. Very hard of hearing. Psych:  Responds to questions appropriately with a slow affect.   Assessment and Plan:   1. Nearsyncope/weakness 2. Afib with known sick sinus syndrome, intermittent NSWCT on tele 3. Chronic Coumadin use (hx SMA embolism, HTN, CHF, age) 4. Hypertensive urgency 5. H/o mild dementia 6. Rheumatic heart disease with hx of moderate mitral stenosis 7. Hypoglycemia this AM  Agree with rx of BP with addition of hydralazine with careful lowering to prevent relative cerebral hypoperfusion. Unclear if weakness was due to extremely high BP, hypoglycemia, neurologic event, possible pause with known SSS, or attributable to this WCT seen on tele (?NSVT versus afib with abberrancy). Will defer possible workup of hypoglycemia to IM. The issue of pacemaker may have to be revisited. If he is having near syncopal episodes related to arrhythmia, then Coumadin poses a problem if this is not resolved. I spoke to his son on the phone, Weylin Plagge, who states his dad has some mild dementia with confusion with short-term memory (i.e. forgetting doctor's  appointments) but in general is still able to be active, ambulate around the house and "enjoy life." He would be willing to consider pacemaker if that's what was deemed appropriate. His son's phone number is [phone] per chart.   I tentatively discussed the situation with Dr. Ladona Ridgel who would like to await Dr. Fabio Bering assessment. As above, it is unclear if his bradycardia had any hand in this weakness episode given that prior to this event, he has not had any problems with his chronic bradycardia. Will review tele and records with Dr. Eden Emms. See below for our comprehensive thoughts.  Difficult situation.  Event monitor would not be helpful.  There  has been no documentation of acute symptoms correlated with frank pauses.  Dont think pacer indicated at this point  Agree with concern about frailty and falls with coumadin Would have low threshold to stop anticoagulation.   Charlton Haws   Signed, Dayna Dunn PA-C 12/06/2011, 8:50 AM

## 2011-12-06 NOTE — ED Provider Notes (Signed)
History     CSN: [id number]  Arrival date & time 12/05/11  2345   First MD Initiated Contact with Patient 12/05/11 2350      Chief Complaint  Patient presents with  . Transient Ischemic Attack     The history is provided by a relative, a caregiver and the EMS personnel. History Limited By: Hx dementia.  Pt was seen at 0005.  Episode of AMS PTA. Pt was walking at home and then "just leaned over," did not respond to family.  No syncope. No loss of muscle tone, no fall, no apnea.  Symptoms resolved by EMS arrival to scene.  Pt states he is "ok" on arrival to ED.  Denies CP/SOB, no abd pain, no N/V/D.     Past Medical History  Diagnosis Date  . Rheumatic heart disease     moderate mitral stenosis and history of CHF  . Hypertension   . Sick sinus syndrome      Paroxysmal and then constant AF; slow rate on modest AV nodal blocking agents  . Prostate cancer     Carcinoma of the prostate  . Chronic low back pain   . Dementia     Past Surgical History  Procedure Date  . Transurethral resection of prostate     History  Substance Use Topics  . Smoking status: Never Smoker   . Smokeless tobacco: Never Used  . Alcohol Use: No      Review of Systems  Unable to perform ROS: Dementia     Allergies  Review of patient's allergies indicates no known allergies.  Home Medications   Current Outpatient Rx  Name Route Sig Dispense Refill  . HYDROCHLOROTHIAZIDE 25 MG PO TABS Oral Take 1 tablet (25 mg total) by mouth daily. 30 tablet 3  . POLYSACCHARIDE IRON COMPLEX 150 MG PO CAPS Oral Take 150 mg by mouth daily.    Marland Kitchen OLMESARTAN MEDOXOMIL 40 MG PO TABS Oral Take 1 tablet (40 mg total) by mouth daily. 30 tablet 6  . WARFARIN SODIUM 5 MG PO TABS Oral Take 1 tablet (5 mg total) by mouth as directed. As of 10/24/11 Take coumadin 1/2 tablet daily except 1 tablet on Tuesdays and Fridays 30 tablet 3  . ZOLPIDEM TARTRATE 5 MG PO TABS Oral Take 2.5 mg by mouth at bedtime as needed.    .  IRON PO Oral Take by mouth daily.    Marland Kitchen ZOLPIDEM TARTRATE 5 MG PO TABS Oral Take 0.5 tablets (2.5 mg total) by mouth at bedtime as needed for sleep. 15 tablet 1    BP 223/101  Pulse 54  Temp 97.4 F (36.3 C) (Oral)  Resp 20  Ht 5\' 9"  (1.753 m)  Wt 115 lb (52.164 kg)  BMI 16.98 kg/m2  SpO2 98%  Physical Exam 0010: Physical examination:  Nursing notes reviewed; Vital signs and O2 SAT reviewed;  Constitutional: Thin, frail, In no acute distress; Head:  Normocephalic, atraumatic; Eyes: EOMI, PERRL, No scleral icterus; ENMT: Mouth and pharynx normal, Mucous membranes dry; Neck: Supple, Full range of motion, No lymphadenopathy; Cardiovascular: Irregular irregular rate and rhythm, bradycardia, No gallop; Respiratory: Breath sounds clear & equal bilaterally, No wheezes.  Speaking full sentences with ease, Normal respiratory effort/excursion; Chest: Nontender, Movement normal; Abdomen: Soft, Nontender, Nondistended, Normal bowel sounds;; Extremities: Pulses normal, No tenderness, No edema, No calf edema or asymmetry.; Neuro: Awake, alert, confused re: time, place, events. Major CN grossly intact.  Strength 5/5 equal bilat UE's and LE's.  DTR  2/4 equal bilat UE's and LE's.  No gross sensory deficits.  Normal cerebellar testing bilat UE's (finger-nose) and LE's (heel-shin). Speech clear.  No facial droop.;; Skin: Color normal, Warm, Dry.   ED Course  Procedures   0245:  Neuro exam unchanged while in the ED.  BP slowly improving without intervention: now 190/90's.  In going through pt's medication bottles at the bedside, several bottles still full of meds when they should be empty.  Question anti-HTN meds compliance.  Hx bradycardia and SSS, continues afib with bradycardia in 40-50's while in the ED.  +UTI, UC pending.  Will start IV rocephin.  Dx testing d/w pt (family has apparently left).  Questions answered.  Verb understanding, agreeable to admit.  T/C to [facility]. Orvan Falconer, case discussed, including:   HPI, pertinent PM/SHx, VS/PE, dx testing, ED course and treatment:  Agreeable to observation admit, requests to obtain tele bed.   MDM  MDM Reviewed: previous chart, nursing note and vitals Reviewed previous: labs and ECG Interpretation: labs, ECG, x-ray and CT scan      Date: 12/06/2011  Rate: 47  Rhythm: atrial fibrillation  QRS Axis: normal  Intervals: normal  ST/T Wave abnormalities: nonspecific ST/T changes  Conduction Disutrbances:none  Narrative Interpretation:  LVH  Old EKG Reviewed: changes noted; NS STTW changes new since previous EKG dated 06/28/2008.  Results for orders placed during the hospital encounter of 12/05/11  CBC WITH DIFFERENTIAL      Component Value Range   WBC 3.9 (*) 4.0 - 10.5 K/uL   RBC 4.85  4.22 - 5.81 MIL/uL   Hemoglobin 13.8  13.0 - 17.0 g/dL   HCT 16.1  09.6 - 04.5 %   MCV 85.2  78.0 - 100.0 fL   MCH 28.5  26.0 - 34.0 pg   MCHC 33.4  30.0 - 36.0 g/dL   RDW 40.9  81.1 - 91.4 %   Platelets 178  150 - 400 K/uL   Neutrophils Relative 53  43 - 77 %   Neutro Abs 2.1  1.7 - 7.7 K/uL   Lymphocytes Relative 27  12 - 46 %   Lymphs Abs 1.0  0.7 - 4.0 K/uL   Monocytes Relative 14 (*) 3 - 12 %   Monocytes Absolute 0.5  0.1 - 1.0 K/uL   Eosinophils Relative 5  0 - 5 %   Eosinophils Absolute 0.2  0.0 - 0.7 K/uL   Basophils Relative 1  0 - 1 %   Basophils Absolute 0.0  0.0 - 0.1 K/uL  BASIC METABOLIC PANEL      Component Value Range   Sodium 136  135 - 145 mEq/L   Potassium 4.4  3.5 - 5.1 mEq/L   Chloride 101  96 - 112 mEq/L   CO2 27  19 - 32 mEq/L   Glucose, Bld 87  70 - 99 mg/dL   BUN 22  6 - 23 mg/dL   Creatinine, Ser 7.82  0.50 - 1.35 mg/dL   Calcium 9.6  8.4 - 95.6 mg/dL   GFR calc non Af Amer 74 (*) >90 mL/min   GFR calc Af Amer 86 (*) >90 mL/min  PROTIME-INR      Component Value Range   Prothrombin Time 23.1 (*) 11.6 - 15.2 seconds   INR 2.15 (*) 0.00 - 1.49  APTT      Component Value Range   aPTT 42 (*) 24 - 37 seconds  URINE RAPID  DRUG SCREEN (HOSP PERFORMED)  Component Value Range   Opiates NONE DETECTED  NONE DETECTED   Cocaine NONE DETECTED  NONE DETECTED   Benzodiazepines NONE DETECTED  NONE DETECTED   Amphetamines NONE DETECTED  NONE DETECTED   Tetrahydrocannabinol NONE DETECTED  NONE DETECTED   Barbiturates NONE DETECTED  NONE DETECTED  TROPONIN I      Component Value Range   Troponin I <0.30  <0.30 ng/mL  URINALYSIS, ROUTINE W REFLEX MICROSCOPIC      Component Value Range   Color, Urine YELLOW  YELLOW   APPearance HAZY (*) CLEAR   Specific Gravity, Urine 1.020  1.005 - 1.030   pH 7.5  5.0 - 8.0   Glucose, UA NEGATIVE  NEGATIVE mg/dL   Hgb urine dipstick SMALL (*) NEGATIVE   Bilirubin Urine NEGATIVE  NEGATIVE   Ketones, ur NEGATIVE  NEGATIVE mg/dL   Protein, ur 30 (*) NEGATIVE mg/dL   Urobilinogen, UA 0.2  0.0 - 1.0 mg/dL   Nitrite NEGATIVE  NEGATIVE   Leukocytes, UA NEGATIVE  NEGATIVE  GLUCOSE, CAPILLARY      Component Value Range   Glucose-Capillary 69 (*) 70 - 99 mg/dL  URINE MICROSCOPIC-ADD ON      Component Value Range   WBC, UA 11-20  <3 WBC/hpf   RBC / HPF 0-2  <3 RBC/hpf   Bacteria, UA MANY (*) RARE   Crystals TRIPLE PHOSPHATE CRYSTALS (*) NEGATIVE   Dg Chest 2 View 12/06/2011  *RADIOLOGY REPORT*  Clinical Data: Weakness.  CHEST - 2 VIEW  Comparison: Plain films of the chest 05/01/2007 and 06/30/2008.  Findings: Marked cardiomegaly is again seen. Upper lobe scar on the left is unchanged.  Lungs are otherwise clear.  No pneumothorax or pleural fluid.  IMPRESSION: Cardiomegaly without acute disease.   Original Report Authenticated By: Bernadene Bell. Maricela Curet, M.D.    Ct Head Wo Contrast 12/06/2011  *RADIOLOGY REPORT*  Clinical Data: Weakness.  CT HEAD WITHOUT CONTRAST  Technique:  Contiguous axial images were obtained from the base of the skull through the vertex without contrast.  Comparison: None.  Findings: There is cortical atrophy and chronic microvascular ischemic change.  No evidence of  acute infarction, hemorrhage, mass lesion, mass effect, midline shift or abnormal extra-axial fluid collection.  No hydrocephalus or pneumocephalus.  Calvarium intact.  IMPRESSION: No acute finding.  Atrophy and chronic microvascular ischemic change.   Original Report Authenticated By: Bernadene Bell. Maricela Curet, M.D.              Laray Anger, DO 12/07/11 1900

## 2011-12-06 NOTE — Progress Notes (Addendum)
This gentleman was admitted earlier today for pre-syncope, bradycardia, sick sinus syndrome and hypertension.  Patient seen and examined, database reviewed  Unclear as to the exact cause of his symptoms. He is currently on treatment for possible urinary tract infection. Hydralazine has been added to his regimen to help control his blood pressure. He is being monitored on telemetry and was seen by the cardiology service. At this time, it was not felt that a pacemaker was indicated. Patient is on anticoagulation for atrial fibrillation. As his condition improves he will be ambulated and we will need to assess his gait and safety for anticoagulation. Continue current treatments.

## 2011-12-07 DIAGNOSIS — F0390 Unspecified dementia without behavioral disturbance: Secondary | ICD-10-CM

## 2011-12-07 LAB — CBC
HCT: 42.9 % (ref 39.0–52.0)
MCH: 27.5 pg (ref 26.0–34.0)
MCHC: 33.3 g/dL (ref 30.0–36.0)
MCV: 82.5 fL (ref 78.0–100.0)
RDW: 14.1 % (ref 11.5–15.5)
WBC: 5.1 10*3/uL (ref 4.0–10.5)

## 2011-12-07 LAB — BASIC METABOLIC PANEL
BUN: 16 mg/dL (ref 6–23)
CO2: 23 mEq/L (ref 19–32)
Chloride: 102 mEq/L (ref 96–112)
Creatinine, Ser: 0.88 mg/dL (ref 0.50–1.35)
Glucose, Bld: 87 mg/dL (ref 70–99)

## 2011-12-07 LAB — PROTIME-INR: Prothrombin Time: 20 seconds — ABNORMAL HIGH (ref 11.6–15.2)

## 2011-12-07 MED ORDER — AMLODIPINE BESYLATE 5 MG PO TABS
5.0000 mg | ORAL_TABLET | Freq: Every day | ORAL | Status: DC
  Administered 2011-12-07: 5 mg via ORAL
  Filled 2011-12-07: qty 1

## 2011-12-07 MED ORDER — POLYETHYLENE GLYCOL 3350 17 G PO PACK
17.0000 g | PACK | Freq: Every day | ORAL | Status: DC
  Administered 2011-12-07 – 2011-12-09 (×3): 17 g via ORAL
  Filled 2011-12-07 (×3): qty 1

## 2011-12-07 MED ORDER — WARFARIN - PHARMACIST DOSING INPATIENT: Freq: Every day | Status: DC

## 2011-12-07 MED ORDER — WARFARIN SODIUM 2.5 MG PO TABS
2.5000 mg | ORAL_TABLET | Freq: Once | ORAL | Status: AC
  Administered 2011-12-07: 2.5 mg via ORAL
  Filled 2011-12-07: qty 1

## 2011-12-07 NOTE — Progress Notes (Signed)
[facility] PROGRESS NOTE  [name] RUE:[id number] DOB: 1924/05/28 DOA: 12/05/2011 PCP: Kirk Ruths, MD  Assessment/Plan: Principal Problem:  *Syncope Active Problems:  SICK SINUS SYNDROME  Atrial fibrillation  Hypertension  Symptomatic bradycardia  Dementia  Incontinence of urine  1. Syncope. Unclear etiology. He did have some transient hypoglycemia which was likely secondary to decreased by mouth intake, but seems to have resolved. He does not appear to have any further symptoms. Cardiology is following the patient since he does have a history of sinus arrhythmia and was found to have pauses on prior monitoring. Patient continues to be bradycardic. It was felt that a pacer is indicated at this time. 2. Possible UTI. Urine cultures have shown no growth. We will discontinue antibiotics 3. Hypertension. Remains uncontrolled. We'll make further adjustments to medications. 4. Atrial fibrillation. Patient is on Coumadin for anticoagulation. His gait is somewhat unsteady and there is history of falls. We will request a physical therapy evaluation to assess his gait and stability and his fall risk. If patient is too unstable, then we would need to discontinue Coumadin. 5. Dementia, mild  Code Status: full code Family Communication: discussed with son over the phone Disposition Plan: if continues to improve, possible discharge home over next 24-48hrs   Brief narrative: This gentleman was admitted to the hospital with pre-syncope type symptoms.  He was noted to be hypertensive, bradycardic and somewhat dehydrated.    Consultants:  [facility]  Procedures:  none  Antibiotics:  Rocephin 9/20-9/21  HPI/Subjective: Feeling better, offers no complaints this morning  Objective: Filed Vitals:   12/06/11 1157 12/06/11 1600 12/06/11 2357 12/07/11 0613  BP: 142/67 175/77 155/74 198/86  Pulse: 60 50 61 45  Temp: 97.6 F (36.4 C) 98.4 F (36.9 C) 98.1 F (36.7  C) 97.5 F (36.4 C)  TempSrc: Oral Oral Oral Oral  Resp: 22 22 20 20   Height:      Weight:    50.7 kg (111 lb 12.4 oz)  SpO2: 96% 96% 99% 100%    Intake/Output Summary (Last 24 hours) at 12/07/11 0900 Last data filed at 12/07/11 1478  Gross per 24 hour  Intake   2376 ml  Output   1050 ml  Net   1326 ml   Filed Weights   12/05/11 2348 12/07/11 0613  Weight: 52.164 kg (115 lb) 50.7 kg (111 lb 12.4 oz)    Exam:   General:  NAD  Cardiovascular: s1, s2, rrr  Respiratory: cta b  Abdomen: soft, nt, bs+  Data Reviewed: Basic Metabolic Panel:  Lab 12/07/11 2956 12/06/11 0047  NA 133* 136  K 4.3 4.4  CL 102 101  CO2 23 27  GLUCOSE 87 87  BUN 16 22  CREATININE 0.88 0.96  CALCIUM 8.9 9.6  MG -- 2.1  PHOS -- --   Liver Function Tests:  Lab 12/06/11 0047  AST 28  ALT 13  ALKPHOS 86  BILITOT 0.4  PROT 7.7  ALBUMIN 3.6   No results found for this basename: LIPASE:5,AMYLASE:5 in the last 168 hours No results found for this basename: AMMONIA:5 in the last 168 hours CBC:  Lab 12/07/11 0513 12/06/11 0047  WBC 5.1 3.9*  NEUTROABS -- 2.1  HGB 14.3 13.8  HCT 42.9 41.3  MCV 82.5 85.2  PLT 152 178   Cardiac Enzymes:  Lab 12/06/11 0047  CKTOTAL --  CKMB --  CKMBINDEX --  TROPONINI <0.30   BNP (last 3 results) No results found for this basename:  PROBNP:3 in the last 8760 hours CBG:  Lab 12/06/11 0052  GLUCAP 69*    Recent Results (from the past 240 hour(s))  URINE CULTURE     Status: Normal   Collection Time   12/06/11 12:49 AM      Component Value Range Status Comment   Specimen Description URINE, CLEAN CATCH   Final    Special Requests NONE   Final    Culture  Setup Time 12/06/2011 02:35   Final    Colony Count NO GROWTH   Final    Culture NO GROWTH   Final    Report Status 12/06/2011 FINAL   Final      Studies: Dg Chest 2 View  12/06/2011  *RADIOLOGY REPORT*  Clinical Data: Weakness.  CHEST - 2 VIEW  Comparison: Plain films of the chest  05/01/2007 and 06/30/2008.  Findings: Marked cardiomegaly is again seen. Upper lobe scar on the left is unchanged.  Lungs are otherwise clear.  No pneumothorax or pleural fluid.  IMPRESSION: Cardiomegaly without acute disease.   Original Report Authenticated By: Bernadene Bell. Maricela Curet, M.D.    Ct Head Wo Contrast  12/06/2011  *RADIOLOGY REPORT*  Clinical Data: Weakness.  CT HEAD WITHOUT CONTRAST  Technique:  Contiguous axial images were obtained from the base of the skull through the vertex without contrast.  Comparison: None.  Findings: There is cortical atrophy and chronic microvascular ischemic change.  No evidence of acute infarction, hemorrhage, mass lesion, mass effect, midline shift or abnormal extra-axial fluid collection.  No hydrocephalus or pneumocephalus.  Calvarium intact.  IMPRESSION: No acute finding.  Atrophy and chronic microvascular ischemic change.   Original Report Authenticated By: Bernadene Bell. D'ALESSIO, M.D.     Scheduled Meds:   . hydrALAZINE  50 mg Oral Q8H  . irbesartan  300 mg Oral Daily  . sodium chloride  3 mL Intravenous Q12H  . DISCONTD: cefTRIAXone (ROCEPHIN)  IV  1 g Intravenous QHS   Continuous Infusions:   . 0.9 % NaCl with KCl 20 mEq / L 75 mL/hr at 12/06/11 1946    Principal Problem:  *Syncope Active Problems:  SICK SINUS SYNDROME  Atrial fibrillation  Hypertension  Symptomatic bradycardia  Dementia  Incontinence of urine    Time spent: 25 mins    [name]  [facility] Pager [phone]. If 7PM-7AM, please contact night-coverage at [email or web address], password [facility] 12/07/2011, 9:00 AM  LOS: 2 days

## 2011-12-07 NOTE — Progress Notes (Signed)
ANTICOAGULATION CONSULT NOTE  Pharmacy Consult for Warfarin Indication: atrial fibrillation  No Known Allergies  Patient Measurements: Height: 5\' 9"  (175.3 cm) Weight: 111 lb 12.4 oz (50.7 kg) IBW/kg (Calculated) : 70.7   Vital Signs: Temp: 97.5 F (36.4 C) (09/21 0613) Temp src: Oral (09/21 0613) BP: 198/86 mmHg (09/21 0613) Pulse Rate: 45  (09/21 0613)  Labs:  Basename 12/07/11 0513 12/06/11 0047  HGB 14.3 13.8  HCT 42.9 41.3  PLT 152 178  APTT -- 42*  LABPROT -- 23.1*  INR -- 2.15*  HEPARINUNFRC -- --  CREATININE 0.88 0.96  CKTOTAL -- --  CKMB -- --  TROPONINI -- <0.30   Estimated Creatinine Clearance: 44.8 ml/min (by C-G formula based on Cr of 0.88).  Medical History: Past Medical History  Diagnosis Date  . Rheumatic heart disease     moderate mitral stenosis and history of CHF  . Hypertension   . Sick sinus syndrome      Paroxysmal and then constant AF; slow rate on modest AV nodal blocking agents  . Prostate cancer     Carcinoma of the prostate  . Chronic low back pain   . Dementia   . Atrial fibrillation     Per record has Afib/Aflutter with SSS  . PVD (peripheral vascular disease)     Right superficial femoral to peroneal artery bypass graft with nonreversed saphenous vein composite  2002  . Mesenteric ischemia     SMA artery embolectomy in 2002   Medications:  Prescriptions prior to admission  Medication Sig Dispense Refill  . hydrochlorothiazide (HYDRODIURIL) 25 MG tablet Take 1 tablet (25 mg total) by mouth daily.  30 tablet  3  . iron polysaccharides (NIFEREX) 150 MG capsule Take 150 mg by mouth daily.      Marland Kitchen olmesartan (BENICAR) 40 MG tablet Take 1 tablet (40 mg total) by mouth daily.  30 tablet  6  . warfarin (COUMADIN) 5 MG tablet Take 2.5-5 mg by mouth daily. Take coumadin 1/2 tablet daily except 1 tablet on Tuesdays and Fridays.      Marland Kitchen zolpidem (AMBIEN) 5 MG tablet Take 2.5 mg by mouth at bedtime as needed.        Assessment: Okay for  Protocol, His gait is somewhat unsteady and there is history of falls.  PT evaluation pending.  Goal of Therapy:  INR 2-3   Plan:  Warfarin 2.5 mg PO x 1. Daily PT/INR. Monitor for Signs and Symptoms of Bleeding. Educate if/when pertinent.   Mady Gemma 12/07/2011,9:44 AM

## 2011-12-08 LAB — PROTIME-INR: Prothrombin Time: 20.1 seconds — ABNORMAL HIGH (ref 11.6–15.2)

## 2011-12-08 MED ORDER — AMLODIPINE BESYLATE 5 MG PO TABS
10.0000 mg | ORAL_TABLET | Freq: Every day | ORAL | Status: DC
  Administered 2011-12-08 – 2011-12-09 (×2): 10 mg via ORAL
  Filled 2011-12-08 (×2): qty 2

## 2011-12-08 MED ORDER — SODIUM CHLORIDE 0.9 % IJ SOLN
INTRAMUSCULAR | Status: AC
  Administered 2011-12-08: 10 mL
  Filled 2011-12-08: qty 3

## 2011-12-08 MED ORDER — HYDRALAZINE HCL 25 MG PO TABS
75.0000 mg | ORAL_TABLET | Freq: Three times a day (TID) | ORAL | Status: DC
  Administered 2011-12-08 – 2011-12-09 (×3): 75 mg via ORAL
  Filled 2011-12-08 (×3): qty 3

## 2011-12-08 MED ORDER — WARFARIN SODIUM 2 MG PO TABS
4.0000 mg | ORAL_TABLET | Freq: Once | ORAL | Status: AC
  Administered 2011-12-08: 4 mg via ORAL
  Filled 2011-12-08: qty 2

## 2011-12-08 NOTE — Progress Notes (Signed)
[facility] PROGRESS NOTE  [name] ZOX:[id number] DOB: Sep 23, 1924 DOA: 12/05/2011 PCP: Kirk Ruths, MD  Assessment/Plan: Principal Problem:  *Syncope Active Problems:  SICK SINUS SYNDROME  Atrial fibrillation  Hypertension  Symptomatic bradycardia  Dementia  Incontinence of urine  1. Syncope. Unclear etiology. He did have some transient hypoglycemia which was likely secondary to decreased by mouth intake, but seems to have resolved. He does not appear to have any further symptoms. Cardiology is following the patient since he does have a history of sinus arrhythmia and was found to have pauses on prior monitoring. Patient continues to be bradycardic. It was not felt that a pacer is indicated at this time. 2. Hypertension. His blood pressure remains uncontrolled. He is already on Avapro, hydralazine, Norvasc. Control his blood pressure may be challenging with his bradycardia. Will increase his Norvasc as well as hydralazine. We will await cardiology to reevaluate tomorrow. Pacemaker placement may also aid in blood pressure management, since bradycardia would no longer be an issue. 3. Atrial fibrillation. Patient is on Coumadin for anticoagulation. There was some concern regarding the patient's gait stability. This was discussed with his son and he does not report a history of unsteady gait frequent falls. Will ask physical therapy to assess tomorrow. Will continue Coumadin for now. 4. Dementia, mild  Code Status: full code Family Communication: discussed with son over the phone Disposition Plan: if continues to improve, possible discharge home over next 24-48hrs   Brief narrative: This gentleman was admitted to the hospital with pre-syncope type symptoms.  He was noted to be hypertensive, bradycardic and somewhat dehydrated.    Consultants:  [facility]  Procedures:  none  Antibiotics:  Rocephin 9/20-9/21  HPI/Subjective: Feeling tired today, did not  sleep well last night. Does not have any chest pain or shortness of breath. Denies any dizziness or lightheadedness  Objective: Filed Vitals:   12/07/11 2152 12/08/11 0157 12/08/11 0644 12/08/11 0741  BP: 163/82 165/93 199/119 180/72  Pulse: 84 64 57   Temp: 98.5 F (36.9 C) 97.5 F (36.4 C) 98 F (36.7 C)   TempSrc: Oral Oral Oral   Resp: 20 20 19    Height:      Weight:   50.6 kg (111 lb 8.8 oz)   SpO2: 100% 100% 100%     Intake/Output Summary (Last 24 hours) at 12/08/11 0918 Last data filed at 12/08/11 0600  Gross per 24 hour  Intake    363 ml  Output    675 ml  Net   -312 ml   Filed Weights   12/05/11 2348 12/07/11 0613 12/08/11 0644  Weight: 52.164 kg (115 lb) 50.7 kg (111 lb 12.4 oz) 50.6 kg (111 lb 8.8 oz)    Exam:   General:  NAD  Cardiovascular: s1, s2, rrr  Respiratory: cta b  Abdomen: soft, nt, bs+  Data Reviewed: Basic Metabolic Panel:  Lab 12/07/11 8119 12/06/11 0047  NA 133* 136  K 4.3 4.4  CL 102 101  CO2 23 27  GLUCOSE 87 87  BUN 16 22  CREATININE 0.88 0.96  CALCIUM 8.9 9.6  MG -- 2.1  PHOS -- --   Liver Function Tests:  Lab 12/06/11 0047  AST 28  ALT 13  ALKPHOS 86  BILITOT 0.4  PROT 7.7  ALBUMIN 3.6   No results found for this basename: LIPASE:5,AMYLASE:5 in the last 168 hours No results found for this basename: AMMONIA:5 in the last 168 hours CBC:  Lab 12/07/11 0513 12/06/11  0047  WBC 5.1 3.9*  NEUTROABS -- 2.1  HGB 14.3 13.8  HCT 42.9 41.3  MCV 82.5 85.2  PLT 152 178   Cardiac Enzymes:  Lab 12/06/11 0047  CKTOTAL --  CKMB --  CKMBINDEX --  TROPONINI <0.30   BNP (last 3 results) No results found for this basename: PROBNP:3 in the last 8760 hours CBG:  Lab 12/06/11 0052  GLUCAP 69*    Recent Results (from the past 240 hour(s))  URINE CULTURE     Status: Normal   Collection Time   12/06/11 12:49 AM      Component Value Range Status Comment   Specimen Description URINE, CLEAN CATCH   Final    Special  Requests NONE   Final    Culture  Setup Time 12/06/2011 02:35   Final    Colony Count NO GROWTH   Final    Culture NO GROWTH   Final    Report Status 12/06/2011 FINAL   Final      Studies: No results found.  Scheduled Meds:    . amLODipine  5 mg Oral Daily  . hydrALAZINE  50 mg Oral Q8H  . irbesartan  300 mg Oral Daily  . polyethylene glycol  17 g Oral Daily  . sodium chloride  3 mL Intravenous Q12H  . warfarin  2.5 mg Oral ONCE-1800  . warfarin  4 mg Oral ONCE-1800  . Warfarin - Pharmacist Dosing Inpatient   Does not apply q1800   Continuous Infusions:    . DISCONTD: 0.9 % NaCl with KCl 20 mEq / L 75 mL/hr at 12/06/11 1946    Principal Problem:  *Syncope Active Problems:  SICK SINUS SYNDROME  Atrial fibrillation  Hypertension  Symptomatic bradycardia  Dementia  Incontinence of urine    Time spent: 25 mins    [name]  [facility] Pager [phone]. If 7PM-7AM, please contact night-coverage at [email or web address], password [facility] 12/08/2011, 9:18 AM  LOS: 3 days

## 2011-12-08 NOTE — Progress Notes (Signed)
ANTICOAGULATION CONSULT NOTE  Pharmacy Consult for Warfarin Indication: atrial fibrillation  No Known Allergies  Patient Measurements: Height: 5\' 9"  (175.3 cm) Weight: 111 lb 8.8 oz (50.6 kg) IBW/kg (Calculated) : 70.7   Vital Signs: Temp: 98 F (36.7 C) (09/22 0644) Temp src: Oral (09/22 0644) BP: 180/72 mmHg (09/22 0741) Pulse Rate: 57  (09/22 0644)  Labs:  Basename 12/08/11 0507 12/07/11 1016 12/07/11 0513 12/06/11 0047  HGB -- -- 14.3 13.8  HCT -- -- 42.9 41.3  PLT -- -- 152 178  APTT -- -- -- 42*  LABPROT 20.1* 20.0* -- 23.1*  INR 1.78* 1.77* -- 2.15*  HEPARINUNFRC -- -- -- --  CREATININE -- -- 0.88 0.96  CKTOTAL -- -- -- --  CKMB -- -- -- --  TROPONINI -- -- -- <0.30   Estimated Creatinine Clearance: 44.7 ml/min (by C-G formula based on Cr of 0.88).  Medications:  Prescriptions prior to admission  Medication Sig Dispense Refill  . hydrochlorothiazide (HYDRODIURIL) 25 MG tablet Take 1 tablet (25 mg total) by mouth daily.  30 tablet  3  . iron polysaccharides (NIFEREX) 150 MG capsule Take 150 mg by mouth daily.      Marland Kitchen olmesartan (BENICAR) 40 MG tablet Take 1 tablet (40 mg total) by mouth daily.  30 tablet  6  . warfarin (COUMADIN) 5 MG tablet Take 2.5-5 mg by mouth daily. Take coumadin 1/2 tablet daily except 1 tablet on Tuesdays and Fridays.      Marland Kitchen zolpidem (AMBIEN) 5 MG tablet Take 2.5 mg by mouth at bedtime as needed.       Assessment: Okay for Protocol, His gait is somewhat unsteady and there is history of falls.  PT evaluation pending.  Goal of Therapy:  INR 2-3   Plan:  Warfarin 4 mg PO x 1. Daily PT/INR. Monitor for Signs and Symptoms of Bleeding. Educate if/when pertinent.   Lamonte Richer R 12/08/2011,8:39 AM

## 2011-12-09 DIAGNOSIS — R4182 Altered mental status, unspecified: Secondary | ICD-10-CM

## 2011-12-09 LAB — CBC
Hemoglobin: 14.4 g/dL (ref 13.0–17.0)
MCH: 28 pg (ref 26.0–34.0)
MCHC: 33.2 g/dL (ref 30.0–36.0)
MCV: 84.4 fL (ref 78.0–100.0)
RBC: 5.14 MIL/uL (ref 4.22–5.81)

## 2011-12-09 LAB — BASIC METABOLIC PANEL
BUN: 24 mg/dL — ABNORMAL HIGH (ref 6–23)
CO2: 24 mEq/L (ref 19–32)
Calcium: 9.2 mg/dL (ref 8.4–10.5)
Glucose, Bld: 91 mg/dL (ref 70–99)
Potassium: 4.2 mEq/L (ref 3.5–5.1)
Sodium: 133 mEq/L — ABNORMAL LOW (ref 135–145)

## 2011-12-09 MED ORDER — HYDRALAZINE HCL 50 MG PO TABS: 75.0000 mg | ORAL_TABLET | Freq: Three times a day (TID) | ORAL | Status: DC

## 2011-12-09 MED ORDER — AMLODIPINE BESYLATE 10 MG PO TABS: 10.0000 mg | ORAL_TABLET | Freq: Every day | ORAL | Status: DC

## 2011-12-09 NOTE — Discharge Summary (Signed)
Physician Discharge Summary  [name] YNW:[id number] DOB: January 17, 1925 DOA: 12/05/2011  PCP: Kirk Ruths, MD  Admit date: 12/05/2011 Discharge date: 12/09/2011  Recommendations for Outpatient Follow-up:  1. Followup with cardiology, Dr. Dietrich Pates within a week or so. He needs evaluation of his hypertension and bradycardia.  Discharge Diagnoses:  1. Bradycardia with sick sinus syndrome, improved. 2. Chronic atrial fibrillation on chronic anticoagulation. 3. Hypertension, better control. 4. Mild dementia.   Discharge Condition: Stable and improved.  Diet recommendation: Regular.  Filed Weights   12/07/11 0613 12/08/11 0644 12/09/11 0446  Weight: 50.7 kg (111 lb 12.4 oz) 50.6 kg (111 lb 8.8 oz) 52 kg (114 lb 10.2 oz)    History of present illness:  This 78 year old man presented to the hospital with symptoms of episode of dizziness and confusion. Please see initial history as outlined below: HPI:  [name] is an 78 y.o. male. Small framed elderly African American gentleman, with mild dementia, chronic atrial fibrillation on Coumadin, known sick sinus syndrome chronic bradycardia, uncontrolled hypertension, was brought in by his son because he had an episode of sudden weakness, near loss of consciousness and was leaning up against a wall looking dazed and confused. Episode resolved fairly quickly and patient did not fall to the ground. No focal lateralizing weakness was noted.  The patient's severe bradycardia typically in the 40s, he did have our outpatient continuous cardiac monitoring a few months ago and was noted to have episodes of venous pauses greater than 3 seconds. He is a fairly active gentleman out in his yard without any mechanical assistance for ambulation, and a pacemaker placement was offered. [name] is some declined because he had noted no symptoms of this bradycardia.  Patient's blood pressure is currently treated with Benicar and HCTZ; amlodipine had been  switched because it has mild AV blocking functions. Patient's son reports that he has urinary incontinence related to the use of HCTZ.  Patient's blood pressure remains elevated on his current medical regimen  There is no history of chest pains or shortness of breath no headaches no fever cough or cold.  Hospital Course:  The patient was admitted to telemetry floor and closely monitored. The main issue seems to be bradycardia in the low 40s and also hypertension which was challenging to control. He was seen by cardiology, who felt that he was not a candidate for pacemaker at this moment although this may need to be reevaluated. His medications were adjusted for his hypertension and in the last 24 hours or so, his blood pressure is now stabilized and his bradycardia has improved. He feels well and is keen to go home.  Procedures:  None.  Consultations:  Cardiology, Dr. Eden Emms  Discharge Exam: Filed Vitals:   12/09/11 0440 12/09/11 0442 12/09/11 0444 12/09/11 0446  BP: 160/80 133/81 136/80   Pulse: 64 74 69   Temp: 97.7 F (36.5 C)     TempSrc: Oral     Resp: 18     Height:      Weight:    52 kg (114 lb 10.2 oz)  SpO2: 100% 100% 100%     General: He looks systemically well. Cardiovascular: Heart sounds are present and irregularly irregular consistent with atrial fibrillation. However, ventricular rate is more reasonable now in the 60s and 70s. Respiratory: He is is alert and orientated in time place and person. There are no obvious focal neurological signs.  Discharge Instructions  Discharge Orders    Future Appointments: Provider: Department: Dept  Phone: Center:   12/19/2011 3:50 PM [facility] [phone] [id number]   01/13/2012 11:30 AM Kathlen Brunswick, MD [facility] [phone] [id number]     Future Orders Please Complete By Expires   Diet - low sodium heart healthy      Increase activity slowly          Medication  List     As of 12/09/2011  8:18 AM    STOP taking these medications         hydrochlorothiazide 25 MG tablet   Commonly known as: HYDRODIURIL      TAKE these medications         amLODipine 10 MG tablet   Commonly known as: NORVASC   Take 1 tablet (10 mg total) by mouth daily.      hydrALAZINE 50 MG tablet   Commonly known as: APRESOLINE   Take 1.5 tablets (75 mg total) by mouth 3 (three) times daily.      iron polysaccharides 150 MG capsule   Commonly known as: NIFEREX   Take 150 mg by mouth daily.      olmesartan 40 MG tablet   Commonly known as: BENICAR   Take 1 tablet (40 mg total) by mouth daily.      warfarin 5 MG tablet   Commonly known as: COUMADIN   Take 2.5-5 mg by mouth daily. Take coumadin 1/2 tablet daily except 1 tablet on Tuesdays and Fridays.      zolpidem 5 MG tablet   Commonly known as: AMBIEN   Take 2.5 mg by mouth at bedtime as needed.          The results of significant diagnostics from this hospitalization (including imaging, microbiology, ancillary and laboratory) are listed below for reference.    Significant Diagnostic Studies: Dg Chest 2 View  12/06/2011  *RADIOLOGY REPORT*  Clinical Data: Weakness.  CHEST - 2 VIEW  Comparison: Plain films of the chest 05/01/2007 and 06/30/2008.  Findings: Marked cardiomegaly is again seen. Upper lobe scar on the left is unchanged.  Lungs are otherwise clear.  No pneumothorax or pleural fluid.  IMPRESSION: Cardiomegaly without acute disease.   Original Report Authenticated By: Bernadene Bell. Maricela Curet, M.D.    Ct Head Wo Contrast  12/06/2011  *RADIOLOGY REPORT*  Clinical Data: Weakness.  CT HEAD WITHOUT CONTRAST  Technique:  Contiguous axial images were obtained from the base of the skull through the vertex without contrast.  Comparison: None.  Findings: There is cortical atrophy and chronic microvascular ischemic change.  No evidence of acute infarction, hemorrhage, mass lesion, mass effect, midline shift or  abnormal extra-axial fluid collection.  No hydrocephalus or pneumocephalus.  Calvarium intact.  IMPRESSION: No acute finding.  Atrophy and chronic microvascular ischemic change.   Original Report Authenticated By: Bernadene Bell. Maricela Curet, M.D.     Microbiology: Recent Results (from the past 240 hour(s))  URINE CULTURE     Status: Normal   Collection Time   12/06/11 12:49 AM      Component Value Range Status Comment   Specimen Description URINE, CLEAN CATCH   Final    Special Requests NONE   Final    Culture  Setup Time 12/06/2011 02:35   Final    Colony Count NO GROWTH   Final    Culture NO GROWTH   Final    Report Status 12/06/2011 FINAL   Final      Labs: Basic Metabolic Panel:  Lab 12/09/11 8469 12/07/11 0513 12/06/11 0047  NA  133* 133* 136  K 4.2 4.3 4.4  CL 100 102 101  CO2 24 23 27   GLUCOSE 91 87 87  BUN 24* 16 22  CREATININE 1.06 0.88 0.96  CALCIUM 9.2 8.9 9.6  MG -- -- 2.1  PHOS -- -- --   Liver Function Tests:  Lab 12/06/11 0047  AST 28  ALT 13  ALKPHOS 86  BILITOT 0.4  PROT 7.7  ALBUMIN 3.6     CBC:  Lab 12/09/11 0453 12/07/11 0513 12/06/11 0047  WBC 4.9 5.1 3.9*  NEUTROABS -- -- 2.1  HGB 14.4 14.3 13.8  HCT 43.4 42.9 41.3  MCV 84.4 82.5 85.2  PLT 150 152 178   Cardiac Enzymes:  Lab 12/06/11 0047  CKTOTAL --  CKMB --  CKMBINDEX --  TROPONINI <0.30    CBG:  Lab 12/06/11 0052  GLUCAP 69*    Time coordinating discharge: *Greater than 30 minutes  Signed:  Vallen Calabrese C  [facility] 12/09/2011, 8:18 AM

## 2011-12-09 NOTE — Care Management Note (Signed)
    Page 1 of 1   12/09/2011     2:28:18 PM   CARE MANAGEMENT NOTE 12/09/2011  Patient:  [name], [name]   Account Number:  [id number]  Date Initiated:  12/09/2011  Documentation initiated by:  Rosemary Holms  Subjective/Objective Assessment:   Pt admitted from home where he is cared for by his neice. Declined HH.     Action/Plan:   DC   Anticipated DC Date:  12/09/2011   Anticipated DC Plan:  HOME/SELF CARE      DC Planning Services  CM consult      Choice offered to / List presented to:             Status of service:  Completed, signed off Medicare Important Message given?  YES (If response is "NO", the following Medicare IM given date fields will be blank) Date Medicare IM given:  12/09/2011 Date Additional Medicare IM given:    Discharge Disposition:  HOME/SELF CARE  Per UR Regulation:    If discussed at Long Length of Stay Meetings, dates discussed:    Comments:  12/09/11 Rosemary Holms RN BSN CM

## 2011-12-09 NOTE — Progress Notes (Signed)
Discharge instructions given to pts son and to the patient.  Both verbalized understanding.  Pt left the floor via w/c with staff in stable condition.  There no further complaints or concerns at this time.

## 2011-12-13 ENCOUNTER — Emergency Department (HOSPITAL_COMMUNITY): Payer: Medicare Other

## 2011-12-13 ENCOUNTER — Inpatient Hospital Stay (HOSPITAL_COMMUNITY)
Admission: EM | Admit: 2011-12-13 | Discharge: 2011-12-20 | DRG: 244 | Disposition: A | Discharge status: Home / Self Care | Payer: Medicare Other | Attending: Cardiology | Admitting: Cardiology

## 2011-12-13 ENCOUNTER — Encounter (HOSPITAL_COMMUNITY): Payer: Self-pay

## 2011-12-13 DIAGNOSIS — F0390 Unspecified dementia without behavioral disturbance: Secondary | ICD-10-CM | POA: Diagnosis present

## 2011-12-13 DIAGNOSIS — R55 Syncope and collapse: Secondary | ICD-10-CM

## 2011-12-13 DIAGNOSIS — Z79899 Other long term (current) drug therapy: Secondary | ICD-10-CM

## 2011-12-13 DIAGNOSIS — I739 Peripheral vascular disease, unspecified: Secondary | ICD-10-CM | POA: Diagnosis present

## 2011-12-13 DIAGNOSIS — I10 Essential (primary) hypertension: Secondary | ICD-10-CM | POA: Diagnosis present

## 2011-12-13 DIAGNOSIS — I119 Hypertensive heart disease without heart failure: Secondary | ICD-10-CM | POA: Diagnosis present

## 2011-12-13 DIAGNOSIS — I080 Rheumatic disorders of both mitral and aortic valves: Secondary | ICD-10-CM | POA: Diagnosis present

## 2011-12-13 DIAGNOSIS — Z86718 Personal history of other venous thrombosis and embolism: Secondary | ICD-10-CM

## 2011-12-13 DIAGNOSIS — I498 Other specified cardiac arrhythmias: Principal | ICD-10-CM | POA: Diagnosis present

## 2011-12-13 DIAGNOSIS — I5189 Other ill-defined heart diseases: Secondary | ICD-10-CM | POA: Diagnosis present

## 2011-12-13 DIAGNOSIS — Z7901 Long term (current) use of anticoagulants: Secondary | ICD-10-CM

## 2011-12-13 DIAGNOSIS — Z8546 Personal history of malignant neoplasm of prostate: Secondary | ICD-10-CM

## 2011-12-13 DIAGNOSIS — R001 Bradycardia, unspecified: Secondary | ICD-10-CM | POA: Diagnosis present

## 2011-12-13 DIAGNOSIS — R32 Unspecified urinary incontinence: Secondary | ICD-10-CM | POA: Diagnosis present

## 2011-12-13 DIAGNOSIS — I2789 Other specified pulmonary heart diseases: Secondary | ICD-10-CM | POA: Diagnosis present

## 2011-12-13 DIAGNOSIS — I4891 Unspecified atrial fibrillation: Secondary | ICD-10-CM | POA: Diagnosis present

## 2011-12-13 DIAGNOSIS — I050 Rheumatic mitral stenosis: Secondary | ICD-10-CM | POA: Diagnosis present

## 2011-12-13 DIAGNOSIS — Z23 Encounter for immunization: Secondary | ICD-10-CM

## 2011-12-13 HISTORY — DX: Shortness of breath: R06.02

## 2011-12-13 LAB — CBC WITH DIFFERENTIAL/PLATELET
Basophils Absolute: 0.1 10*3/uL (ref 0.0–0.1)
Basophils Relative: 1 % (ref 0–1)
Eosinophils Absolute: 0.1 10*3/uL (ref 0.0–0.7)
Eosinophils Relative: 2 % (ref 0–5)
HCT: 39.6 % (ref 39.0–52.0)
MCHC: 33.3 g/dL (ref 30.0–36.0)
MCV: 85.2 fL (ref 78.0–100.0)
Monocytes Absolute: 0.7 10*3/uL (ref 0.1–1.0)
Platelets: 164 10*3/uL (ref 150–400)
RDW: 14.8 % (ref 11.5–15.5)

## 2011-12-13 LAB — COMPREHENSIVE METABOLIC PANEL
ALT: 22 U/L (ref 0–53)
AST: 34 U/L (ref 0–37)
Alkaline Phosphatase: 94 U/L (ref 39–117)
BUN: 26 mg/dL — ABNORMAL HIGH (ref 6–23)
Calcium: 9.7 mg/dL (ref 8.4–10.5)
Chloride: 100 mEq/L (ref 96–112)
Creatinine, Ser: 0.96 mg/dL (ref 0.50–1.35)
GFR calc Af Amer: 86 mL/min — ABNORMAL LOW (ref >90)
GFR calc non Af Amer: 74 mL/min — ABNORMAL LOW (ref >90)
Total Protein: 7.8 g/dL (ref 6.0–8.3)

## 2011-12-13 LAB — PROTIME-INR: INR: 1.76 — ABNORMAL HIGH (ref 0.00–1.49)

## 2011-12-13 MED ORDER — ONDANSETRON HCL 4 MG/2ML IJ SOLN: 4.0000 mg | Freq: Four times a day (QID) | INTRAMUSCULAR | Status: DC | PRN

## 2011-12-13 MED ORDER — NITROGLYCERIN 0.4 MG SL SUBL: 0.4000 mg | SUBLINGUAL_TABLET | SUBLINGUAL | Status: DC | PRN

## 2011-12-13 MED ORDER — ZOLPIDEM TARTRATE 5 MG PO TABS: 2.5000 mg | ORAL_TABLET | Freq: Every evening | ORAL | Status: DC | PRN

## 2011-12-13 MED ORDER — WARFARIN - PHARMACIST DOSING INPATIENT: Freq: Every day | Status: DC

## 2011-12-13 MED ORDER — IRBESARTAN 300 MG PO TABS
300.0000 mg | ORAL_TABLET | Freq: Every day | ORAL | Status: DC
  Administered 2011-12-14 – 2011-12-16 (×3): 300 mg via ORAL
  Filled 2011-12-13 (×3): qty 1

## 2011-12-13 MED ORDER — ASPIRIN 81 MG PO CHEW
324.0000 mg | CHEWABLE_TABLET | Freq: Once | ORAL | Status: AC
  Administered 2011-12-13: 324 mg via ORAL
  Filled 2011-12-13: qty 4

## 2011-12-13 MED ORDER — SODIUM CHLORIDE 0.9 % IJ SOLN
3.0000 mL | Freq: Two times a day (BID) | INTRAMUSCULAR | Status: DC
  Administered 2011-12-14 – 2011-12-19 (×8): 3 mL via INTRAVENOUS

## 2011-12-13 MED ORDER — AMLODIPINE BESYLATE 10 MG PO TABS
10.0000 mg | ORAL_TABLET | Freq: Every day | ORAL | Status: DC
  Administered 2011-12-14 – 2011-12-16 (×3): 10 mg via ORAL
  Filled 2011-12-13 (×3): qty 1

## 2011-12-13 MED ORDER — ENOXAPARIN SODIUM 60 MG/0.6ML ~~LOC~~ SOLN
50.0000 mg | Freq: Two times a day (BID) | SUBCUTANEOUS | Status: DC
  Administered 2011-12-13 – 2011-12-15 (×4): 50 mg via SUBCUTANEOUS
  Filled 2011-12-13 (×8): qty 0.6

## 2011-12-13 MED ORDER — SODIUM CHLORIDE 0.9 % IJ SOLN: 3.0000 mL | INTRAMUSCULAR | Status: DC | PRN

## 2011-12-13 MED ORDER — WARFARIN SODIUM 7.5 MG PO TABS
7.5000 mg | ORAL_TABLET | Freq: Once | ORAL | Status: AC
  Administered 2011-12-13: 7.5 mg via ORAL
  Filled 2011-12-13 (×2): qty 1

## 2011-12-13 MED ORDER — ACETAMINOPHEN 325 MG PO TABS
650.0000 mg | ORAL_TABLET | ORAL | Status: DC | PRN
  Administered 2011-12-14 – 2011-12-16 (×2): 650 mg via ORAL
  Filled 2011-12-13 (×2): qty 2

## 2011-12-13 MED ORDER — SODIUM CHLORIDE 0.9 % IV SOLN: 250.0000 mL | INTRAVENOUS | Status: DC | PRN

## 2011-12-13 MED ORDER — INFLUENZA VIRUS VACC SPLIT PF IM SUSP
0.5000 mL | INTRAMUSCULAR | Status: AC
  Administered 2011-12-14: 0.5 mL via INTRAMUSCULAR
  Filled 2011-12-13: qty 0.5

## 2011-12-13 MED ORDER — HYDROCHLOROTHIAZIDE 25 MG PO TABS
25.0000 mg | ORAL_TABLET | Freq: Every day | ORAL | Status: DC
  Administered 2011-12-14 – 2011-12-20 (×7): 25 mg via ORAL
  Filled 2011-12-13 (×7): qty 1

## 2011-12-13 MED ORDER — POLYSACCHARIDE IRON COMPLEX 150 MG PO CAPS
150.0000 mg | ORAL_CAPSULE | Freq: Every day | ORAL | Status: DC
  Administered 2011-12-14 – 2011-12-20 (×7): 150 mg via ORAL
  Filled 2011-12-13 (×7): qty 1

## 2011-12-13 NOTE — Progress Notes (Signed)
ANTICOAGULATION CONSULT NOTE - Initial Consult  Pharmacy Consult for Lovenox and warfarin Indication: atrial fibrillation  No Known Allergies  Patient Measurements:   Weight = 52kg  Vital Signs: Temp: 97.5 F (36.4 C) (09/27 1146) Temp src: Oral (09/27 1146) BP: 161/83 mmHg (09/27 1455) Pulse Rate: 64  (09/27 1455)  Labs:  Basename 12/13/11 1340  HGB 13.2  HCT 39.6  PLT 164  APTT --  LABPROT 19.9*  INR 1.76*  HEPARINUNFRC --  CREATININE 0.96  CKTOTAL --  CKMB --  TROPONINI <0.30      Medical History: Past Medical History  Diagnosis Date  . Rheumatic heart disease     moderate mitral stenosis and history of CHF  . Hypertension   . Sick sinus syndrome      Paroxysmal and then constant AF; slow rate on modest AV nodal blocking agents  . Prostate cancer     Carcinoma of the prostate  . Chronic low back pain   . Dementia   . Atrial fibrillation     Per record has Afib/Aflutter with SSS  . PVD (peripheral vascular disease)     Right superficial femoral to peroneal artery bypass graft with nonreversed saphenous vein composite  2002  . Mesenteric ischemia     SMA artery embolectomy in 2002    Medications:  Prescriptions prior to admission  Medication Sig Dispense Refill  . amLODipine (NORVASC) 10 MG tablet Take 1 tablet (10 mg total) by mouth daily.  30 tablet  0  . hydrALAZINE (APRESOLINE) 50 MG tablet Take 75 mg by mouth 3 (three) times daily.      . hydrochlorothiazide (HYDRODIURIL) 25 MG tablet Take 25 mg by mouth daily.      . iron polysaccharides (NIFEREX) 150 MG capsule Take 150 mg by mouth daily.      Marland Kitchen olmesartan (BENICAR) 40 MG tablet Take 1 tablet (40 mg total) by mouth daily.  30 tablet  6  . warfarin (COUMADIN) 5 MG tablet Take 2.5-5 mg by mouth daily. Take coumadin 1/2 tablet daily except 1 tablet on Tuesdays and Fridays.      Marland Kitchen zolpidem (AMBIEN) 5 MG tablet Take 2.5 mg by mouth at bedtime as needed.        Assessment: 78 year old man on  warfarin prior to admission for atrial fib to continue on warfarin while hospitalized.  INR 1.76 today.  Will also start Lovenox as a bridge until INR > 2.  Renal function appears normal for his age. Goal of Therapy:  INR 2-3 Monitor platelets by anticoagulation protocol: Yes   Plan:  Lovenox 50 mg sq Q12 Warfarin7.5mg  x 1 dose today. Daily protimes.  Mickeal Skinner 12/13/2011,6:51 PM

## 2011-12-13 NOTE — Consult Note (Signed)
History and Physical  Patient ID: [name] MRN: [id number], DOB: Jul 12, 1924 Date of Encounter: 12/13/2011, 5:47 PM Primary Physician: Kirk Ruths, MD  Primary Cardiologist: Dr. Dietrich Pates   Chief Complaint: dizziness, confusion  Reason for Consult: bradycardia   HPI: [name] is an 78 y/o M with hx of mild dementia, chronic atrial fibrillation on Coumadin, known sick sinus syndrome/chronic bradycardia, rheumatic heart dz with mod MS, uncontrolled HTN, remote hx of PVD and mesenteric ischemia s/p SMA emobectomy. He presented with complaints of an episode of weakness. He was actually just in the hospital at [facility] a few days ago with similar complaints and had addition of Norvasc and Hydralazine for uncontrolled BP. He was last seen in the OP setting 06/2011 by Joni Reining at which time his bradycardia was assessed. He had OP cardiac monitoring showing a 3.2sec pause. He was not having syncopal episodes at that time and was able to remain active, thus the decision was made to hold off on EP eval/pacemaker. A walking rhythm strip in the office at that time showed HR 40-44. Normal TSH at that time.  It was not clear during his [facility] admission 12/06/11 that his bradycardia was in fact related to his weakness, and the decision was made to hold off on pacemaker. He returned to [facility] this morning complaining of another episode of slumping. Today he was sitting in his yard on a bench with a friend and started to slump over.  The friend caught him before he fell off the bench and he did not injure himself. Patient's son feels that the patient has been more dizzy and unsteady since he was discharged home 12/09/11 on two additional BP meds hydralazine 75mg  TID and amlodipine 10 mg daily while continuing his prior doses of benicar 40 mg daily and HCTZ 25 mg daily.  Patient has a past history of rheumatic heart disease with Mitral stenosis and aortic insufficiency and has had chronic atrial fib on warfarin.      Past Medical History  Diagnosis Date  . Rheumatic heart disease     moderate mitral stenosis and history of CHF  . Hypertension   . Sick sinus syndrome      Paroxysmal and then constant AF; slow rate on modest AV nodal blocking agents  . Prostate cancer     Carcinoma of the prostate  . Chronic low back pain   . Dementia   . Atrial fibrillation     Per record has Afib/Aflutter with SSS  . PVD (peripheral vascular disease)     Right superficial femoral to peroneal artery bypass graft with nonreversed saphenous vein composite  2002  . Mesenteric ischemia     SMA artery embolectomy in 2002     Most Recent Cardiac Studies: 2D Echo 03/2010 Study Conclusions  - Left ventricle: The cavity size was normal. There was mild  concentric hypertrophy. Systolic function was normal. The  estimated ejection fraction was in the range of 55% to 60%. Wall  motion was normal; there were no regional wall motion  abnormalities.  - Ventricular septum: Mild septaldyssynergy and diastolic  flattening.  - Aortic valve: Mild regurgitation.  - Mitral valve: Calcified annulus. Mildly to moderately thickened  leaflets . Mobility was moderately restricted consistent with  rheumatic MS. The findings are consistent with moderate stenosis.  Mild regurgitation. Valve area by pressure half-time: 1.05cm\S\2.  - Left atrium: The atrium was moderately dilated.  - Right atrium: The atrium was moderately dilated.  - Atrial  septum: No defect or patent foramen ovale was identified.  - Tricuspid valve: Moderate regurgitation.  - Pulmonary arteries: Systolic pressure was mildly increased. PA  peak pressure: 58mm Hg (S).  Transthoracic echocardiography       Surgical History:  Past Surgical History  Procedure Date  . Transurethral resection of prostate      Home Meds: Prior to Admission medications   Medication Sig Start Date End Date Taking? Authorizing Provider  amLODipine (NORVASC) 10 MG tablet Take 1  tablet (10 mg total) by mouth daily. 12/09/11  Yes Nimish Normajean Glasgow, MD  hydrALAZINE (APRESOLINE) 50 MG tablet Take 75 mg by mouth 3 (three) times daily. 12/09/11  Yes Nimish Normajean Glasgow, MD  iron polysaccharides (NIFEREX) 150 MG capsule Take 150 mg by mouth daily.   Yes Historical Provider, MD  olmesartan (BENICAR) 40 MG tablet Take 1 tablet (40 mg total) by mouth daily. 07/16/11 07/15/12 Yes Jodelle Gross, NP  warfarin (COUMADIN) 5 MG tablet Take 2.5-5 mg by mouth daily. Take coumadin 1/2 tablet daily except 1 tablet on Tuesdays and Fridays.   Yes Historical Provider, MD  zolpidem (AMBIEN) 5 MG tablet Take 2.5 mg by mouth at bedtime as needed.    Historical Provider, MD    Allergies: No Known Allergies  History   Social History  . Marital Status: Widowed    Spouse Name: N/A    Number of Children: N/A  . Years of Education: N/A   Occupational History  . retired     yard work Materials engineer   Social History Main Topics  . Smoking status: Never Smoker   . Smokeless tobacco: Never Used  . Alcohol Use: No  . Drug Use: No  . Sexually Active: Not on file   Other Topics Concern  . Not on file   Social History Narrative  . Patient has a supportive family. Lives with a daughter. A son lives in the next town and checks on him frequently.  Patient is retired from [employer] which his son now does.    Family History: pt does not recall  Review of Systems: General: negative for chills, fever, night sweats or weight changes.  Cardiovascular: negative for chest pain, edema, orthopnea, palpitations, paroxysmal nocturnal dyspnea, positive for exertional dyspnea. Dermatological: negative for rash Respiratory: negative for cough or wheezing Urologic: negative for hematuria.  Patient has Hx of prostate cancer and has incontinence. Abdominal: negative for nausea, vomiting, diarrhea, bright red blood per rectum, melena, or hematemesis Neurologic: negative for visual changes, positive  for syncope and presyncope and dizziness. All other systems reviewed and are otherwise negative except as noted above.  Labs:   Lab Results  Component Value Date   WBC 5.1 12/13/2011   HGB 13.2 12/13/2011   HCT 39.6 12/13/2011   MCV 85.2 12/13/2011   PLT 164 12/13/2011    Lab 12/13/11 1340  NA 135  K 4.5  CL 100  CO2 27  BUN 26*  CREATININE 0.96  CALCIUM 9.7  PROT 7.8  BILITOT 0.5  ALKPHOS 94  ALT 22  AST 34  GLUCOSE 97    Basename 12/13/11 1340  CKTOTAL --  CKMB --  TROPONINI <0.30   Lab Results  Component Value Date   CHOL 219* 09/22/2009   HDL 56 09/22/2009   LDLCALC 161* 09/22/2009   TRIG 104 09/22/2009    Radiology/Studies:  Dg Chest 2 View 12/06/2011  *RADIOLOGY REPORT*  Clinical Data: Weakness.  CHEST - 2  VIEW  Comparison: Plain films of the chest 05/01/2007 and 06/30/2008.  Findings: Marked cardiomegaly is again seen. Upper lobe scar on the left is unchanged.  Lungs are otherwise clear.  No pneumothorax or pleural fluid.  IMPRESSION: Cardiomegaly without acute disease.   Original Report Authenticated By: Bernadene Bell. Maricela Curet, M.D.    Ct Head Wo Contrast \\9 /27/2013  *RADIOLOGY REPORT*  Clinical Data: Syncope.  CT HEAD WITHOUT CONTRAST  Technique:  Contiguous axial images were obtained from the base of the skull through the vertex without contrast.  Comparison: 12/06/2011.  Findings: No evidence of acute infarct, acute hemorrhage, mass lesion, mass effect or hydrocephalus.  There may be a small remote lacunar infarct in the left basal ganglia.  Atrophy. Periventricular low attenuation.  Opacification of a single right ethmoid air cell.  Visualized portions of the paranasal sinuses and mastoid air cells are otherwise clear.  IMPRESSION:  1.  No acute intracranial abnormality. 2.  Atrophy and chronic microvascular white matter ischemic changes.   Original Report Authenticated By: Reyes Ivan, M.D.    Ct Head Wo Contrast 12/06/2011  *RADIOLOGY REPORT*  Clinical Data: Weakness.   CT HEAD WITHOUT CONTRAST  Technique:  Contiguous axial images were obtained from the base of the skull through the vertex without contrast.  Comparison: None.  Findings: There is cortical atrophy and chronic microvascular ischemic change.  No evidence of acute infarction, hemorrhage, mass lesion, mass effect, midline shift or abnormal extra-axial fluid collection.  No hydrocephalus or pneumocephalus.  Calvarium intact.  IMPRESSION: No acute finding.  Atrophy and chronic microvascular ischemic change.   Original Report Authenticated By: Bernadene Bell. Maricela Curet, M.D.   Dg Chest Port 1 View  12/13/2011  *RADIOLOGY REPORT*  Clinical Data: Chest pain.  PORTABLE CHEST - 1 VIEW  Comparison: 12/06/2011.  Findings: Trachea is midline.  Heart is enlarged as are the pulmonary arteries.  Left atrial appendage appears prominent. Lungs are emphysematous with scarring at the apices.  There may be tiny bilateral pleural effusions.  IMPRESSION:  1.  Emphysema with biapical pleural parenchymal scarring. 2.  Cardiac enlargement with pulmonary arterial hypertension. 3.  Question tiny bilateral pleural effusions.   Original Report Authenticated By: Reyes Ivan, M.D.     EKG: Atrial fibrillation with controlled ventricular response.  Physical Exam: Blood pressure 161/83, pulse 64, temperature 97.5 F (36.4 C), temperature source Oral, resp. rate 17, SpO2 100.00%. General: Well developed, well nourished, in no acute distress. Poor memory secondary to dementia. Head: Normocephalic, atraumatic, sclera non-icteric, no xanthomas, nares are without discharge.  Neck: Negative for carotid bruits. JVD not elevated. Lungs: Clear bilaterally to auscultation without wheezes, rales, or rhonchi. Breathing is unlabored. Heart: Irregular rhythm with controlled ventricular response. Grade 2/6 decrescendo murmur of aortic insufficiency at LSE.  No gallop or rub. Abdomen: Soft, non-tender, non-distended with normoactive bowel sounds. No  hepatomegaly. No rebound/guarding. No obvious abdominal masses. Large old midline scar. He is unable to recall what operation was for. Msk:  Strength and tone appear normal for age. Extremities: No clubbing or cyanosis. No edema.  Distal pedal pulses are 1+ and equal bilaterally. Neuro: Alert. Decreased memory. No focal deficit. No facial asymmetry. Moves all extremities spontaneously. Psych:  Responds to questions appropriately with a normal affect.    ASSESSMENT AND PLAN:  1. Dizziness and presyncope ? Etiology. Possibly secondary to BP meds. 2. Atrial fibrillation, chronic. 3. History of rheumatic heart disease with aortic insufficiency and mitral stenosis. 4. Hypertensive cardiovascular disease. 5. Past  history of SSS 6. Chronic warfarin, past history of embolus to SMA. 7. Dementia 8. Urinary incontinence, history of prostate cancer.  Plan: Admit to telemetry to monitor for bradycardic episodes.  He is not on any AV node blocking drugs. At this point it does not appear likely that he will need a pacemaker so we will continue warfarin in view of his prior embolus and his history of mitral stenosis and atrial fib. Will bridge with lovenox until INR >2.0. We will discontinue his hydralazine but continue his amlodipine since family thinks that he has been more unsteady since additional BP meds were started earlier this week. Will check orthostatic BPs. Daily weights.  Will update 2D echo.  Karie Schwalbe  12/13/2011, 5:47 PM

## 2011-12-13 NOTE — ED Notes (Signed)
[name], son of Mr. Bise  [phone]

## 2011-12-13 NOTE — ED Notes (Signed)
Pt had recent change in BP med. EMS, states family stated pt was sitting on a log and became dizzy
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
## 2011-12-14 ENCOUNTER — Other Ambulatory Visit: Payer: Self-pay

## 2011-12-14 DIAGNOSIS — I059 Rheumatic mitral valve disease, unspecified: Secondary | ICD-10-CM

## 2011-12-14 LAB — PROTIME-INR
INR: 1.94 — ABNORMAL HIGH (ref 0.00–1.49)
Prothrombin Time: 21.4 seconds — ABNORMAL HIGH (ref 11.6–15.2)

## 2011-12-14 LAB — BASIC METABOLIC PANEL
BUN: 20 mg/dL (ref 6–23)
CO2: 24 mEq/L (ref 19–32)
Calcium: 9.1 mg/dL (ref 8.4–10.5)
Chloride: 103 mEq/L (ref 96–112)
Creatinine, Ser: 0.96 mg/dL (ref 0.50–1.35)
GFR calc Af Amer: 86 mL/min — ABNORMAL LOW (ref >90)
GFR calc non Af Amer: 74 mL/min — ABNORMAL LOW (ref >90)
Glucose, Bld: 86 mg/dL (ref 70–99)
Potassium: 4.2 mEq/L (ref 3.5–5.1)
Sodium: 136 mEq/L (ref 135–145)

## 2011-12-14 LAB — MAGNESIUM: Magnesium: 2.1 mg/dL (ref 1.5–2.5)

## 2011-12-14 MED ORDER — ENSURE COMPLETE PO LIQD
237.0000 mL | Freq: Two times a day (BID) | ORAL | Status: DC
  Administered 2011-12-14 – 2011-12-20 (×9): 237 mL via ORAL

## 2011-12-14 MED ORDER — WARFARIN SODIUM 5 MG PO TABS
5.0000 mg | ORAL_TABLET | Freq: Once | ORAL | Status: AC
  Administered 2011-12-14: 5 mg via ORAL
  Filled 2011-12-14: qty 1

## 2011-12-14 NOTE — Progress Notes (Signed)
Patient ID: [name], male   DOB: Aug 20, 1924, 78 y.o.   MRN: [id number]    Subjective:  Denies SSCP, palpitations or Dyspnea   Objective:  Filed Vitals:   12/13/11 2102 12/13/11 2104 12/13/11 2107 12/14/11 0400  BP: 148/80 170/75 159/85 164/70  Pulse: 62 64 66 52  Temp:    98.3 F (36.8 C)  TempSrc:      Resp:    18  Height:      Weight:    108 lb 3.2 oz (49.079 kg)  SpO2:    98%    Intake/Output from previous day:  Intake/Output Summary (Last 24 hours) at 12/14/11 1004 Last data filed at 12/14/11 0900  Gross per 24 hour  Intake    130 ml  Output    450 ml  Net   -320 ml    Physical Exam: Affect appropriate Thin frail black male HEENT: normal Neck supple with no adenopathy JVP normal no bruits no thyromegaly Lungs clear with no wheezing and good diaphragmatic motion Heart:  S1/S2 systolic  murmur, no rub, gallop or click PMI normal Abdomen: benighn, BS positve, no tenderness, no AAA no bruit.  No HSM or HJR Distal pulses intact with no bruits No edema Neuro non-focal Skin warm and dry No muscular weakness   Lab Results: Basic Metabolic Panel:  Basename 12/14/11 0619 12/13/11 1340  NA 136 135  K 4.2 4.5  CL 103 100  CO2 24 27  GLUCOSE 86 97  BUN 20 26*  CREATININE 0.96 0.96  CALCIUM 9.1 9.7  MG 2.1 --  PHOS -- --   Liver Function Tests:  Basename 12/13/11 1340  AST 34  ALT 22  ALKPHOS 94  BILITOT 0.5  PROT 7.8  ALBUMIN 3.6   No results found for this basename: LIPASE:2,AMYLASE:2 in the last 72 hours CBC:  Basename 12/13/11 1340  WBC 5.1  NEUTROABS 3.4  HGB 13.2  HCT 39.6  MCV 85.2  PLT 164   Cardiac Enzymes:  Basename 12/13/11 1340  CKTOTAL --  CKMB --  CKMBINDEX --  TROPONINI <0.30    Imaging: Ct Head Wo Contrast  12/13/2011  *RADIOLOGY REPORT*  Clinical Data: Syncope.  CT HEAD WITHOUT CONTRAST  Technique:  Contiguous axial images were obtained from the base of the skull through the vertex without contrast.   Comparison: 12/06/2011.  Findings: No evidence of acute infarct, acute hemorrhage, mass lesion, mass effect or hydrocephalus.  There may be a small remote lacunar infarct in the left basal ganglia.  Atrophy. Periventricular low attenuation.  Opacification of a single right ethmoid air cell.  Visualized portions of the paranasal sinuses and mastoid air cells are otherwise clear.  IMPRESSION:  1.  No acute intracranial abnormality. 2.  Atrophy and chronic microvascular white matter ischemic changes.   Original Report Authenticated By: Reyes Ivan, M.D.    Dg Chest Port 1 View  12/13/2011  *RADIOLOGY REPORT*  Clinical Data: Chest pain.  PORTABLE CHEST - 1 VIEW  Comparison: 12/06/2011.  Findings: Trachea is midline.  Heart is enlarged as are the pulmonary arteries.  Left atrial appendage appears prominent. Lungs are emphysematous with scarring at the apices.  There may be tiny bilateral pleural effusions.  IMPRESSION:  1.  Emphysema with biapical pleural parenchymal scarring. 2.  Cardiac enlargement with pulmonary arterial hypertension. 3.  Question tiny bilateral pleural effusions.   Original Report Authenticated By: Reyes Ivan, M.D.     Cardiac Studies:  ECG:  Slow  atrial fibrillation/flutter  Rate 54  LVH with strain   Telemetry: Slow fib flutter rates no lower than 40-45 bpm  Echo: pending  Medications:     . amLODipine  10 mg Oral Daily  . aspirin  324 mg Oral Once  . enoxaparin (LOVENOX) injection  50 mg Subcutaneous Q12H  . hydrochlorothiazide  25 mg Oral Daily  . influenza  inactive virus vaccine  0.5 mL Intramuscular Tomorrow-1000  . irbesartan  300 mg Oral Daily  . iron polysaccharides  150 mg Oral Daily  . sodium chloride  3 mL Intravenous Q12H  . warfarin  5 mg Oral ONCE-1800  . warfarin  7.5 mg Oral ONCE-1800  . Warfarin - Pharmacist Dosing Inpatient   Does not apply q1800       Assessment/Plan:  Bradycardia:  Patient has been evaluated by Dr Ladona Ridgel  No clear  association of bradycardia with symptoms.  However he keeps getting admitted To hospital.  Will need to be reevaluated on Monday  By EPS BP:  Improved not postural  Afib: chronic still on coumadin despite fall risk see comments by admitting phycisian  Charlton Haws 12/14/2011, 10:04 AM

## 2011-12-14 NOTE — Progress Notes (Signed)
INITIAL ADULT NUTRITION ASSESSMENT Date: 12/14/2011   Time: 2:29 PM  Reason for Assessment: Malnutrition Screening  INTERVENTION: 1. Ensure Complete po BID, each supplement provides 350 kcal and 13 grams of protein. 2. RD to continue to follow nutrition care plan  DOCUMENTATION CODES Per approved criteria  -Underweight   ASSESSMENT: Male 78 y.o.  Dx: episode of dizziness and near loss of consciousness  Hx:  Past Medical History  Diagnosis Date  . Rheumatic heart disease     moderate mitral stenosis and history of CHF  . Hypertension   . Sick sinus syndrome      Paroxysmal and then constant AF; slow rate on modest AV nodal blocking agents  . Chronic low back pain   . Atrial fibrillation     Per record has Afib/Aflutter with SSS  . PVD (peripheral vascular disease)     Right superficial femoral to peroneal artery bypass graft with nonreversed saphenous vein composite  2002  . Mesenteric ischemia     SMA artery embolectomy in 2002  . Dementia     mild/H&P (12/13/2011)  . Prostate cancer   . Shortness of breath     "@ night" (12/13/2011)   Past Surgical History  Procedure Date  . Transurethral resection of prostate   . Embolectomy 2002    SMA artery  . Femoral-peroneal bypass graft 2002    right superficial (12/13/2011)   Related Meds:     . amLODipine  10 mg Oral Daily  . enoxaparin (LOVENOX) injection  50 mg Subcutaneous Q12H  . hydrochlorothiazide  25 mg Oral Daily  . influenza  inactive virus vaccine  0.5 mL Intramuscular Tomorrow-1000  . irbesartan  300 mg Oral Daily  . iron polysaccharides  150 mg Oral Daily  . sodium chloride  3 mL Intravenous Q12H  . warfarin  5 mg Oral ONCE-1800  . warfarin  7.5 mg Oral ONCE-1800  . Warfarin - Pharmacist Dosing Inpatient   Does not apply q1800   Ht: 5\' 6"  (167.6 cm)  Wt: 108 lb 3.2 oz (49.079 kg)  Ideal Wt: 142 lb/64.5 kg % Ideal Wt: 76%  Wt Readings from Last 15 Encounters:  12/14/11 108 lb 3.2 oz (49.079 kg)    12/09/11 114 lb 10.2 oz (52 kg)  07/16/11 108 lb (48.988 kg)  06/27/11 112 lb (50.803 kg)  04/13/10 118 lb (53.524 kg)  10/30/09 118 lb (53.524 kg)  09/11/09 118 lb (53.524 kg)  Usual Wt: 112 - 118 lb % Usual Wt: 95.6%  Body mass index is 17.46 kg/(m^2). Underweight  Food/Nutrition Related Hx: history of dementia  Labs:  CMP     Component Value Date/Time   NA 136 12/14/2011 0619   K 4.2 12/14/2011 0619   CL 103 12/14/2011 0619   CO2 24 12/14/2011 0619   GLUCOSE 86 12/14/2011 0619   BUN 20 12/14/2011 0619   CREATININE 0.96 12/14/2011 0619   CREATININE 1.10 06/27/2011 1413   CALCIUM 9.1 12/14/2011 0619   PROT 7.8 12/13/2011 1340   ALBUMIN 3.6 12/13/2011 1340   AST 34 12/13/2011 1340   ALT 22 12/13/2011 1340   ALKPHOS 94 12/13/2011 1340   BILITOT 0.5 12/13/2011 1340   GFRNONAA 74* 12/14/2011 0619   GFRAA 86* 12/14/2011 0619   No results found for this basename: phos   Magnesium  Date/Time Value Range Status  12/14/2011  6:19 AM 2.1  1.5 - 2.5 mg/dL Final    Intake/Output Summary (Last 24 hours) at 12/14/11 1432 Last  data filed at 12/14/11 1257  Gross per 24 hour  Intake    370 ml  Output    450 ml  Net    -80 ml  BM: PTA  Diet Order: Cardiac  Supplements/Tube Feeding: none  IVF:    Estimated Nutritional Needs:   Kcal: 1300 - 1500 kcal Protein: 60 - 75 grams Fluid:  1.5 - 1.7 liters daily  Pt admitted for episode of dizziness and near loss of consciousness. Hx of severe bradycardia. Pt is currently on Heart Healthy diet eating 80 - 100%. No skin breakdown noted. Pt with hx of dementia. Pt reported upon admission that he has lost weight without trying, and of approximately 10 lb.  Discussed nutrition intake with family at bedside. Noted pt is very hard of hearing. Family notes that pt has always been small-framed. But pt reports that he doesn't usually eat 3 meals a day. States that he has difficulty gaining/maintaining his weight. Family is agreeable to Ensure Complete.  Family denies any issues with chewing or swallowing at this time.  Pt is at nutrition risk given advanced age and low BMI.   NUTRITION DIAGNOSIS: Inadequate oral intake r/t limited appetite AEB pt report and inability to gain/maintain weight.  MONITORING/EVALUATION(Goals): Goal: Pt to meet >/= 90% of their estimated nutrition needs Monitor: weight trends, lab trends, I/O's, PO intake, supplement tolerance  EDUCATION NEEDS: -No education needs identified at this time  Jarold Motto MS, RD, LDN Pager: [phone] After-hours pager: [phone]

## 2011-12-14 NOTE — Progress Notes (Signed)
ANTICOAGULATION CONSULT NOTE - Follow Up Consult  Pharmacy Consult for Coumdadin/Lovenox Indication: atrial fibrillation  No Known Allergies  Patient Measurements: Height: 5\' 6"  (167.6 cm) Weight: 108 lb 3.2 oz (49.079 kg) IBW/kg (Calculated) : 63.8   Vital Signs: Temp: 98.3 F (36.8 C) (09/28 0400) BP: 164/70 mmHg (09/28 0400) Pulse Rate: 52  (09/28 0400)  Labs:  Basename 12/14/11 0619 12/13/11 1340  HGB -- 13.2  HCT -- 39.6  PLT -- 164  APTT -- --  LABPROT 21.4* 19.9*  INR 1.94* 1.76*  HEPARINUNFRC -- --  CREATININE 0.96 0.96  CKTOTAL -- --  CKMB -- --  TROPONINI -- <0.30    Estimated Creatinine Clearance: 39.8 ml/min (by C-G formula based on Cr of 0.96).   Medications:  Scheduled:    . amLODipine  10 mg Oral Daily  . aspirin  324 mg Oral Once  . enoxaparin (LOVENOX) injection  50 mg Subcutaneous Q12H  . hydrochlorothiazide  25 mg Oral Daily  . influenza  inactive virus vaccine  0.5 mL Intramuscular Tomorrow-1000  . irbesartan  300 mg Oral Daily  . iron polysaccharides  150 mg Oral Daily  . sodium chloride  3 mL Intravenous Q12H  . warfarin  7.5 mg Oral ONCE-1800  . Warfarin - Pharmacist Dosing Inpatient   Does not apply q1800    Assessment: 78 year old man on Coumadin prior to admission for atrial fib to continue on Coumadin while hospitalized. Home regimen is 2.5mg  everyday, except for 5mg  on Tuesdays and Fridays. INR 1.76 on admission. Lovenox also started as a bridge until INR > 2. INR today is 1.94. Renal function appears normal for his age. CBC wnl and no bleeding or bruising noted.  Goal of Therapy:  INR 2-3 Monitor platelets by anticoagulation protocol: Yes   Plan:  Continue lovenox 50mg  SQ q 12hrs until INR>2 Coumadin 5mg  today at 1800 Daily PT/INR  Lillia Pauls, PharmD Clinical Pharmacist Pager: [phone] Phone: [phone] 12/14/2011 8:52 AM

## 2011-12-14 NOTE — Progress Notes (Signed)
  Echocardiogram 2D Echocardiogram has been performed.  [name], [name] 12/14/2011, 11:45 AM

## 2011-12-15 LAB — PROTIME-INR: INR: 2.83 — ABNORMAL HIGH (ref 0.00–1.49)

## 2011-12-15 NOTE — Progress Notes (Signed)
Patient ID: [name], male   DOB: 06/21/1924, 78 y.o.   MRN: [id number]    Subjective:  Denies SSCP, palpitations or Dyspnea   Objective:  Filed Vitals:   12/14/11 0400 12/14/11 1700 12/14/11 2100 12/15/11 0500  BP: 164/70 160/88 149/69 148/73  Pulse: 52 50 46 62  Temp: 98.3 F (36.8 C) 97.4 F (36.3 C) 97.8 F (36.6 C) 98.5 F (36.9 C)  TempSrc:  Oral Oral Oral  Resp: 18 16 18 18   Height:      Weight: 108 lb 3.2 oz (49.079 kg)   113 lb 12.1 oz (51.6 kg)  SpO2: 98% 99% 100% 99%    Intake/Output from previous day:  Intake/Output Summary (Last 24 hours) at 12/15/11 4098 Last data filed at 12/15/11 1191  Gross per 24 hour  Intake    824 ml  Output    900 ml  Net    -76 ml    Physical Exam: Affect appropriate Thin frail black male HEENT: normal Neck supple with no adenopathy JVP normal no bruits no thyromegaly Lungs clear with no wheezing and good diaphragmatic motion Heart:  S1/S2 systolic  murmur, no rub, gallop or click PMI normal Abdomen: benighn, BS positve, no tenderness, no AAA no bruit.  No HSM or HJR Distal pulses intact with no bruits No edema Neuro non-focal Skin warm and dry No muscular weakness   Lab Results: Basic Metabolic Panel:  Basename 12/14/11 0619 12/13/11 1340  NA 136 135  K 4.2 4.5  CL 103 100  CO2 24 27  GLUCOSE 86 97  BUN 20 26*  CREATININE 0.96 0.96  CALCIUM 9.1 9.7  MG 2.1 --  PHOS -- --   Liver Function Tests:  Basename 12/13/11 1340  AST 34  ALT 22  ALKPHOS 94  BILITOT 0.5  PROT 7.8  ALBUMIN 3.6   No results found for this basename: LIPASE:2,AMYLASE:2 in the last 72 hours CBC:  Basename 12/13/11 1340  WBC 5.1  NEUTROABS 3.4  HGB 13.2  HCT 39.6  MCV 85.2  PLT 164   Cardiac Enzymes:  Basename 12/13/11 1340  CKTOTAL --  CKMB --  CKMBINDEX --  TROPONINI <0.30    Imaging: Ct Head Wo Contrast  12/13/2011  *RADIOLOGY REPORT*  Clinical Data: Syncope.  CT HEAD WITHOUT CONTRAST  Technique:   Contiguous axial images were obtained from the base of the skull through the vertex without contrast.  Comparison: 12/06/2011.  Findings: No evidence of acute infarct, acute hemorrhage, mass lesion, mass effect or hydrocephalus.  There may be a small remote lacunar infarct in the left basal ganglia.  Atrophy. Periventricular low attenuation.  Opacification of a single right ethmoid air cell.  Visualized portions of the paranasal sinuses and mastoid air cells are otherwise clear.  IMPRESSION:  1.  No acute intracranial abnormality. 2.  Atrophy and chronic microvascular white matter ischemic changes.   Original Report Authenticated By: Reyes Ivan, M.D.    Dg Chest Port 1 View  12/13/2011  *RADIOLOGY REPORT*  Clinical Data: Chest pain.  PORTABLE CHEST - 1 VIEW  Comparison: 12/06/2011.  Findings: Trachea is midline.  Heart is enlarged as are the pulmonary arteries.  Left atrial appendage appears prominent. Lungs are emphysematous with scarring at the apices.  There may be tiny bilateral pleural effusions.  IMPRESSION:  1.  Emphysema with biapical pleural parenchymal scarring. 2.  Cardiac enlargement with pulmonary arterial hypertension. 3.  Question tiny bilateral pleural effusions.   Original Report Authenticated  By: Reyes Ivan, M.D.     Cardiac Studies:  ECG:  Slow atrial fibrillation/flutter  Rate 54  LVH with strain   Telemetry: Slow fib flutter rates no lower than 40-45 bpm  Echo: pending  Medications:      . amLODipine  10 mg Oral Daily  . enoxaparin (LOVENOX) injection  50 mg Subcutaneous Q12H  . feeding supplement  237 mL Oral BID BM  . hydrochlorothiazide  25 mg Oral Daily  . influenza  inactive virus vaccine  0.5 mL Intramuscular Tomorrow-1000  . irbesartan  300 mg Oral Daily  . iron polysaccharides  150 mg Oral Daily  . sodium chloride  3 mL Intravenous Q12H  . warfarin  5 mg Oral ONCE-1800  . Warfarin - Pharmacist Dosing Inpatient   Does not apply q1800        Assessment/Plan:  Bradycardia:  Patient has been evaluated by Dr Ladona Ridgel  No clear association of bradycardia with symptoms.  However he keeps getting admitted To hospital.  Will need to be reevaluated on Monday  By EPS BP:  Improved not postural  Afib: chronic still on coumadin despite fall risk see comments by admitting phycisian Will hold incase pacer needed  Charlton Haws 12/15/2011, 9:38 AM

## 2011-12-16 ENCOUNTER — Encounter (HOSPITAL_COMMUNITY)
Admission: EM | Disposition: A | Discharge status: Home / Self Care | Payer: Self-pay | Source: Home / Self Care | Attending: Cardiology

## 2011-12-16 DIAGNOSIS — I498 Other specified cardiac arrhythmias: Principal | ICD-10-CM

## 2011-12-16 DIAGNOSIS — I495 Sick sinus syndrome: Secondary | ICD-10-CM

## 2011-12-16 HISTORY — PX: PERMANENT PACEMAKER INSERTION: SHX5480

## 2011-12-16 LAB — PROTIME-INR
INR: 2.25 — ABNORMAL HIGH (ref 0.00–1.49)
Prothrombin Time: 23.9 seconds — ABNORMAL HIGH (ref 11.6–15.2)

## 2011-12-16 SURGERY — PERMANENT PACEMAKER INSERTION
Anesthesia: LOCAL

## 2011-12-16 MED ORDER — HEPARIN (PORCINE) IN NACL 2-0.9 UNIT/ML-% IJ SOLN
INTRAMUSCULAR | Status: AC
  Filled 2011-12-16: qty 500

## 2011-12-16 MED ORDER — CEFAZOLIN SODIUM-DEXTROSE 2-3 GM-% IV SOLR
2.0000 g | Freq: Once | INTRAVENOUS | Status: DC
  Filled 2011-12-16: qty 50

## 2011-12-16 MED ORDER — CEFAZOLIN SODIUM 1-5 GM-% IV SOLN
INTRAVENOUS | Status: AC
  Filled 2011-12-16: qty 100

## 2011-12-16 MED ORDER — ACETAMINOPHEN 325 MG PO TABS: 325.0000 mg | ORAL_TABLET | ORAL | Status: DC | PRN

## 2011-12-16 MED ORDER — CHLORHEXIDINE GLUCONATE 4 % EX LIQD: 60.0000 mL | Freq: Once | CUTANEOUS | Status: DC

## 2011-12-16 MED ORDER — AMLODIPINE BESYLATE 2.5 MG PO TABS
2.5000 mg | ORAL_TABLET | Freq: Every day | ORAL | Status: DC
  Administered 2011-12-17 – 2011-12-20 (×4): 2.5 mg via ORAL
  Filled 2011-12-16 (×4): qty 1

## 2011-12-16 MED ORDER — SODIUM CHLORIDE 0.9 % IR SOLN
80.0000 mg | Status: DC
  Filled 2011-12-16: qty 2

## 2011-12-16 MED ORDER — SODIUM CHLORIDE 0.9 % IV SOLN: INTRAVENOUS | Status: AC

## 2011-12-16 MED ORDER — SODIUM CHLORIDE 0.9 % IJ SOLN: 3.0000 mL | INTRAMUSCULAR | Status: DC | PRN

## 2011-12-16 MED ORDER — SODIUM CHLORIDE 0.9 % IV SOLN: 250.0000 mL | INTRAVENOUS | Status: DC

## 2011-12-16 MED ORDER — CEFAZOLIN SODIUM 1-5 GM-% IV SOLN
1.0000 g | Freq: Four times a day (QID) | INTRAVENOUS | Status: AC
  Administered 2011-12-16 – 2011-12-17 (×3): 1 g via INTRAVENOUS
  Filled 2011-12-16 (×3): qty 50

## 2011-12-16 MED ORDER — SODIUM CHLORIDE 0.9 % IJ SOLN: 3.0000 mL | Freq: Two times a day (BID) | INTRAMUSCULAR | Status: DC

## 2011-12-16 MED ORDER — LIDOCAINE HCL (PF) 1 % IJ SOLN
INTRAMUSCULAR | Status: AC
  Filled 2011-12-16: qty 60

## 2011-12-16 MED ORDER — SODIUM CHLORIDE 0.9 % IR SOLN
Freq: Once | Status: DC
  Filled 2011-12-16: qty 2

## 2011-12-16 MED ORDER — SODIUM CHLORIDE 0.45 % IV SOLN
INTRAVENOUS | Status: DC
  Administered 2011-12-16: 16:00:00 via INTRAVENOUS

## 2011-12-16 MED ORDER — ONDANSETRON HCL 4 MG/2ML IJ SOLN: 4.0000 mg | Freq: Four times a day (QID) | INTRAMUSCULAR | Status: DC | PRN

## 2011-12-16 MED ORDER — IRBESARTAN 150 MG PO TABS
150.0000 mg | ORAL_TABLET | Freq: Every day | ORAL | Status: DC
  Administered 2011-12-17 – 2011-12-20 (×4): 150 mg via ORAL
  Filled 2011-12-16 (×4): qty 1

## 2011-12-16 MED ORDER — CEFAZOLIN SODIUM-DEXTROSE 2-3 GM-% IV SOLR
2.0000 g | INTRAVENOUS | Status: DC
  Filled 2011-12-16: qty 50

## 2011-12-16 NOTE — CV Procedure (Signed)
Preop DX:: Post op DX:: same  Procedure single pacemaker implantation  After routine prep and drape, lidocaine was infiltrated in the prepectoral subclavicular region on the left side an incision was made and carried down to later the prepectoral fascia using electrocautery and sharp dissection a pocket was formed similarly. Hemostasis was obtained.  After this, we turned our attention to gaining accessm to the extrathoracic,left subclavian vein. This was accomplished without difficulty and without the aspiration of air or puncture of the artery. 2 separate venipunctures were accomplished; guidewires were placed and retained and sequentially 7 French sheath through which were  passed an Medtronic 5076  ventricular lead serial number [id number]  .  The ventricular lead was manipulated to the right ventricular apex with a bipolar R wave was 8.0, the pacing impedance was 1062 , the threshold was 1.5 @ 0.5 msec  Current at threshold was   1.5   Ma and the current of injury was  Brisk .   The leads were affixed to the prepectoral fascia and attached to a  Medtronic sensis pulse generator serial number [id number].  Hemostasis was obtained. The pocket was copiously irrigated with antibiotic containing saline solution. The lead  and the pulse generator were placed in the pocket and affixed to the prepectoral fascia. The wound was then closed in 3 layers in the normal fashion. The wound was washed dried and a benzoin Steri-Strip dressing was applied.  Needle  Count, sponge counts and instrument counts were correct at the end of the procedure .   The patient tolerated the procedure without apparent complication.  Gerlene Burdock.D.

## 2011-12-16 NOTE — Consult Note (Signed)
ELECTROPHYSIOLOGY CONSULT NOTE  Patient ID: [name] MRN: [id number], DOB/AGE: 1924-04-25   Admit date: 12/13/2011 Date of Consult: 12/16/2011  Primary Physician: Karleen Hampshire, MD  Primary Cardiologist: Dietrich Pates, MD Reason for Consultation: Bradycardia  History of Present Illness [name] is an 78 year old man with mild dementia, chronic atrial fibrillation on Coumadin, known chronic bradycardia, rheumatic heart disease with severe MS, uncontrolled HTN, PVD and mesenteric ischemia s/p SMA embolectomy who has been admitted with weakness. He was recently discharged from [facility] with similar symptoms. It was not clear during his [facility] admission on 12/06/11 that his bradycardia was in fact related to his weakness, and the decision was made to hold off on pacemaker. He was last seen in the office 06/2011 by Joni Reining, PA at which time his bradycardia was assessed. He wore a cardiac monitor showing a 3.2 second pause; however, he was not having syncopal episodes at that time and was able to remain active, thus the decision was made to hold off on EP eval/pacemaker. A walking rhythm strip in the office at that time showed HR 40-44. He also had a normal TSH at that time. On the day of admission, [name] reports a syncopal episode while sitting at rest at home. This was witnessed by a friend who reports he "slumped over." [name] does not recall anything about the episode and there are no family members or friends present to assist with history questions. [name] denies any trouble with CP, SOB, palpitations or dizziness; although his admission H&P states that his son reported more dizziness after he was discharged this most recent time, since the addition of amlodipine and hydralazine.    Past Medical History Past Medical History  Diagnosis Date  . Rheumatic heart disease     moderate mitral stenosis and history of CHF  . Hypertension   . Sick sinus syndrome      Paroxysmal  and then constant AF; slow rate on modest AV nodal blocking agents  . Chronic low back pain   . Atrial fibrillation     Per record has Afib/Aflutter with SSS  . PVD (peripheral vascular disease)     Right superficial femoral to peroneal artery bypass graft with nonreversed saphenous vein composite  2002  . Mesenteric ischemia     SMA artery embolectomy in 2002  . Dementia     mild/H&P (12/13/2011)  . Prostate cancer   . Shortness of breath     "@ night" (12/13/2011)    Past Surgical History Past Surgical History  Procedure Date  . Transurethral resection of prostate   . Embolectomy 2002    SMA artery  . Femoral-peroneal bypass graft 2002    right superficial (12/13/2011)     Allergies/Intolerances No Known Allergies  Inpatient Medications . amLODipine  10 mg Oral Daily  . feeding supplement  237 mL Oral BID BM  . hydrochlorothiazide  25 mg Oral Daily  . irbesartan  300 mg Oral Daily  . iron polysaccharides  150 mg Oral Daily  . sodium chloride  3 mL Intravenous Q12H  . DISCONTD: enoxaparin (LOVENOX) injection  50 mg Subcutaneous Q12H  . DISCONTD: Warfarin - Pharmacist Dosing Inpatient   Does not apply q1800   Family History History reviewed. No pertinent family history.   Social History Social History  . Marital Status: Widowed   Occupational History  . retired     yard work Materials engineer   Social History Main Topics  .  Smoking status: Never Smoker   . Smokeless tobacco: Never Used  . Alcohol Use: No  . Drug Use: No   Review of Systems General: No chills, fever, night sweats or weight changes  Cardiovascular: No chest pain, dyspnea on exertion, edema, orthopnea, palpitations, paroxysmal nocturnal dyspnea Dermatological: No rash, lesions or masses Respiratory: No cough, dyspnea Urologic: No hematuria, dysuria Abdominal: No nausea, vomiting, diarrhea, bright red blood per rectum, melena, or hematemesis Neurologic: No visual changes, difficult mentation or  difficulty moving extremities All other systems reviewed and are otherwise negative except as noted above.  Physical Exam Blood pressure 164/87, pulse 49, temperature 98.1 F (36.7 C), temperature source Oral, resp. rate 18, height 5\' 6"  (1.676 m), weight 114 lb 10.2 oz (52 kg), SpO2 96.00%.  General: Thin, elderly appearing 78 year old male in no acute distress. HEENT: Normocephalic, atraumatic. EOMs intact. Sclera nonicteric. Oropharynx clear.  Neck: Supple without bruits. No JVD. Lungs: Respirations regular and unlabored, CTA bilaterally. No wheezes, rales or rhonchi. Heart: Bradycardic, irregular. S1, S2 present with split S2. Diastolic murmur heard at apex. No rub, S3 or S4. Abdomen: Soft, non-tender, non-distended. BS present x 4 quadrants. No hepatosplenomegaly.  Extremities: No clubbing, cyanosis or edema. DP/PT/Radials 2+ and equal bilaterally. Psych: Normal affect. Neuro: Alert and oriented X 3. Moves all extremities spontaneously. Musculoskeletal: No kyphosis. Skin: Intact. Warm and dry. No rashes or petechiae in exposed areas.   Labs  Basename 12/13/11 1340  CKTOTAL --  CKMB --  TROPONINI <0.30   Lab Results  Component Value Date   WBC 5.1 12/13/2011   HGB 13.2 12/13/2011   HCT 39.6 12/13/2011   MCV 85.2 12/13/2011   PLT 164 12/13/2011    Lab 12/14/11 0619 12/13/11 1340  NA 136 --  K 4.2 --  CL 103 --  CO2 24 --  BUN 20 --  CREATININE 0.96 --  CALCIUM 9.1 --  PROT -- 7.8  BILITOT -- 0.5  ALKPHOS -- 94  ALT -- 22  AST -- 34  GLUCOSE 86 --   No components found with this basename: MAGNESIUM No components found with this basename: POCBNP:3 No results found for this basename: TSH,T4TOTAL,FREET3,T3FREE,THYROIDAB in the last 72 hours   Basename 12/16/11 0626  INR 2.25*    Radiology/Studies Ct Head Wo Contrast 12/13/2011  *RADIOLOGY REPORT*  Clinical Data: Syncope.  CT HEAD WITHOUT CONTRAST  Technique:  Contiguous axial images were obtained from the base of  the skull through the vertex without contrast.  Comparison: 12/06/2011.  Findings: No evidence of acute infarct, acute hemorrhage, mass lesion, mass effect or hydrocephalus.  There may be a small remote lacunar infarct in the left basal ganglia.  Atrophy. Periventricular low attenuation.  Opacification of a single right ethmoid air cell.  Visualized portions of the paranasal sinuses and mastoid air cells are otherwise clear.  IMPRESSION:  1.  No acute intracranial abnormality. 2.  Atrophy and chronic microvascular white matter ischemic changes.   Original Report Authenticated By: Reyes Ivan, M.D.    Dg Chest Port 1 View 12/13/2011  *RADIOLOGY REPORT*  Clinical Data: Chest pain.  PORTABLE CHEST - 1 VIEW  Comparison: 12/06/2011.  Findings: Trachea is midline.  Heart is enlarged as are the pulmonary arteries.  Left atrial appendage appears prominent. Lungs are emphysematous with scarring at the apices.  There may be tiny bilateral pleural effusions.  IMPRESSION:  1.  Emphysema with biapical pleural parenchymal scarring. 2.  Cardiac enlargement with pulmonary arterial hypertension. 3.  Question tiny bilateral pleural effusions.   Original Report Authenticated By: Reyes Ivan, M.D.    Echocardiogram 12/14/2011 - Left ventricle: The cavity size was normal. Wall thickness was normal. Systolic function was normal. The estimated ejection fraction was in the range of 55% to 60%. Wall motion was normal; there were no regional wall motion abnormalities. - Aortic valve: Mild regurgitation. - Mitral valve: Calcified annulus. Mild thickening, consistent with rheumatic disease. The findings are consistent with severe stenosis. Mild regurgitation. Valve area by pressure half-time: 1.02cm^2. - Left atrium: The atrium was severely dilated. Spontaneous contrast and probable thrombus noted in left atrium. - Right ventricle: The cavity size was mildly dilated. - Right atrium: The atrium was severely dilated. -  Pulmonary arteries: Systolic pressure was moderately to severely increased. PA peak pressure: 60mm Hg (S). - Pericardium, extracardiac: A trivial pericardial effusion was identified.  12-lead ECG 12/13/2011 - shows AFib at 63 bpm 12-lead ECG 12/14/2011 - shows AFib at 54 bpm Telemetry shows chronic AFib with rates from low 40s to 70s  Assessment and Plan 1. Bradycardia 2. Chronic AFib 3. Rheumatic heart disease with severe MS 4. Probable LA thrombus 5. Pulmonary hypertension [name] has known bradycardia that has been followed as an outpatient. In the past, his symptoms have not clearly been correlated to his bradycardia therefore, PPM was not indicated. However, he now presents with a new symptom of syncope. He does not recall any details of the episode. While here, his rates have fallen into the 40s. Of note, in the office in April his walking heart rate was also in the 40s. He has chronotropic incompetence and now in the setting of unexplained syncope, he may benefit from VVI PPM implantation (class IIa indication).  Dr. Graciela Husbands to see and make further recommendations. Signed, Rick Duff, PA-C 12/16/2011, 8:27 AM  Pt with CAF and mitral stenosis, severe by valve area and with 2/2 (presumed) PulmHtn and chronically slow rates w an episode of abrupt onset syncope.  He has had dizziness following uptitration of antihypertensives.  Have spoken with Dr Macarthur Critchley who encourages pacing which I think is a reasonable 2a indication for syncope with documented bradycardia (apuses>3 sec documented) although the immediate association is not clear.  Have reviewed risks and benefits with pt who agrees to proceed

## 2011-12-16 NOTE — Progress Notes (Signed)
Pt ambulated with walker to opposite end of hall, about 130ft.  HR remained 70-80.  No c/o SOB, dizziness, weakness.  States he does not use any type of assistive device at home when walking.  Tolerated well.

## 2011-12-17 ENCOUNTER — Inpatient Hospital Stay (HOSPITAL_COMMUNITY): Payer: Medicare Other

## 2011-12-17 ENCOUNTER — Encounter: Payer: Self-pay | Admitting: *Deleted

## 2011-12-17 DIAGNOSIS — Z950 Presence of cardiac pacemaker: Secondary | ICD-10-CM | POA: Insufficient documentation

## 2011-12-17 MED ORDER — WARFARIN - PHARMACIST DOSING INPATIENT: Freq: Every day | Status: DC

## 2011-12-17 MED ORDER — ENOXAPARIN SODIUM 60 MG/0.6ML ~~LOC~~ SOLN
1.0000 mg/kg | Freq: Two times a day (BID) | SUBCUTANEOUS | Status: DC
  Administered 2011-12-17: 50 mg via SUBCUTANEOUS
  Administered 2011-12-17: 13:00:00 via SUBCUTANEOUS
  Administered 2011-12-18 – 2011-12-19 (×4): 50 mg via SUBCUTANEOUS
  Filled 2011-12-17 (×8): qty 0.6

## 2011-12-17 MED ORDER — OXYCODONE-ACETAMINOPHEN 5-325 MG PO TABS
1.0000 | ORAL_TABLET | Freq: Four times a day (QID) | ORAL | Status: DC | PRN
  Administered 2011-12-17 (×2): 1 via ORAL
  Filled 2011-12-17 (×2): qty 1

## 2011-12-17 MED ORDER — WARFARIN SODIUM 5 MG PO TABS
5.0000 mg | ORAL_TABLET | Freq: Once | ORAL | Status: AC
  Administered 2011-12-17: 5 mg via ORAL
  Filled 2011-12-17: qty 1

## 2011-12-17 NOTE — Discharge Summary (Signed)
ELECTROPHYSIOLOGY DISCHARGE SUMMARY    Patient ID: [name],  MRN: [id number], DOB/AGE: August 22, 1924 78 y.o.  Admit date: 12/13/2011 Discharge date: 12/20/2011  Primary Care Physician: Karleen Hampshire, MD  Primary Cardiologist: Dietrich Pates, MD  Primary Discharge Diagnosis:  1. Syncope 2. Bradycardia s/p PPM implantation 3. Rheumatic valvular disease with severe mitral stenosis 4. Probably LA thrombus  Secondary Discharge Diagnoses:  1. Chronic AFib 2. Pulmonary hypertension 3. PVD 4. HTN 5. Dementia 6. Mesenteric ischemia s/p SMA embolectomy 7. History of prostate CA  Procedures This Admission:  1. Echocardiogram 12/14/2011  - Left ventricle: The cavity size was normal. Wall thickness was normal. Systolic function was normal. The estimated ejection fraction was in the range of 55% to 60%. Wall motion was normal; there were no regional wall motion abnormalities. - Aortic valve: Mild regurgitation. - Mitral valve: Calcified annulus. Mild thickening, consistent with rheumatic disease. The findings are consistent with severe stenosis. Mild regurgitation. Valve area by pressure half-time: 1.02cm^2. - Left atrium: The atrium was severely dilated. Spontaneous contrast and probable thrombus noted in left atrium. - Right ventricle: The cavity size was mildly dilated. - Right atrium: The atrium was severely dilated. - Pulmonary arteries: Systolic pressure was moderately to severely increased. PA peak pressure: 60mm Hg (S). - Pericardium, extracardiac: A trivial pericardial effusion was identified. 2. Single chamber PPM implantation 12/16/2011 Medtronic 5076 ventricular lead serial number [id number]. Medtronic Sensia pulse generator serial number [id number].  History and Hospital Course:  [name] is an 78 year old man with mild dementia, chronic atrial fibrillation on Coumadin, known chronic bradycardia, rheumatic heart disease with severe MS, uncontrolled HTN, PVD and mesenteric  ischemia s/p SMA embolectomy who was admitted with weakness. He was recently discharged from [facility] with similar symptoms. It was not clear during his [facility] admission on 12/06/11 that his bradycardia was in fact related to his weakness, and the decision was made to hold off on pacemaker. He was last seen in the office 06/2011 by Joni Reining, NP at which time his bradycardia was assessed. He wore a cardiac monitor showing a 3.2 second pause; however, he was not having syncopal episodes at that time and was able to remain active, thus the decision was made to hold off on EP eval/pacemaker. A walking rhythm strip in the office at that time showed HR 40-44. He also had a normal TSH at that time. On the day of admission, [name] experienced a syncopal episode while sitting at rest at home. This was witnessed by a friend who reports he "just slumped over." Mr. Soth does not recall anything about the episode and he did not injure himself. He denied CP, SOB, palpitations or dizziness. His son on admission reported his dad seemed more "dizzy" with uptitration of his antihypertensive medications at his last hospital discharge. He was seen in consultation by Dr. Graciela Husbands who discussed his recommendations with Dr. Dietrich Pates, his primary cardiologist.   [name] has known bradycardia that has been followed as an outpatient. In the past, his symptoms have not clearly been correlated to his bradycardia therefore, PPM was not indicated. However, he now presents with a new symptom of syncope. He does not recall any details of the episode. While here, his rates have fallen into the 40s. Of note, in the office in April his walking heart rate was also in the 40s. He has chronotropic incompetence and now in the setting of unexplained syncope, he may benefit from VVI PPM implantation (class IIa indication). This  was discussed in detail with the patient and his family (son and niece) who agreed to proceed with PPM implantation.  This was done  12/16/2011. [name] tolerated this procedure well without any immediate complication. He remains hemodynamically stable and afebrile. His chest xray shows stable lead placement without pneumothorax. His device interrogation has been reviewed by Dr. Graciela Husbands and shows normal PPM function with stable lead parameters/measurements. His implant site is intact without significant bleeding or hematoma. His coumadin was resumed post-pacer placement and b/c of his hisory of afib and mitral stenosis, decision was made to keep him in the hospital, on lovenox bridging until his INR became therapeutic, which it has this AM.  He will be d/c today in good condition.  He and his family have been given discharge instructions including wound care and activity restrictions. He will follow-up in 10 days for wound check. He will return to clinic as scheduled next week for Coumadin follow-up. He will see Dr. Dietrich Pates in 2-3 weeks for follow-up regarding rheumatic valvular disease with severe MS.  Discharge Vitals: Blood pressure 128/72, pulse 59, temperature 97.9 F (36.6 C), temperature source Oral, resp. rate 18, height 5\' 6"  (1.676 m), weight 110 lb (49.896 kg), SpO2 99.00%.   Labs: Lab Results  Component Value Date   WBC 3.6* 12/20/2011   HGB 12.0* 12/20/2011   HCT 35.6* 12/20/2011   MCV 83.4 12/20/2011   PLT 185 12/20/2011     Lab 12/14/11 0619 12/13/11 1340  NA 136 --  K 4.2 --  CL 103 --  CO2 24 --  BUN 20 --  CREATININE 0.96 --  CALCIUM 9.1 --  PROT -- 7.8  BILITOT -- 0.5  ALKPHOS -- 94  ALT -- 22  AST -- 34  GLUCOSE 86 --    Basename 12/20/11 0520  INR 2.29*    Disposition:  The patient is being discharged in stable condition.  Follow-up:     Follow-up Information    Follow up with [facility] On 12/26/2011. (At 11:30 AM for wound check)    Contact information:   [facility] - [place] [address] [place]  [address] [phone]      Follow up with Sherryl Manges, MD. In 3 months. (Our office will call you to schedule this appointment )    Contact information:   [facility] [place] [address] [place] [address] [phone]      Follow up with [place] Bing, MD. On 01/13/2012. (At 11:30 AM)    Contact information:   [facility] [place] [address] [place] [address] [phone]      Follow up with [facility] [place]. On 12/25/2011. (At 2:10 PM for Coumadin follow-up (INR check))    Contact information:   [facility] [place] [address] [place] [address] [phone]       Discharge Medications:    Medication List     As of 12/20/2011  8:57 AM    STOP taking these medications         hydrALAZINE 50 MG tablet   Commonly known as: APRESOLINE      TAKE these medications         amLODipine 2.5 MG tablet   Commonly known as: NORVASC   Take 1 tablet (2.5 mg total) by mouth daily.      hydrochlorothiazide 25 MG tablet   Commonly known as: HYDRODIURIL  Take 25 mg by mouth daily.      iron polysaccharides 150 MG capsule   Commonly known as: NIFEREX   Take 150 mg by mouth daily.      olmesartan 40 MG tablet   Commonly known as: BENICAR   Take 1 tablet (40 mg total) by mouth daily.      warfarin 5 MG tablet   Commonly known as: COUMADIN   Take 0.5-1 tablets (2.5-5 mg total) by mouth daily. Take coumadin 1 tablet daily except 1/2 tablet on Mondays and Thursdays.      zolpidem 5 MG tablet   Commonly known as: AMBIEN   Take 2.5 mg by mouth at bedtime as needed.        Duration of Discharge Encounter: Greater than 30 minutes including physician time.  Signed, Nicolasa Ducking, NP 12/20/2011, 8:57 AM

## 2011-12-17 NOTE — Progress Notes (Signed)
     Patient: [name] Date of Encounter: 12/17/2011, 8:04 AM Admit date: 12/13/2011     Subjective  Mr. Mccauslin denies any complaints this AM. He is s/p VVI PPM implantation.   Objective  Physical Exam: Vitals: BP 123/62  Pulse 50  Temp 97.4 F (36.3 C) (Oral)  Resp 18  Ht 5\' 6"  (1.676 m)  Wt 111 lb (50.349 kg)  BMI 17.92 kg/m2  SpO2 100% General: Well developed, thin, elderly appearing 78 year old male in no acute distress. Neck: Supple. JVD not elevated. Lungs: Clear bilaterally to auscultation without wheezes, rales, or rhonchi. Breathing is unlabored. Heart: Irregular S1 S2 with diastolic murmur at apex. No rub or gallop.  Abdomen: Soft, non-distended. Extremities: No clubbing or cyanosis. No edema.  Distal pedal pulses are 2+ and equal bilaterally. Neuro: Alert. Moves all extremities spontaneously. No focal deficits. Skin: Left upper chest implant site intact without significant bleeding or hematomoa.  Intake/Output: Intake/Output Summary (Last 24 hours) at 12/17/11 0804 Last data filed at 12/16/11 1306  Gross per 24 hour  Intake      0 ml  Output    100 ml  Net   -100 ml   Inpatient Medications:  . amLODipine  2.5 mg Oral Daily  . ceFAZolin      .  ceFAZolin (ANCEF) IV  1 g Intravenous Q6H  .  ceFAZolin (ANCEF) IV  2 g Intravenous Once  . feeding supplement  237 mL Oral BID BM  . gentamicin irrigation   Irrigation Once  . heparin      . hydrochlorothiazide  25 mg Oral Daily  . irbesartan  150 mg Oral Daily  . iron polysaccharides  150 mg Oral Daily  . lidocaine      . sodium chloride  3 mL Intravenous Q12H  . DISCONTD: amLODipine  10 mg Oral Daily  . DISCONTD:  ceFAZolin (ANCEF) IV  2 g Intravenous On Call  . DISCONTD: chlorhexidine  60 mL Topical Once  . DISCONTD: chlorhexidine  60 mL Topical Once  . DISCONTD: gentamicin irrigation  80 mg Irrigation On Call  . DISCONTD: irbesartan  300 mg Oral Daily  . DISCONTD: sodium chloride  3 mL Intravenous  Q12H   Labs: None in last 12 hours INR pending this AM  Radiology/Studies: Chest x-ray this AM done, report pending Telemetry: atrial fibrillation, rate in the upper 40s with intermittent V pacing Device interrogation: reviewed and shows normal VVI PPM function with stable lead measurements   Assessment and Plan  1. Bradycardia with syncope - he is doing well s/p VVI PPM yesterday; awaiting CXR results; device check is normal; implant site without significant bleeding or hematoma; plan for DC home today if CXR shows stable lead placement without PTX 2. Chronic AFib 3. Rheumatic valvular disease, severe MS 4. HTN  Dr. Graciela Husbands to see and make further recommendations Signed, EDMISTEN, BROOKE PA-C  Anticipate discharge following predischarge planning with fu with Dr Macarthur Critchley for MS although his dementia may be preclusive.

## 2011-12-17 NOTE — Progress Notes (Signed)
ANTICOAGULATION CONSULT NOTE - Follow Up Consult  Pharmacy Consult for Coumadin/Lovenox Indication: atrial fibrillation  No Known Allergies  Patient Measurements: Height: 5\' 6"  (167.6 cm) Weight: 111 lb (50.349 kg) IBW/kg (Calculated) : 63.8   Vital Signs: Temp: 97.4 F (36.3 C) (10/01 0500) Temp src: Oral (10/01 0500) BP: 123/62 mmHg (10/01 0500) Pulse Rate: 50  (10/01 0500)  Labs:  Basename 12/17/11 0710 12/16/11 0626 12/15/11 0630  HGB -- -- --  HCT -- -- --  PLT -- -- --  APTT -- -- --  LABPROT 18.5* 23.9* 28.3*  INR 1.59* 2.25* 2.83*  HEPARINUNFRC -- -- --  CREATININE -- -- --  CKTOTAL -- -- --  CKMB -- -- --  TROPONINI -- -- --    Estimated Creatinine Clearance: 40.8 ml/min (by C-G formula based on Cr of 0.96).   Medications:  Scheduled:     . amLODipine  2.5 mg Oral Daily  . ceFAZolin      .  ceFAZolin (ANCEF) IV  1 g Intravenous Q6H  .  ceFAZolin (ANCEF) IV  2 g Intravenous Once  . feeding supplement  237 mL Oral BID BM  . gentamicin irrigation   Irrigation Once  . heparin      . hydrochlorothiazide  25 mg Oral Daily  . irbesartan  150 mg Oral Daily  . iron polysaccharides  150 mg Oral Daily  . lidocaine      . sodium chloride  3 mL Intravenous Q12H  . DISCONTD: amLODipine  10 mg Oral Daily  . DISCONTD:  ceFAZolin (ANCEF) IV  2 g Intravenous On Call  . DISCONTD: chlorhexidine  60 mL Topical Once  . DISCONTD: chlorhexidine  60 mL Topical Once  . DISCONTD: gentamicin irrigation  80 mg Irrigation On Call  . DISCONTD: irbesartan  300 mg Oral Daily  . DISCONTD: sodium chloride  3 mL Intravenous Q12H    Assessment: 78 year old man s/p PPM implantation on Coumadin and Lovenox for atrial fib.  Home regimen is 2.5mg  everyday, except for 5mg  on Tuesdays and Fridays.  INR today=1.59, subtherapeutic.    Goal of Therapy:  INR 2-3 Monitor platelets by anticoagulation protocol: Yes   Plan:  Start lovenox 50mg  SQ q 12hrs  Coumadin 5mg  today at  1800 Daily PT/INR  Wendie Simmer, PharmD, BCPS Clinical Pharmacist  Pager: [phone]

## 2011-12-17 NOTE — Care Management Note (Signed)
    Page 1 of 1   12/17/2011     2:00:09 PM   CARE MANAGEMENT NOTE 12/17/2011  Patient:  [name], [name]   Account Number:  [id number]  Date Initiated:  12/17/2011  Documentation initiated by:  GRAVES-BIGELOW,Antawn Sison  Subjective/Objective Assessment:   Pt admitted with syncope. S/p pacemaker placement. Pt has family support and 24 hr supervision.     Action/Plan:   Family is agreeable to [facility] services. HHRN and Aide. CM did make referral for services. SOC to begin within 24-48 hrs post d/c.   Anticipated DC Date:  12/18/2011   Anticipated DC Plan:  HOME W HOME HEALTH SERVICES      DC Planning Services  CM consult      [facility] Choice  HOME HEALTH   Choice offered to / List presented to:  C-4 Adult Children        HH arranged  HH-1 RN  HH-10 DISEASE MANAGEMENT  HH-4 NURSE'S AIDE      HH agency  [facility]   Status of service:  Completed, signed off Medicare Important Message given?   (If response is "NO", the following Medicare IM given date fields will be blank) Date Medicare IM given:   Date Additional Medicare IM given:    Discharge Disposition:  HOME W HOME HEALTH SERVICES  Per UR Regulation:  Reviewed for med. necessity/level of care/duration of stay  If discussed at Long Length of Stay Meetings, dates discussed:    Comments:

## 2011-12-17 NOTE — Progress Notes (Signed)
UR Completed Zahniya Zellars Graves-Bigelow, RN,BSN [phone]  

## 2011-12-18 LAB — PROTIME-INR: INR: 1.62 — ABNORMAL HIGH (ref 0.00–1.49)

## 2011-12-18 MED ORDER — TRAVOPROST (BAK FREE) 0.004 % OP SOLN
1.0000 [drp] | Freq: Every day | OPHTHALMIC | Status: DC
  Administered 2011-12-18 – 2011-12-19 (×2): 1 [drp] via OPHTHALMIC
  Filled 2011-12-18: qty 2.5

## 2011-12-18 MED ORDER — WARFARIN VIDEO
Freq: Once | Status: AC
  Administered 2011-12-18: 15:00:00

## 2011-12-18 MED ORDER — WARFARIN SODIUM 5 MG PO TABS
5.0000 mg | ORAL_TABLET | Freq: Once | ORAL | Status: AC
  Administered 2011-12-18: 5 mg via ORAL
  Filled 2011-12-18: qty 1

## 2011-12-18 NOTE — Progress Notes (Signed)
ANTICOAGULATION CONSULT NOTE - Follow Up Consult  Pharmacy Consult for Coumadin/Lovenox Indication: atrial fibrillation  No Known Allergies  Patient Measurements: Height: 5\' 6"  (167.6 cm) Weight: 113 lb (51.256 kg) IBW/kg (Calculated) : 63.8   Vital Signs: Temp: 97.8 F (36.6 C) (10/02 0500) Temp src: Oral (10/02 0500) BP: 130/81 mmHg (10/02 0830) Pulse Rate: 54  (10/02 0830)  Labs:  Basename 12/18/11 0550 12/17/11 0710 12/16/11 0626  HGB -- -- --  HCT -- -- --  PLT -- -- --  APTT -- -- --  LABPROT 18.7* 18.5* 23.9*  INR 1.62* 1.59* 2.25*  HEPARINUNFRC -- -- --  CREATININE -- -- --  CKTOTAL -- -- --  CKMB -- -- --  TROPONINI -- -- --    Estimated Creatinine Clearance: 41.6 ml/min (by C-G formula based on Cr of 0.96).   Medications:  Scheduled:     . amLODipine  2.5 mg Oral Daily  .  ceFAZolin (ANCEF) IV  2 g Intravenous Once  . enoxaparin (LOVENOX) injection  1 mg/kg Subcutaneous Q12H  . feeding supplement  237 mL Oral BID BM  . gentamicin irrigation   Irrigation Once  . hydrochlorothiazide  25 mg Oral Daily  . irbesartan  150 mg Oral Daily  . iron polysaccharides  150 mg Oral Daily  . sodium chloride  3 mL Intravenous Q12H  . warfarin  5 mg Oral ONCE-1800  . Warfarin - Pharmacist Dosing Inpatient   Does not apply q1800    Assessment: 78 year old man s/p PPM implantation on Coumadin and Lovenox for atrial fib.  Home regimen is 2.5mg  everyday, except for 5mg  on Tuesdays and Fridays.  INR today=1.62, sub-therapeutic but increased after one dose.    **Note that Coumadin was held from 9/28 to 9/30 due to possibility of pacemaker-> resumed 10/1.   Goal of Therapy:  INR 2-3 Monitor platelets by anticoagulation protocol: Yes   Plan:  Continue lovenox 50mg  SQ q 12hrs  Coumadin 5mg  again today at 1800 Daily PT/INR  Link Snuffer, PharmD, BCPS Clinical Pharmacist [phone] 12/18/2011, 10:01 AM

## 2011-12-18 NOTE — Progress Notes (Signed)
  Patient Name: [name]      SUBJECTIVE without complaint  Past Medical History  Diagnosis Date  . Rheumatic heart disease     moderate mitral stenosis and history of CHF  . Hypertension   . Sick sinus syndrome      Paroxysmal and then constant AF; slow rate on modest AV nodal blocking agents  . Chronic low back pain   . Atrial fibrillation     Per record has Afib/Aflutter with SSS  . PVD (peripheral vascular disease)     Right superficial femoral to peroneal artery bypass graft with nonreversed saphenous vein composite  2002  . Mesenteric ischemia     SMA artery embolectomy in 2002  . Dementia     mild/H&P (12/13/2011)  . Prostate cancer   . Shortness of breath     "@ night" (12/13/2011)    PHYSICAL EXAM Filed Vitals:   12/17/11 1408 12/17/11 2100 12/18/11 0500 12/18/11 0830  BP: 137/70 128/44 122/74 130/81  Pulse: 50 59 48 54  Temp: 97.7 F (36.5 C) 98.1 F (36.7 C) 97.8 F (36.6 C)   TempSrc:  Oral Oral   Resp: 20 18    Height:      Weight:   113 lb (51.256 kg)   SpO2: 98% 100% 99%     Well developed and nourished in no acute distress HENT normal Neck supple with JVP-flat Carotids brisk and full without bruits Clear Irregularly irregular rate and rhythm with  2/6 diastolic murmur Abd-soft with active BS without hepatomegaly No Clubbing cyanosis edema Skin-warm and dry A & Oriented  Grossly normal sensory and motor function  TELEMETRY: Reviewed telemetry pt in afib with occ pacing    Intake/Output Summary (Last 24 hours) at 12/18/11 1041 Last data filed at 12/18/11 0627  Gross per 24 hour  Intake      0 ml  Output   1100 ml  Net  -1100 ml    LABS: Basic Metabolic Panel:  Lab 12/14/11 1610 12/13/11 1340  NA 136 135  K 4.2 4.5  CL 103 100  CO2 24 27  GLUCOSE 86 97  BUN 20 26*  CREATININE 0.96 0.96  CALCIUM 9.1 9.7  MG 2.1 --  PHOS -- --   Cardiac Enzymes: No results found for this basename: CKTOTAL:3,CKMB:3,CKMBINDEX:3,TROPONINI:3  in the last 72 hours CBC:  Lab 12/13/11 1340  WBC 5.1  NEUTROABS 3.4  HGB 13.2  HCT 39.6  MCV 85.2  PLT 164   PROTIME:  Basename 12/18/11 0550 12/17/11 0710 12/16/11 0626  LABPROT 18.7* 18.5* 23.9*  INR 1.62* 1.59* 2.25*     ASSESSMENT AND PLAN:  Patient Active Hospital Problem List: Mitral stenosis (09/11/2009)   Atrial fibrillation (06/12/2010)   Symptomatic bradycardia (12/06/2011)   Syncope (12/06/2011)  * Dementia (12/06/2011)   S/p pacer with subtherapeutic INR with MS and Afib.  Will keep until INR is therapuetic  And begin low dose heparin    Signed, Sherryl Manges MD  12/18/2011

## 2011-12-19 DIAGNOSIS — I4891 Unspecified atrial fibrillation: Secondary | ICD-10-CM

## 2011-12-19 DIAGNOSIS — I059 Rheumatic mitral valve disease, unspecified: Secondary | ICD-10-CM

## 2011-12-19 LAB — PROTIME-INR
INR: 1.88 — ABNORMAL HIGH (ref 0.00–1.49)
Prothrombin Time: 20.9 seconds — ABNORMAL HIGH (ref 11.6–15.2)

## 2011-12-19 MED ORDER — WARFARIN SODIUM 7.5 MG PO TABS
7.5000 mg | ORAL_TABLET | Freq: Once | ORAL | Status: AC
  Administered 2011-12-19: 7.5 mg via ORAL
  Filled 2011-12-19: qty 1

## 2011-12-19 NOTE — Progress Notes (Signed)
  Patient Name: [name]      SUBJECTIVE without complaint  Past Medical History  Diagnosis Date  . Rheumatic heart disease     moderate mitral stenosis and history of CHF  . Hypertension   . Sick sinus syndrome      Paroxysmal and then constant AF; slow rate on modest AV nodal blocking agents  . Chronic low back pain   . Atrial fibrillation     Per record has Afib/Aflutter with SSS  . PVD (peripheral vascular disease)     Right superficial femoral to peroneal artery bypass graft with nonreversed saphenous vein composite  2002  . Mesenteric ischemia     SMA artery embolectomy in 2002  . Dementia     mild/H&P (12/13/2011)  . Prostate cancer   . Shortness of breath     "@ night" (12/13/2011)    PHYSICAL EXAM Filed Vitals:   12/18/11 0830 12/18/11 1352 12/18/11 2100 12/19/11 0500  BP: 130/81 128/86 135/58 146/89  Pulse: 54 50 51 51  Temp:  97.6 F (36.4 C) 98.1 F (36.7 C) 98.1 F (36.7 C)  TempSrc:   Oral Oral  Resp:  18    Height:      Weight:    113 lb (51.256 kg)  SpO2:  96% 100% 100%    Well developed and nourished in no acute distress HENT normal Neck supple with JVP-flat Carotids brisk and full without bruits Clear Irregularly irregular rate and rhythm with  2/6 diastolic murmur Abd-soft with active BS without hepatomegaly Condom catjh in place No Clubbing cyanosis edema Skin-warm and dry A & Oriented  Grossly normal sensory and motor function  TELEMETRY: Reviewed telemetry pt in afib with occ pacing    Intake/Output Summary (Last 24 hours) at 12/19/11 0714 Last data filed at 12/18/11 1800  Gross per 24 hour  Intake      0 ml  Output    800 ml  Net   -800 ml    LABS: Basic Metabolic Panel:  Lab 12/14/11 1610 12/13/11 1340  NA 136 135  K 4.2 4.5  CL 103 100  CO2 24 27  GLUCOSE 86 97  BUN 20 26*  CREATININE 0.96 0.96  CALCIUM 9.1 9.7  MG 2.1 --  PHOS -- --   Cardiac Enzymes: No results found for this basename:  CKTOTAL:3,CKMB:3,CKMBINDEX:3,TROPONINI:3 in the last 72 hours CBC:  Lab 12/13/11 1340  WBC 5.1  NEUTROABS 3.4  HGB 13.2  HCT 39.6  MCV 85.2  PLT 164   PROTIME:  Basename 12/18/11 0550 12/17/11 0710  LABPROT 18.7* 18.5*  INR 1.62* 1.59*     ASSESSMENT AND PLAN:  Patient Active Hospital Problem List: Mitral stenosis (09/11/2009)   Atrial fibrillation (06/12/2010)   Symptomatic bradycardia (12/06/2011)   Syncope (12/06/2011)  * Dementia (12/06/2011)   S/p pacer with subtherapeutic INR with MS and Afib.  Will keep until INR is therapuetic  And continue LMWH started two days ago  Await today INR  Signed, Sherryl Manges MD  12/19/2011

## 2011-12-19 NOTE — Progress Notes (Signed)
ANTICOAGULATION CONSULT NOTE - Follow Up Consult  Pharmacy Consult for Coumadin/Lovenox Indication: atrial fibrillation  No Known Allergies  Patient Measurements: Height: 5\' 6"  (167.6 cm) Weight: 113 lb (51.256 kg) IBW/kg (Calculated) : 63.8   Vital Signs: Temp: 98.1 F (36.7 C) (10/03 0500) Temp src: Oral (10/03 0500) BP: 146/89 mmHg (10/03 0500) Pulse Rate: 51  (10/03 0500)  Labs:  Alvira Philips 12/19/11 0605 12/18/11 0550 12/17/11 0710  HGB -- -- --  HCT -- -- --  PLT -- -- --  APTT -- -- --  LABPROT 20.9* 18.7* 18.5*  INR 1.88* 1.62* 1.59*  HEPARINUNFRC -- -- --  CREATININE -- -- --  CKTOTAL -- -- --  CKMB -- -- --  TROPONINI -- -- --    Estimated Creatinine Clearance: 41.6 ml/min (by C-G formula based on Cr of 0.96).   Medications:  Scheduled:     . amLODipine  2.5 mg Oral Daily  .  ceFAZolin (ANCEF) IV  2 g Intravenous Once  . enoxaparin (LOVENOX) injection  1 mg/kg Subcutaneous Q12H  . feeding supplement  237 mL Oral BID BM  . gentamicin irrigation   Irrigation Once  . hydrochlorothiazide  25 mg Oral Daily  . irbesartan  150 mg Oral Daily  . iron polysaccharides  150 mg Oral Daily  . sodium chloride  3 mL Intravenous Q12H  . Travoprost (BAK Free)  1 drop Both Eyes QHS  . warfarin  5 mg Oral ONCE-1800  . warfarin   Does not apply Once  . Warfarin - Pharmacist Dosing Inpatient   Does not apply q1800   Assessment: 78 year old man s/p PPM implantation on Coumadin and Lovenox for atrial fibrillation and arterial embolism and thrombosis. NR today=1.88, sub-therapeutic but increased after two doses of 5mg . Note that Coumadin was held from 9/28 to 9/30 due to possibility of pacemaker-> resumed 10/1. No recent CBC. No bleeding reported.   **Note that patient was sub-therapeutic on admission with an INR of 1.76.  Family reports that home dose was warfarin 2.5mg  daily except 5mg  on Tuesday and Fridays.  **Per [facility] notes patient was on 5mg   daily except 2.5mg  on Monday and Thursday. Last changed on 11/28/2011.   Goal of Therapy:  INR 3-3.5 per [facility]   Plan:  Continue lovenox 50mg  SQ q 12hrs  Increased dose of Coumadin 7.5mg   today at 1800- targeting goal INR of 3 per [facility].  If to go home, recommend increasing home dose 5mg  daily if goal INR to remain 3 to 3.5.  Daily PT/INR  Link Snuffer, PharmD, BCPS Clinical Pharmacist [phone] 12/19/2011, 10:13 AM

## 2011-12-19 NOTE — Progress Notes (Signed)
Nutrition Follow-up  Intervention:    Continue Ensure Complete twice daily (350 kcals, 13 gm protein per 8 fl oz bottle) RD to follow for nutrition care plan  Assessment:   Patient s/p pacemaker implantation 9/30. PO intake 100% per flowsheet records. Receiving Ensure Complete supplements twice daily. Awaiting therapeutic INR for discharge.  Diet Order:  Heart Healthy  Meds: Scheduled Meds:   . amLODipine  2.5 mg Oral Daily  .  ceFAZolin (ANCEF) IV  2 g Intravenous Once  . enoxaparin (LOVENOX) injection  1 mg/kg Subcutaneous Q12H  . feeding supplement  237 mL Oral BID BM  . gentamicin irrigation   Irrigation Once  . hydrochlorothiazide  25 mg Oral Daily  . irbesartan  150 mg Oral Daily  . iron polysaccharides  150 mg Oral Daily  . sodium chloride  3 mL Intravenous Q12H  . Travoprost (BAK Free)  1 drop Both Eyes QHS  . warfarin  5 mg Oral ONCE-1800  . warfarin  7.5 mg Oral ONCE-1800  . Warfarin - Pharmacist Dosing Inpatient   Does not apply q1800   Continuous Infusions:  PRN Meds:.sodium chloride, acetaminophen, acetaminophen, nitroGLYCERIN, ondansetron (ZOFRAN) IV, ondansetron (ZOFRAN) IV, oxyCODONE-acetaminophen, sodium chloride, zolpidem  Labs:  CMP     Component Value Date/Time   NA 136 12/14/2011 0619   K 4.2 12/14/2011 0619   CL 103 12/14/2011 0619   CO2 24 12/14/2011 0619   GLUCOSE 86 12/14/2011 0619   BUN 20 12/14/2011 0619   CREATININE 0.96 12/14/2011 0619   CREATININE 1.10 06/27/2011 1413   CALCIUM 9.1 12/14/2011 0619   PROT 7.8 12/13/2011 1340   ALBUMIN 3.6 12/13/2011 1340   AST 34 12/13/2011 1340   ALT 22 12/13/2011 1340   ALKPHOS 94 12/13/2011 1340   BILITOT 0.5 12/13/2011 1340   GFRNONAA 74* 12/14/2011 0619   GFRAA 86* 12/14/2011 0619     Intake/Output Summary (Last 24 hours) at 12/19/11 1545 Last data filed at 12/19/11 0815  Gross per 24 hour  Intake      0 ml  Output   1250 ml  Net  -1250 ml    Weight Status:  51.2 kg (10/3) -- stable  Estimated needs:   1300-1500 kcals, 60-75 gm protein  Nutrition Dx:  Inadequate Oral Intake, resolved  Goal:  Oral intake with meals & supplements to meet >/= 90% of estimated nutrition needs, met  Monitor:  PO & supplemental intake, weight, labs, I/O's  Kirkland Hun, RD, LDN Pager #: [phone] After-Hours Pager #: [phone]

## 2011-12-19 NOTE — Progress Notes (Signed)
[facility]  Patient Status: New  [facility] is providing the following services: SN  If patient discharges after hours, please call [phone].   [name] 12/19/2011, 11:31 AM

## 2011-12-20 LAB — CBC
HCT: 35.6 % — ABNORMAL LOW (ref 39.0–52.0)
MCHC: 33.7 g/dL (ref 30.0–36.0)
Platelets: 185 10*3/uL (ref 150–400)
RDW: 14.5 % (ref 11.5–15.5)

## 2011-12-20 LAB — PROTIME-INR
INR: 2.29 — ABNORMAL HIGH (ref 0.00–1.49)
Prothrombin Time: 24.2 seconds — ABNORMAL HIGH (ref 11.6–15.2)

## 2011-12-20 MED ORDER — AMLODIPINE BESYLATE 2.5 MG PO TABS: 2.5000 mg | ORAL_TABLET | Freq: Every day | ORAL | Status: DC

## 2011-12-20 MED ORDER — WARFARIN SODIUM 5 MG PO TABS: 2.5000 mg | ORAL_TABLET | Freq: Every day | ORAL | Status: DC

## 2011-12-20 MED ORDER — WARFARIN SODIUM 5 MG PO TABS
2.5000 mg | ORAL_TABLET | Freq: Every day | ORAL | Status: DC
Start: 2011-12-20 — End: 2012-12-14

## 2011-12-20 MED ORDER — WARFARIN SODIUM 5 MG PO TABS
5.0000 mg | ORAL_TABLET | Freq: Every day | ORAL | Status: DC
  Filled 2011-12-20: qty 1

## 2011-12-20 NOTE — Progress Notes (Signed)
ANTICOAGULATION CONSULT NOTE - Follow Up Consult  Pharmacy Consult for Coumadin/Lovenox Indication: atrial fibrillation  No Known Allergies  Patient Measurements: Height: 5\' 6"  (167.6 cm) Weight: 110 lb (49.896 kg) IBW/kg (Calculated) : 63.8   Vital Signs: Temp: 97.9 F (36.6 C) (10/04 0500) BP: 128/72 mmHg (10/04 0500) Pulse Rate: 59  (10/04 0500)  Labs:  Basename 12/20/11 0520 12/19/11 0605 12/18/11 0550  HGB 12.0* -- --  HCT 35.6* -- --  PLT 185 -- --  APTT -- -- --  LABPROT 24.2* 20.9* 18.7*  INR 2.29* 1.88* 1.62*  HEPARINUNFRC -- -- --  CREATININE -- -- --  CKTOTAL -- -- --  CKMB -- -- --  TROPONINI -- -- --    Estimated Creatinine Clearance: 40.4 ml/min (by C-G formula based on Cr of 0.96).   Medications:  Scheduled:     . amLODipine  2.5 mg Oral Daily  .  ceFAZolin (ANCEF) IV  2 g Intravenous Once  . enoxaparin (LOVENOX) injection  1 mg/kg Subcutaneous Q12H  . feeding supplement  237 mL Oral BID BM  . gentamicin irrigation   Irrigation Once  . hydrochlorothiazide  25 mg Oral Daily  . irbesartan  150 mg Oral Daily  . iron polysaccharides  150 mg Oral Daily  . sodium chloride  3 mL Intravenous Q12H  . Travoprost (BAK Free)  1 drop Both Eyes QHS  . warfarin  7.5 mg Oral ONCE-1800  . Warfarin - Pharmacist Dosing Inpatient   Does not apply q1800   Assessment: 78 year old man s/p PPM implantation on Coumadin and Lovenox for atrial fibrillation and arterial embolism and thrombosis.  INR today=2.29 after 5-5-7.5 mg doses.  Note that Coumadin was held from 9/28 to 9/30 due to possibility of pacemaker-> resumed 10/1. Hg 12 today, was 13.2 on 9/27.  No bleeding reported.   **Note that patient was sub-therapeutic on admission with an INR of 1.76.  Family reports that home dose was warfarin 2.5mg  daily except 5mg  on Tuesday and Fridays.  **Per [facility] notes patient was on 5mg  daily except 2.5mg  on Monday and Thursday. Last changed on  11/28/2011.   Goal of Therapy:  INR 3-3.5 per [facility]   Plan:  He is on  lovenox 50mg  SQ q 12hrs - plan to DC home today. Consider increasing home dose to  5mg  daily if goal INR to remain 3 to 3.5.  Herby Abraham, Pharm.D. [phone] 12/20/2011 8:40 AM

## 2011-12-20 NOTE — Progress Notes (Signed)
Patient ID: [name], male   DOB: [birth year], 78 y.o.   MRN: [id number]  Patient Name: [name]      SUBJECTIVE without complaint anxious to go home  Past Medical History  Diagnosis Date  . Rheumatic heart disease     moderate mitral stenosis and history of CHF  . Hypertension   . Sick sinus syndrome      Paroxysmal and then constant AF; slow rate on modest AV nodal blocking agents  . Chronic low back pain   . Atrial fibrillation     Per record has Afib/Aflutter with SSS  . PVD (peripheral vascular disease)     Right superficial femoral to peroneal artery bypass graft with nonreversed saphenous vein composite  2002  . Mesenteric ischemia     SMA artery embolectomy in 2002  . Dementia     mild/H&P (12/13/2011)  . Prostate cancer   . Shortness of breath     "@ night" (12/13/2011)    PHYSICAL EXAM Filed Vitals:   12/19/11 0500 12/19/11 1332 12/19/11 2100 12/20/11 0500  BP: 146/89 106/69 113/73 128/72  Pulse: 51 63 45 59  Temp: 98.1 F (36.7 C) 98.2 F (36.8 C) 98.4 F (36.9 C) 97.9 F (36.6 C)  TempSrc: Oral     Resp:  18 18 18   Height:      Weight: 113 lb (51.256 kg)   110 lb (49.896 kg)  SpO2: 100% 97% 98% 99%    Well developed and nourished in no acute distress HENT normal Neck supple with JVP-flat Carotids brisk and full without bruits Clear Irregularly irregular rate and rhythm with  2/6 diastolic murmur Abd-soft with active BS without hepatomegaly Condom catjh in place No Clubbing cyanosis edema Skin-warm and dry A & Oriented  Grossly normal sensory and motor function  TELEMETRY: Reviewed telemetry pt in afib with occ pacing    Intake/Output Summary (Last 24 hours) at 12/20/11 0801 Last data filed at 12/20/11 0600  Gross per 24 hour  Intake    240 ml  Output    950 ml  Net   -710 ml    LABS: Basic Metabolic Panel:  Lab 12/14/11 7829 12/13/11 1340  NA 136 135  K 4.2 4.5  CL 103 100  CO2 24 27  GLUCOSE 86 97  BUN 20 26*    CREATININE 0.96 0.96  CALCIUM 9.1 9.7  MG 2.1 --  PHOS -- --   Cardiac Enzymes: No results found for this basename: CKTOTAL:3,CKMB:3,CKMBINDEX:3,TROPONINI:3 in the last 72 hours CBC:  Lab 12/20/11 0520 12/13/11 1340  WBC 3.6* 5.1  NEUTROABS -- 3.4  HGB 12.0* 13.2  HCT 35.6* 39.6  MCV 83.4 85.2  PLT 185 164   PROTIME:  Basename 12/20/11 0520 12/19/11 0605 12/18/11 0550  LABPROT 24.2* 20.9* 18.7*  INR 2.29* 1.88* 1.62*     ASSESSMENT AND PLAN:  Patient Active Hospital Problem List: Mitral stenosis (09/11/2009)   Atrial fibrillation (06/12/2010)   Symptomatic bradycardia (12/06/2011)   Syncope (12/06/2011)  * Dementia (12/06/2011)   S/p pacer with therapeutic INR  2.29 this am  with MS and Afib.    D/C home  F/U Dr Dietrich Pates or Ladona Ridgel in [place]  F/U coumadin clinic next week    Signed, Charlton Haws MD  12/20/2011

## 2011-12-23 ENCOUNTER — Telehealth: Payer: Self-pay | Admitting: Internal Medicine

## 2011-12-23 NOTE — Telephone Encounter (Signed)
Will forward to Dr Graciela Husbands and his nurse for review and orders

## 2011-12-23 NOTE — Telephone Encounter (Signed)
New mess:  She would like orders for PT and home care aid.  Please call her with this information.

## 2011-12-23 NOTE — Telephone Encounter (Signed)
Thayer Ohm,   do you know how we can help with this following discharge. Thanks.

## 2011-12-24 ENCOUNTER — Telehealth: Payer: Self-pay | Admitting: *Deleted

## 2011-12-24 NOTE — Telephone Encounter (Signed)
Received a call from [facility] at [facility], reporting BP of 140/70 with HR in 50's and 60's.  States that PPM site is healing well without signs of infection.  Patient reports feeling better since implant.

## 2011-12-25 NOTE — Telephone Encounter (Signed)
Nursing is currently following this patient from [facility].  Message left for Dr Regino Schultze,  requesting an order for PT in the home, as well.

## 2011-12-25 NOTE — Telephone Encounter (Signed)
This pt is seen in [place] office and coumadin clinic in [place].  I discussed with Tomie in [place] office and she will follow up on this

## 2011-12-26 ENCOUNTER — Encounter: Payer: Medicare Other | Admitting: Physician Assistant

## 2011-12-26 ENCOUNTER — Ambulatory Visit (INDEPENDENT_AMBULATORY_CARE_PROVIDER_SITE_OTHER): Payer: Medicare Other | Admitting: *Deleted

## 2011-12-26 DIAGNOSIS — R001 Bradycardia, unspecified: Secondary | ICD-10-CM

## 2011-12-26 DIAGNOSIS — I4891 Unspecified atrial fibrillation: Secondary | ICD-10-CM

## 2011-12-26 DIAGNOSIS — Z7901 Long term (current) use of anticoagulants: Secondary | ICD-10-CM

## 2011-12-26 DIAGNOSIS — I498 Other specified cardiac arrhythmias: Secondary | ICD-10-CM

## 2011-12-26 LAB — POCT INR: INR: 2.3

## 2011-12-26 LAB — PACEMAKER DEVICE OBSERVATION
BRDY-0002RV: 45 {beats}/min
RV LEAD AMPLITUDE: 15.67 mv
RV LEAD IMPEDENCE PM: 590 Ohm

## 2011-12-26 NOTE — Progress Notes (Signed)
Wound check-PPM 

## 2012-01-01 ENCOUNTER — Encounter: Payer: Self-pay | Admitting: Internal Medicine

## 2012-01-13 ENCOUNTER — Ambulatory Visit: Payer: Medicare Other | Admitting: Cardiology

## 2012-01-23 ENCOUNTER — Ambulatory Visit (INDEPENDENT_AMBULATORY_CARE_PROVIDER_SITE_OTHER): Payer: Medicare Other | Admitting: *Deleted

## 2012-01-23 ENCOUNTER — Ambulatory Visit (INDEPENDENT_AMBULATORY_CARE_PROVIDER_SITE_OTHER): Payer: Medicare Other | Admitting: Adult Health

## 2012-01-23 ENCOUNTER — Encounter: Payer: Self-pay | Admitting: Adult Health

## 2012-01-23 VITALS — BP 146/64 | HR 48 | Ht 67.0 in | Wt 115.8 lb

## 2012-01-23 DIAGNOSIS — R001 Bradycardia, unspecified: Secondary | ICD-10-CM

## 2012-01-23 DIAGNOSIS — I4891 Unspecified atrial fibrillation: Secondary | ICD-10-CM

## 2012-01-23 DIAGNOSIS — Z7901 Long term (current) use of anticoagulants: Secondary | ICD-10-CM

## 2012-01-23 DIAGNOSIS — I498 Other specified cardiac arrhythmias: Secondary | ICD-10-CM

## 2012-01-23 DIAGNOSIS — I050 Rheumatic mitral stenosis: Secondary | ICD-10-CM

## 2012-01-23 LAB — POCT INR: INR: 5.1

## 2012-01-23 NOTE — Assessment & Plan Note (Signed)
He has murmur at the apex but does not have symptoms at this time. Will continue to watch him with follow up echo yearly.

## 2012-01-23 NOTE — Assessment & Plan Note (Signed)
Heart rate is well controlled and he remains compliant with coumadin regimen. He was seen in our coumadin clinic today as well. No complaints of bleeding.

## 2012-01-23 NOTE — Progress Notes (Signed)
   HPI: [name] is a pleasant 78 y/o patient of Dr. Dietrich Pates we are seeing on follow up after pacemaker implantation by Dr. Graciela Husbands on 12/13/2011 secondary to symptomatic bradycardia. He was taken off of HCTZ during his hospitalization. He also has a history of PAF, and is followed in our coumadin clinic.  He is without complaint today, has mild dementia and is accompanied by his son who says he is doing well at home, has home PT, and good energy. He offers no complaints of pacemaker site pain or swelling.  No Known Allergies  Current Outpatient Prescriptions  Medication Sig Dispense Refill  . amLODipine (NORVASC) 2.5 MG tablet Take 1 tablet (2.5 mg total) by mouth daily.  30 tablet  6  . iron polysaccharides (NIFEREX) 150 MG capsule Take 150 mg by mouth daily.      Marland Kitchen olmesartan (BENICAR) 40 MG tablet Take 1 tablet (40 mg total) by mouth daily.  30 tablet  6  . warfarin (COUMADIN) 5 MG tablet Take 0.5-1 tablets (2.5-5 mg total) by mouth daily. Take coumadin 1 tablet daily except 1/2 tablet on Mondays and Thursdays.      Marland Kitchen zolpidem (AMBIEN) 5 MG tablet Take 2.5 mg by mouth at bedtime as needed.        Past Medical History  Diagnosis Date  . Rheumatic heart disease     moderate mitral stenosis and history of CHF  . Hypertension   . Sick sinus syndrome      Paroxysmal and then constant AF; slow rate on modest AV nodal blocking agents  . Chronic low back pain   . Atrial fibrillation     Per record has Afib/Aflutter with SSS  . PVD (peripheral vascular disease)     Right superficial femoral to peroneal artery bypass graft with nonreversed saphenous vein composite  2002  . Mesenteric ischemia     SMA artery embolectomy in 2002  . Dementia     mild/H&P (12/13/2011)  . Prostate cancer   . Shortness of breath     "@ night" (12/13/2011)    Past Surgical History  Procedure Date  . Transurethral resection of prostate   . Embolectomy 2002    SMA artery  . Femoral-peroneal bypass graft 2002     right superficial (12/13/2011)    WJX:BJYNWG of systems complete and found to be negative unless listed above  PHYSICAL EXAM BP 146/64  Pulse 48  Ht 5\' 7"  (1.702 m)  Wt 115 lb 12.8 oz (52.527 kg)  BMI 18.14 kg/m2  General: Well developed, well nourished, in no acute , HOH, mild dementia.  Head: Eyes PERRLA, No xanthomas.   Normal cephalic and atramatic  Lungs: Clear bilaterally to auscultation and percussion. Heart: HRRR S1 S2, without with diastolic murmur, prominent at the apex.  Pulses are 2+ & equal.            No carotid bruit. No JVD.  No abdominal bruits. No femoral bruits. Abdomen: Bowel sounds are positive, abdomen soft and non-tender without masses or                  Hernia's noted. Msk:  Back normal, normal gait. Normal strength and tone for age. Extremities: No clubbing, cyanosis or edema.  DP +1 Neuro: Alert and oriented X 3. Psych:  Good affect, responds appropriately EKG: Reviewed from recent hospitalization-V-paced rate of 50.   ASSESSMENT AND PLAN

## 2012-01-23 NOTE — Patient Instructions (Addendum)
Your physician recommends that you schedule a follow-up appointment in: 6 months  

## 2012-01-23 NOTE — Assessment & Plan Note (Signed)
He is doing very well today. Medtronic pacemaker, single chamber, with Medtronic Ridge generator.  The site looks healthy and without evidence of infection. He has more energy and is breathing much better. He will continue to be followed by Dr. Ladona Ridgel in the [place] office for usual pacemaker evaluation. He will follow up with Dr. Dietrich Pates in 6 months.

## 2012-04-07 ENCOUNTER — Other Ambulatory Visit: Payer: Self-pay | Admitting: Cardiology

## 2012-04-17 ENCOUNTER — Encounter: Payer: Medicare Other | Admitting: Internal Medicine

## 2012-06-15 ENCOUNTER — Ambulatory Visit (INDEPENDENT_AMBULATORY_CARE_PROVIDER_SITE_OTHER): Payer: Medicare Other | Admitting: Internal Medicine

## 2012-06-15 ENCOUNTER — Encounter: Payer: Self-pay | Admitting: Internal Medicine

## 2012-06-15 VITALS — BP 118/60 | HR 47 | Ht 66.0 in | Wt 119.0 lb

## 2012-06-15 DIAGNOSIS — I4891 Unspecified atrial fibrillation: Secondary | ICD-10-CM

## 2012-06-15 DIAGNOSIS — I10 Essential (primary) hypertension: Secondary | ICD-10-CM

## 2012-06-15 DIAGNOSIS — Z950 Presence of cardiac pacemaker: Secondary | ICD-10-CM

## 2012-06-15 LAB — PACEMAKER DEVICE OBSERVATION
BMOD-0003RV: 30
BRDY-0002RV: 60 {beats}/min

## 2012-06-15 NOTE — Progress Notes (Signed)
HPI [name] returns today for followup. He is a very pleasant 79 year old man with chronic atrial fibrillation, hypertension, symptomatic bradycardia, status post permanent pacemaker insertion. In the interim, he has done well. He denies chest pain, shortness of breath, or peripheral edema. No syncope. No Known Allergies   Current Outpatient Prescriptions  Medication Sig Dispense Refill  . amLODipine (NORVASC) 2.5 MG tablet Take 1 tablet (2.5 mg total) by mouth daily.  30 tablet  6  . iron polysaccharides (NIFEREX) 150 MG capsule Take 150 mg by mouth daily.      Marland Kitchen olmesartan (BENICAR) 40 MG tablet Take 1 tablet (40 mg total) by mouth daily.  30 tablet  6  . TRAVATAN Z 0.004 % SOLN ophthalmic solution Place 1 drop into both eyes at bedtime.       Marland Kitchen warfarin (COUMADIN) 5 MG tablet Take 0.5-1 tablets (2.5-5 mg total) by mouth daily. Take coumadin 1 tablet daily except 1/2 tablet on Mondays and Thursdays.      Marland Kitchen zolpidem (AMBIEN) 5 MG tablet Take 2.5 mg by mouth at bedtime as needed.       No current facility-administered medications for this visit.     Past Medical History  Diagnosis Date  . Rheumatic heart disease     moderate mitral stenosis and history of CHF  . Hypertension   . Sick sinus syndrome      Paroxysmal and then constant AF; slow rate on modest AV nodal blocking agents  . Chronic low back pain   . Atrial fibrillation     Per record has Afib/Aflutter with SSS  . PVD (peripheral vascular disease)     Right superficial femoral to peroneal artery bypass graft with nonreversed saphenous vein composite  2002  . Mesenteric ischemia     SMA artery embolectomy in 2002  . Dementia     mild/H&P (12/13/2011)  . Prostate cancer   . Shortness of breath     "@ night" (12/13/2011)    ROS:   All systems reviewed and negative except as noted in the HPI.   Past Surgical History  Procedure Laterality Date  . Transurethral resection of prostate    . Embolectomy  2002    SMA  artery  . Femoral-peroneal bypass graft  2002    right superficial (12/13/2011)     History reviewed. No pertinent family history.   History   Social History  . Marital Status: Widowed    Spouse Name: N/A    Number of Children: N/A  . Years of Education: N/A   Occupational History  . retired     yard work Materials engineer   Social History Main Topics  . Smoking status: Never Smoker   . Smokeless tobacco: Never Used  . Alcohol Use: No  . Drug Use: No  . Sexually Active: No   Other Topics Concern  . Not on file   Social History Narrative  . No narrative on file     BP 118/60  Pulse 47  Ht 5\' 6"  (1.676 m)  Wt 119 lb (53.978 kg)  BMI 19.22 kg/m2  Physical Exam:  Well appearing elderly man,NAD HEENT: Unremarkable, except for poor dentition Neck:  6 cm JVD, no thyromegally Lungs:  Clear with no wheezes, rales, or rhonchi. HEART:  IRegular rate rhythm, no murmurs, no rubs, no clicks Abd:  soft, positive bowel sounds, no organomegally, no rebound, no guarding Ext:  2 plus pulses, no edema, no cyanosis, no clubbing Skin:  No  rashes no nodules Neuro:  CN II through XII intact, motor grossly intact   DEVICE  Normal device function.  See PaceArt for details.   Assess/Plan:

## 2012-06-15 NOTE — Patient Instructions (Addendum)
Your physician recommends that you schedule a follow-up appointment in: 6 months with Dr Ladona Ridgel

## 2012-06-15 NOTE — Assessment & Plan Note (Signed)
His Medtronic single-chamber pacemaker is working normally. We have increased his ventricular pacing right today from 45 beats a minute to 60 beats a minute. Plan to recheck his device in several months.

## 2012-06-15 NOTE — Assessment & Plan Note (Signed)
His ventricular rate is well controlled, in fact is very slow. He will continue his current medical therapy including warfarin.

## 2012-06-15 NOTE — Assessment & Plan Note (Signed)
His blood pressure is well controlled today. No change in medical therapy. 

## 2012-07-03 ENCOUNTER — Other Ambulatory Visit (HOSPITAL_COMMUNITY): Payer: Self-pay | Admitting: Nurse Practitioner

## 2012-07-30 ENCOUNTER — Ambulatory Visit (INDEPENDENT_AMBULATORY_CARE_PROVIDER_SITE_OTHER): Payer: Medicare Other | Admitting: Otolaryngology

## 2012-07-30 DIAGNOSIS — H6120 Impacted cerumen, unspecified ear: Secondary | ICD-10-CM

## 2012-07-30 DIAGNOSIS — H903 Sensorineural hearing loss, bilateral: Secondary | ICD-10-CM

## 2012-08-05 ENCOUNTER — Other Ambulatory Visit: Payer: Self-pay | Admitting: Cardiology

## 2012-11-11 ENCOUNTER — Encounter: Payer: Self-pay | Admitting: Pharmacist

## 2012-11-27 ENCOUNTER — Encounter: Payer: Medicare Other | Admitting: Internal Medicine

## 2012-12-14 ENCOUNTER — Encounter: Payer: Self-pay | Admitting: Internal Medicine

## 2012-12-14 ENCOUNTER — Ambulatory Visit (INDEPENDENT_AMBULATORY_CARE_PROVIDER_SITE_OTHER): Payer: Medicare Other | Admitting: Internal Medicine

## 2012-12-14 VITALS — BP 177/93 | HR 60 | Ht 63.0 in | Wt 133.8 lb

## 2012-12-14 DIAGNOSIS — I4891 Unspecified atrial fibrillation: Secondary | ICD-10-CM

## 2012-12-14 DIAGNOSIS — Z950 Presence of cardiac pacemaker: Secondary | ICD-10-CM

## 2012-12-14 DIAGNOSIS — I10 Essential (primary) hypertension: Secondary | ICD-10-CM

## 2012-12-14 LAB — PACEMAKER DEVICE OBSERVATION
BMOD-0003RV: 30
BRDY-0002RV: 60 {beats}/min
BRDY-0004RV: 130 {beats}/min
RV LEAD THRESHOLD: 0.5 V
VENTRICULAR PACING PM: 72

## 2012-12-14 MED ORDER — AMLODIPINE BESYLATE 5 MG PO TABS: 5.0000 mg | ORAL_TABLET | Freq: Every day | ORAL | Status: AC

## 2012-12-14 NOTE — Progress Notes (Signed)
HPI Mr. Buntyn returns today for followup. He is a pleasant 79 yo man with HTN, peripheral vascular disease, chronic atrial fib, sick sinus syndrome, s/p PPM insertion. In the interim he has been stable. He admits to dietary indiscretion. He has not had syncope. No peripheral edema or sob. He has no palpitations. No Known Allergies   Current Outpatient Prescriptions  Medication Sig Dispense Refill  . amLODipine (NORVASC) 2.5 MG tablet take 1 tablet by mouth once daily  30 tablet  6  . iron polysaccharides (NIFEREX) 150 MG capsule Take 150 mg by mouth daily.      . TRAVATAN Z 0.004 % SOLN ophthalmic solution Place 1 drop into both eyes at bedtime.       Marland Kitchen warfarin (COUMADIN) 5 MG tablet TAKE 1/2 TABLET BY MOUTH DAILY EXCEPT TAKE 1 TABLET ON TUESDAYS AND FRIDAYS  30 tablet  3  . zolpidem (AMBIEN) 5 MG tablet Take 2.5 mg by mouth at bedtime as needed.      Marland Kitchen olmesartan (BENICAR) 40 MG tablet Take 1 tablet (40 mg total) by mouth daily.  30 tablet  6   No current facility-administered medications for this visit.     Past Medical History  Diagnosis Date  . Rheumatic heart disease     moderate mitral stenosis and history of CHF  . Hypertension   . Sick sinus syndrome      Paroxysmal and then constant AF; slow rate on modest AV nodal blocking agents  . Chronic low back pain   . Atrial fibrillation     Per record has Afib/Aflutter with SSS  . PVD (peripheral vascular disease)     Right superficial femoral to peroneal artery bypass graft with nonreversed saphenous vein composite  2002  . Mesenteric ischemia     SMA artery embolectomy in 2002  . Dementia     mild/H&P (12/13/2011)  . Prostate cancer   . Shortness of breath     "@ night" (12/13/2011)    ROS:   All systems reviewed and negative except as noted in the HPI.   Past Surgical History  Procedure Laterality Date  . Transurethral resection of prostate    . Embolectomy  2002    SMA artery  . Femoral-peroneal bypass graft   2002    right superficial (12/13/2011)     History reviewed. No pertinent family history.   History   Social History  . Marital Status: Widowed    Spouse Name: N/A    Number of Children: N/A  . Years of Education: N/A   Occupational History  . retired     yard work Materials engineer   Social History Main Topics  . Smoking status: Never Smoker   . Smokeless tobacco: Never Used  . Alcohol Use: No  . Drug Use: No  . Sexual Activity: No   Other Topics Concern  . Not on file   Social History Narrative  . No narrative on file     BP 177/93  Pulse 60  Ht 5\' 3"  (1.6 m)  Wt 133 lb 12 oz (60.669 kg)  BMI 23.7 kg/m2  Physical Exam:  Well appearing elderly man, NAD HEENT: Unremarkable Neck:  7 cm JVD, no thyromegally Back:  No CVA tenderness Lungs:  Clear with no wheezes, rales, or rhonchi. HEART:  Regular rate rhythm, no murmurs, no rubs, no clicks, widely split S2.  Abd:  soft, positive bowel sounds, no organomegally, no rebound, no guarding Ext:  2 plus pulses,  no edema, no cyanosis, no clubbing Skin:  No rashes no nodules Neuro:  CN II through XII intact, motor grossly intact   DEVICE  Normal device function.  See PaceArt for details.   Assess/Plan:

## 2012-12-14 NOTE — Patient Instructions (Addendum)
Your physician recommends that you schedule a follow-up appointment in: 6 MONTHS WITH PAULA/ONE YEAR  Your physician has recommended you make the following change in your medication:   1) INCREASE YOUR AMLODIPINE TO 5MG  ONCE DAILY (YOU CAN TAKE TWO TABLETS OF YOUR CURRENT PRESCRIPTION TO EQUAL 5MG  UNTIL YOUR PRESCRIPTION RUNS OUT, WE WILL SEND IN A NEW PRESCRIPTION TO YOUR PHARMACY)

## 2012-12-14 NOTE — Assessment & Plan Note (Signed)
His blood pressure is elevated. I have asked her to reduce his sodium intake. Will ask him to increase his dose of amlodipine to 5 mg daily

## 2012-12-14 NOTE — Assessment & Plan Note (Signed)
His PPM is working normally. Will recheck in several months 

## 2013-01-18 ENCOUNTER — Other Ambulatory Visit: Payer: Self-pay | Admitting: Cardiology

## 2013-01-19 NOTE — Telephone Encounter (Signed)
Pt daughter calling for refill of warfrin

## 2013-03-22 IMAGING — CT CT HEAD W/O CM
1 series · 16 of 30 positions shown, 20 images · non-contrast
Comparison: 12/06/2011.

CLINICAL DATA: Syncope.

CT HEAD WITHOUT CONTRAST
TECHNIQUE: Contiguous axial images were obtained from the base of
the skull through the vertex without contrast.

[Series 2: headtrauma 4.8 h37s · axial · 0.44mm/px · z∈[+86,+216]mm · 16 of 30 slices shown, 20 images]
[im 2/30  brain]
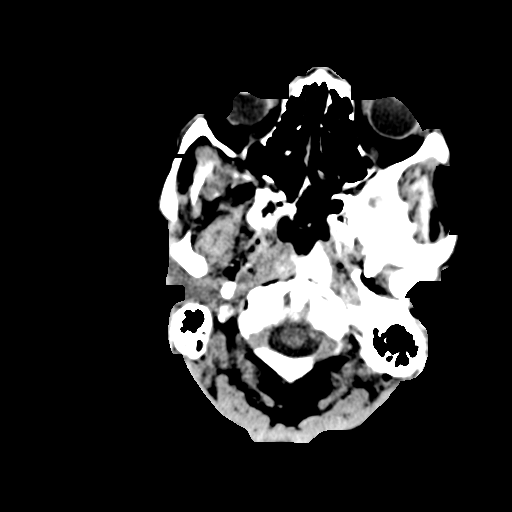
[im 2/30  bone]
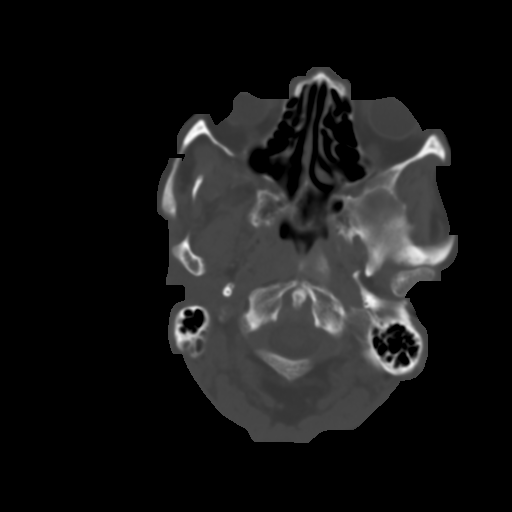
[im 4/30  brain]
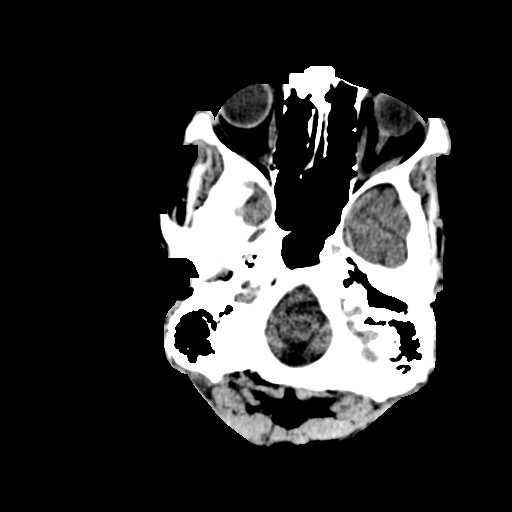
[im 6/30  brain]
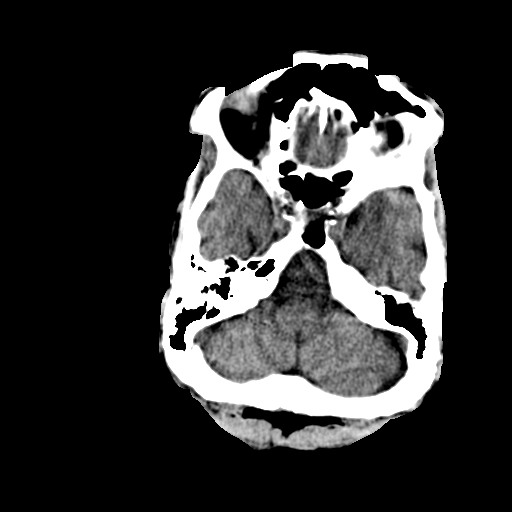
[im 8/30  brain]
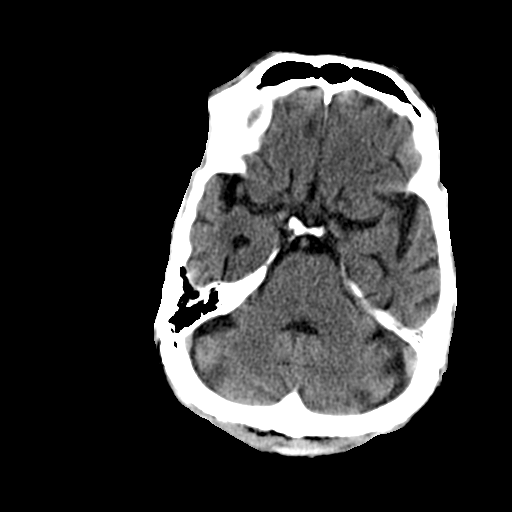
[im 9/30  brain]
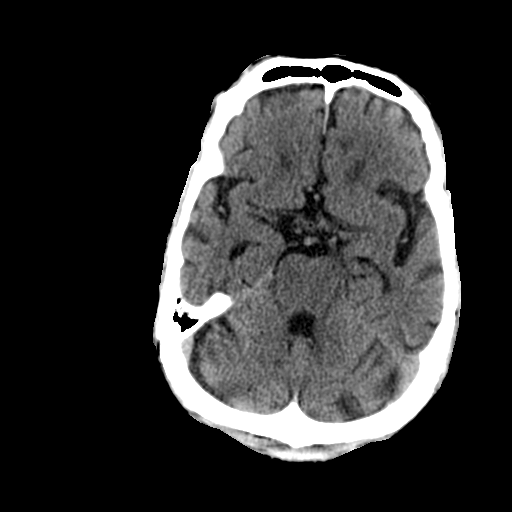
[im 9/30  bone]
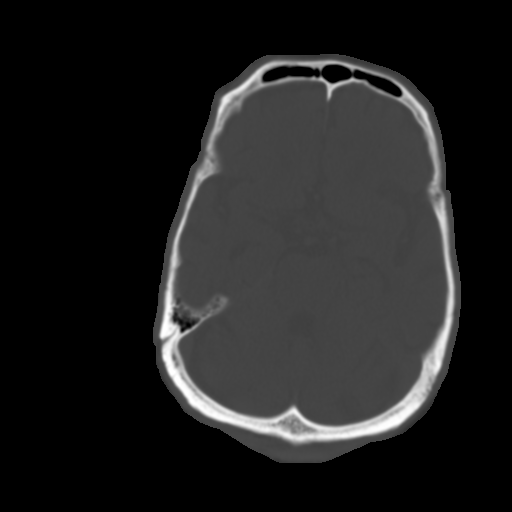
[im 11/30  brain]
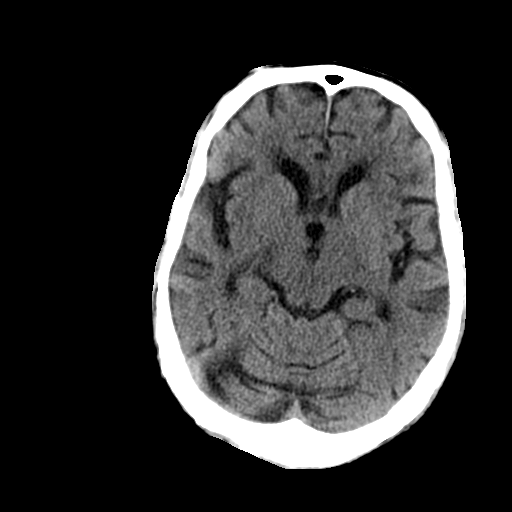
[im 13/30  brain]
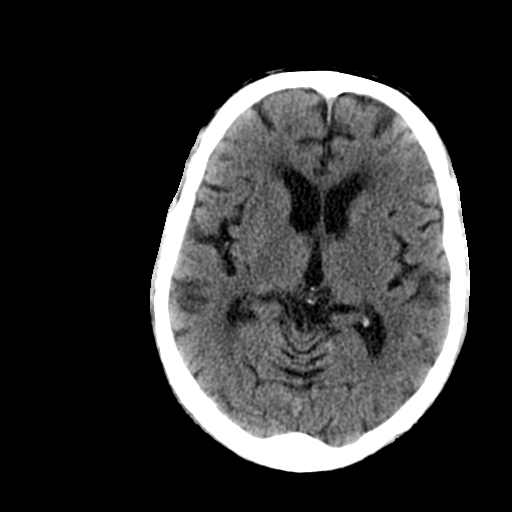
[im 15/30  brain]
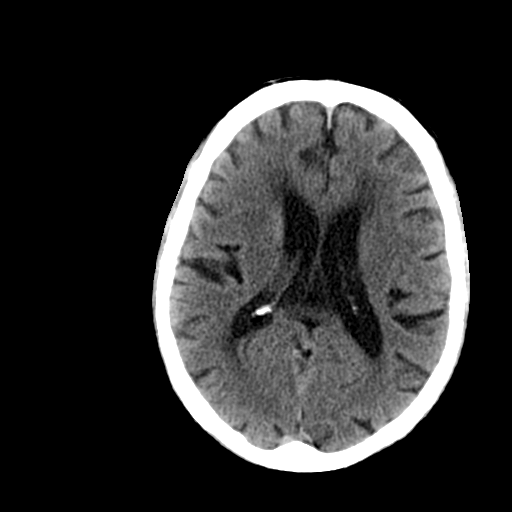
[im 16/30  brain]
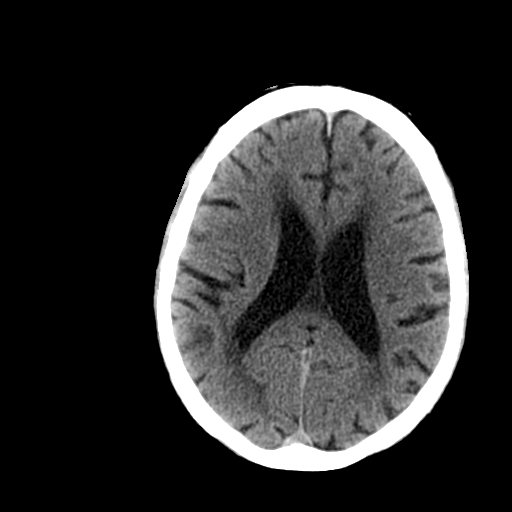
[im 16/30  bone]
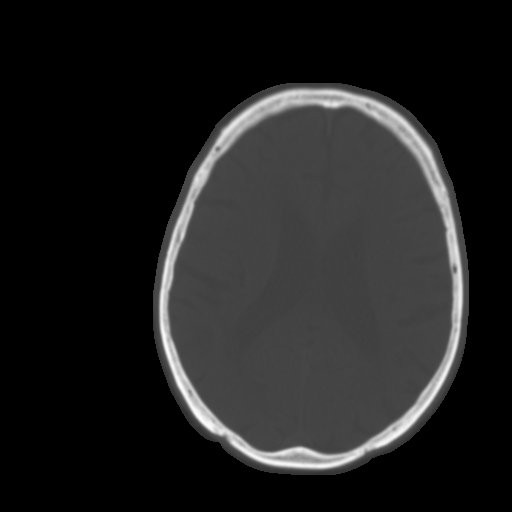
[im 18/30  brain]
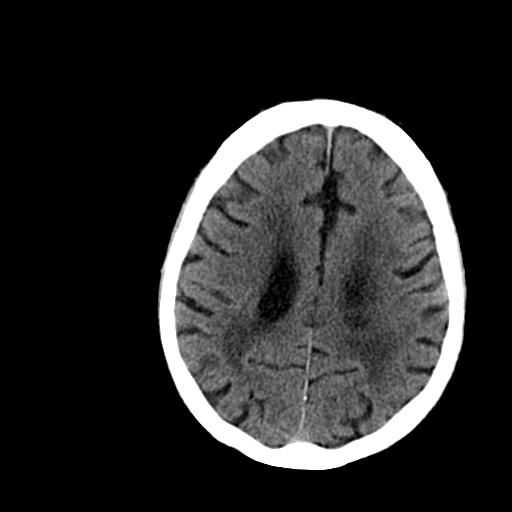
[im 20/30  brain]
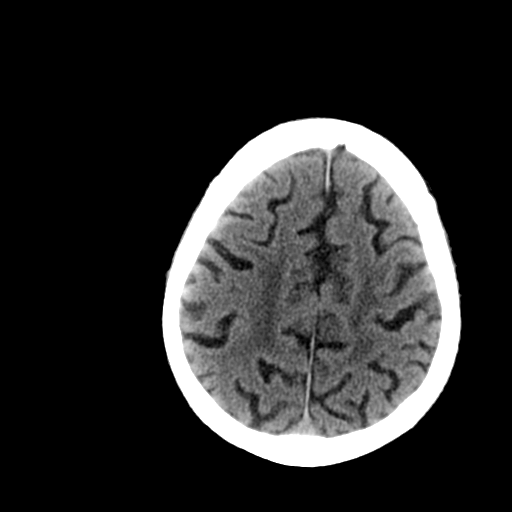
[im 22/30  brain]
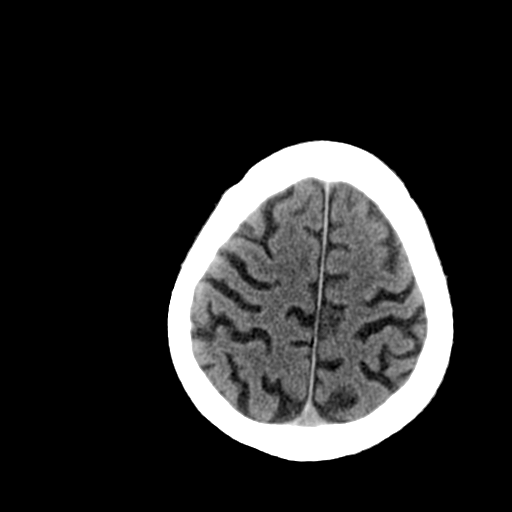
[im 23/30  brain]
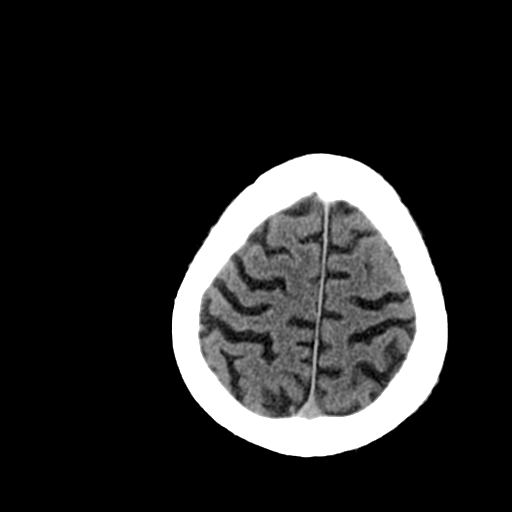
[im 23/30  bone]
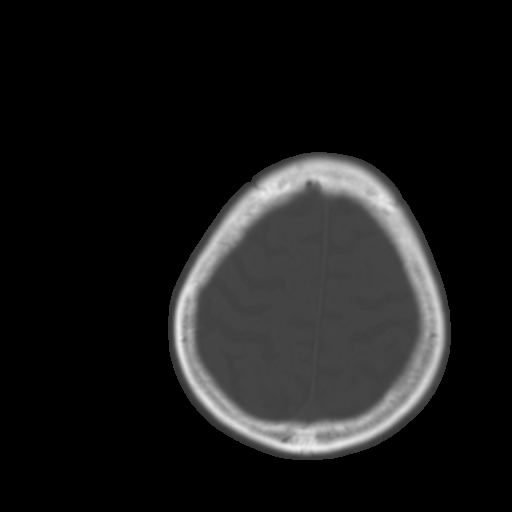
[im 25/30  brain]
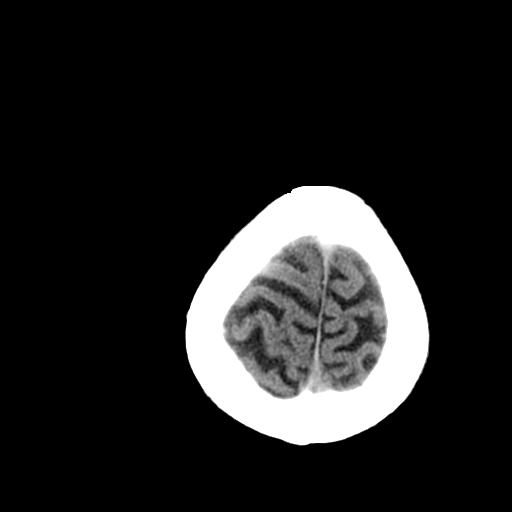
[im 27/30  brain]
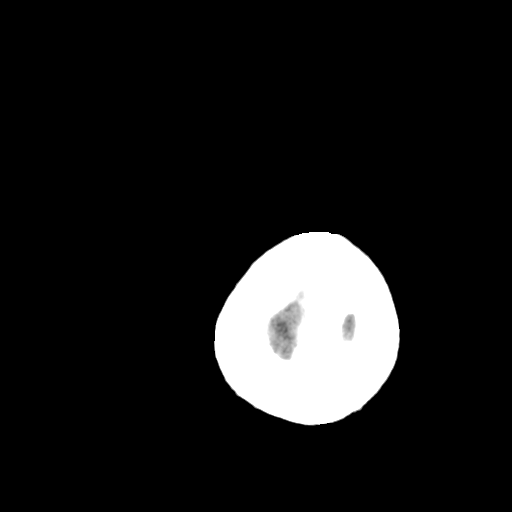
[im 29/30  brain]
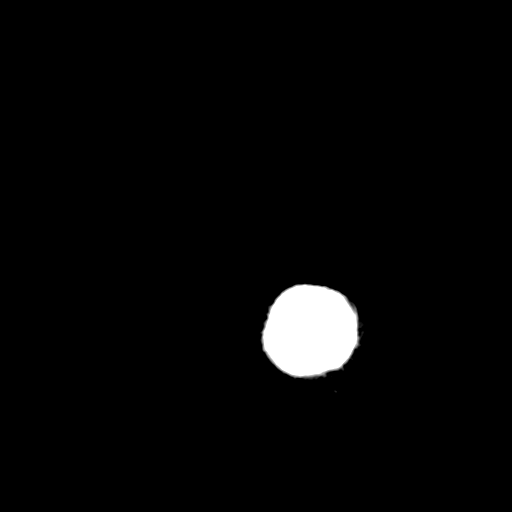

[16 of 30 positions shown; findings below may reference images not displayed]

FINDINGS: No evidence of acute infarct, acute hemorrhage, mass
lesion, mass effect or hydrocephalus.  There may be a small remote
lacunar infarct in the left basal ganglia.  Atrophy.
Periventricular low attenuation.

Opacification of a single right ethmoid air cell.  Visualized
portions of the paranasal sinuses and mastoid air cells are
otherwise clear.
IMPRESSION: 1.  No acute intracranial abnormality.
2.  Atrophy and chronic microvascular white matter ischemic
changes.

## 2013-03-22 IMAGING — CR DG CHEST 1V PORT
1 series · 1 of 1 positions shown · non-contrast
Comparison: 12/06/2011.

CLINICAL DATA: Chest pain.

PORTABLE CHEST - 1 VIEW

[view not recorded]
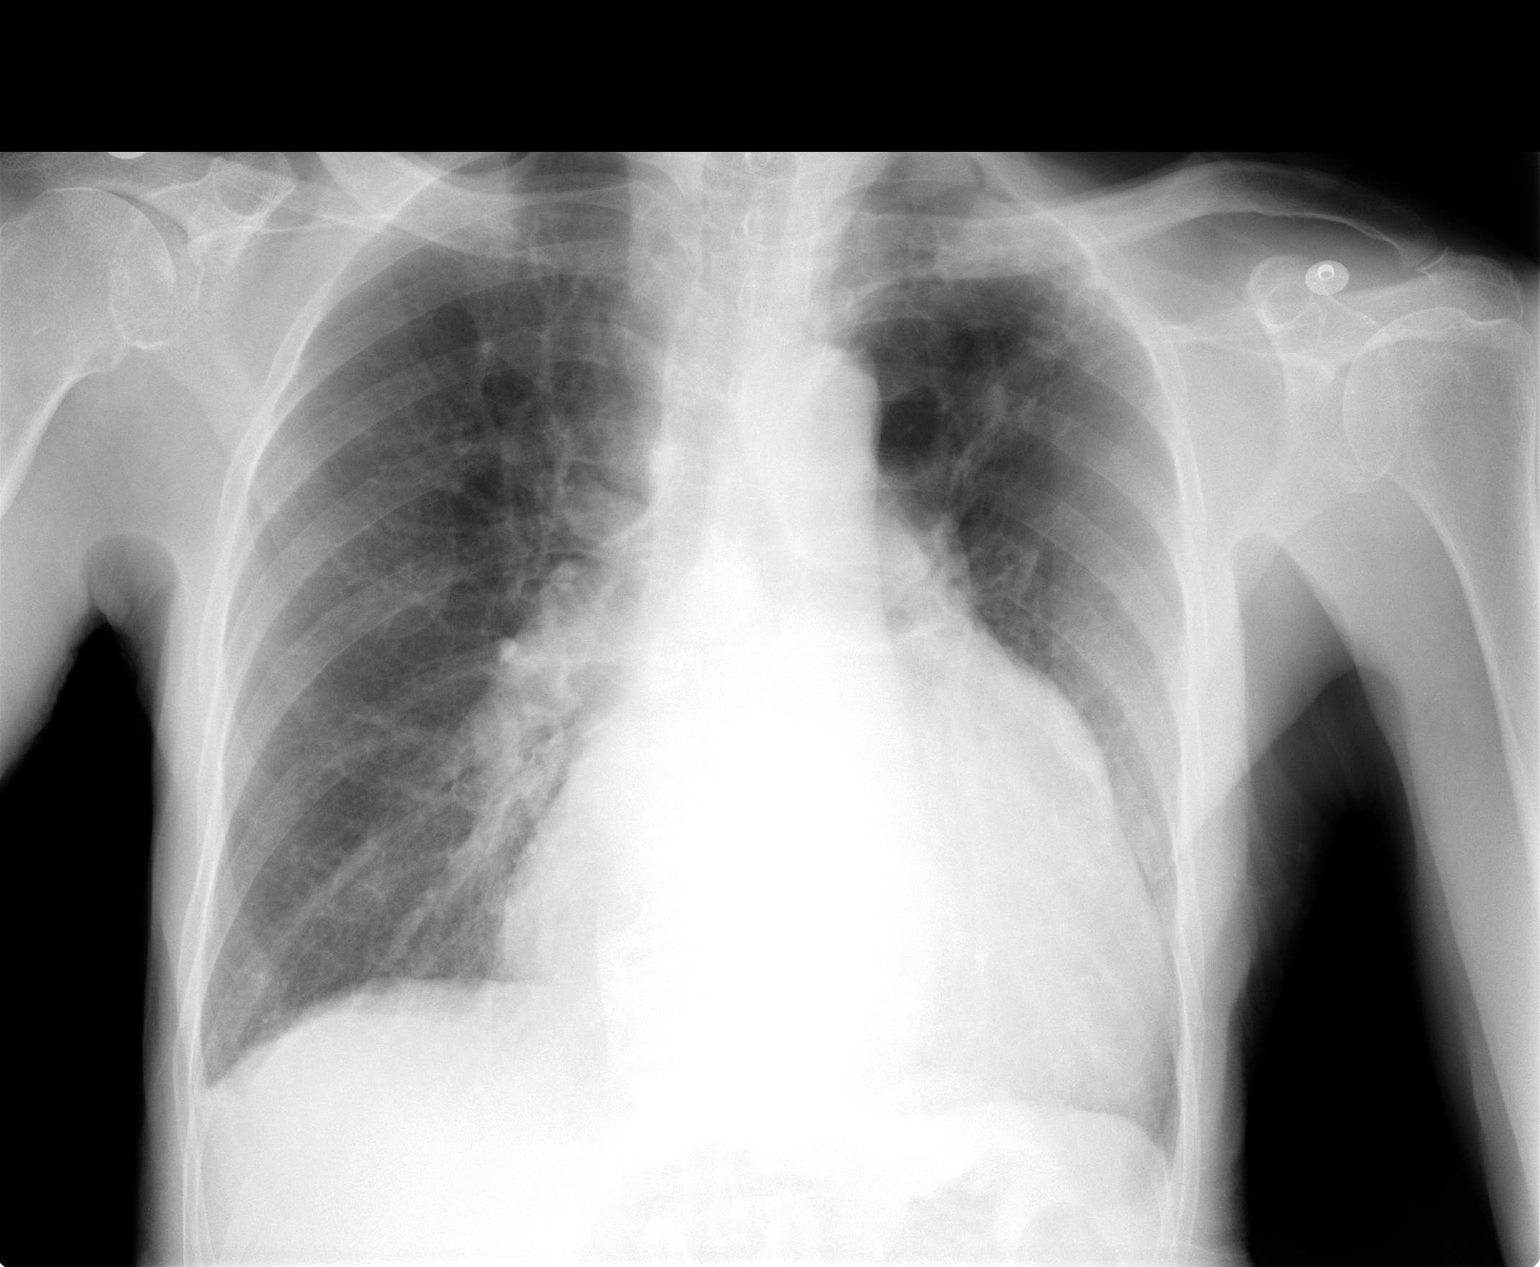

[1 of 1 positions shown; findings below may reference images not displayed]

FINDINGS: Trachea is midline.  Heart is enlarged as are the
pulmonary arteries.  Left atrial appendage appears prominent.
Lungs are emphysematous with scarring at the apices.  There may be
tiny bilateral pleural effusions.
IMPRESSION: 1.  Emphysema with biapical pleural parenchymal scarring.
2.  Cardiac enlargement with pulmonary arterial hypertension.
3.  Question tiny bilateral pleural effusions.

## 2013-05-05 ENCOUNTER — Encounter: Payer: Self-pay | Admitting: *Deleted

## 2013-05-18 ENCOUNTER — Telehealth: Payer: Self-pay | Admitting: *Deleted

## 2013-05-18 ENCOUNTER — Telehealth: Payer: Self-pay | Admitting: Internal Medicine

## 2013-05-18 MED ORDER — WARFARIN SODIUM 5 MG PO TABS: 5.0000 mg | ORAL_TABLET | Freq: Once | ORAL | Status: DC

## 2013-05-18 NOTE — Telephone Encounter (Signed)
Refill complete 

## 2013-05-18 NOTE — Telephone Encounter (Signed)
Received fax refill request  Rx # [id number] Medication:  Warfarin Sodium 5 mg tablet Qty 30 Sig:  Take a 1/2 tablet by mouth daily except take one tablet by mouth on Tuesdays and Fridays Physician:  Lovena Le

## 2013-05-18 NOTE — Telephone Encounter (Signed)
Received fax refill request from [facility]  Rx # [id number] Medication:  Warfarin 5 mg Qty #30 Sig: Physician:

## 2013-05-24 ENCOUNTER — Ambulatory Visit (INDEPENDENT_AMBULATORY_CARE_PROVIDER_SITE_OTHER): Payer: Medicare HMO | Admitting: *Deleted

## 2013-05-24 DIAGNOSIS — Z7901 Long term (current) use of anticoagulants: Secondary | ICD-10-CM

## 2013-05-24 DIAGNOSIS — I4891 Unspecified atrial fibrillation: Secondary | ICD-10-CM

## 2013-05-24 DIAGNOSIS — Z5181 Encounter for therapeutic drug level monitoring: Secondary | ICD-10-CM | POA: Insufficient documentation

## 2013-05-24 LAB — POCT INR: INR: 2

## 2013-06-25 ENCOUNTER — Encounter: Payer: Self-pay | Admitting: *Deleted

## 2013-07-15 ENCOUNTER — Ambulatory Visit (INDEPENDENT_AMBULATORY_CARE_PROVIDER_SITE_OTHER): Payer: Medicare HMO | Admitting: *Deleted

## 2013-07-15 DIAGNOSIS — I4891 Unspecified atrial fibrillation: Secondary | ICD-10-CM

## 2013-07-15 DIAGNOSIS — Z5181 Encounter for therapeutic drug level monitoring: Secondary | ICD-10-CM

## 2013-07-15 DIAGNOSIS — Z7901 Long term (current) use of anticoagulants: Secondary | ICD-10-CM

## 2013-07-15 LAB — POCT INR: INR: 2.1

## 2013-08-12 ENCOUNTER — Ambulatory Visit (INDEPENDENT_AMBULATORY_CARE_PROVIDER_SITE_OTHER): Payer: Medicare HMO | Admitting: *Deleted

## 2013-08-12 DIAGNOSIS — Z5181 Encounter for therapeutic drug level monitoring: Secondary | ICD-10-CM

## 2013-08-12 DIAGNOSIS — I4891 Unspecified atrial fibrillation: Secondary | ICD-10-CM

## 2013-08-12 DIAGNOSIS — Z7901 Long term (current) use of anticoagulants: Secondary | ICD-10-CM

## 2013-08-12 LAB — POCT INR: INR: 2.4

## 2013-09-23 ENCOUNTER — Ambulatory Visit (INDEPENDENT_AMBULATORY_CARE_PROVIDER_SITE_OTHER): Payer: Medicare HMO | Admitting: *Deleted

## 2013-09-23 DIAGNOSIS — Z7901 Long term (current) use of anticoagulants: Secondary | ICD-10-CM

## 2013-09-23 DIAGNOSIS — I4891 Unspecified atrial fibrillation: Secondary | ICD-10-CM

## 2013-09-23 DIAGNOSIS — Z5181 Encounter for therapeutic drug level monitoring: Secondary | ICD-10-CM

## 2013-09-23 LAB — POCT INR: INR: 3

## 2013-09-25 ENCOUNTER — Encounter (HOSPITAL_COMMUNITY): Payer: Self-pay | Admitting: Emergency Medicine

## 2013-09-25 ENCOUNTER — Emergency Department (HOSPITAL_COMMUNITY): Payer: Medicare HMO

## 2013-09-25 ENCOUNTER — Inpatient Hospital Stay (HOSPITAL_COMMUNITY)
Admission: EM | Admit: 2013-09-25 | Discharge: 2013-10-04 | DRG: 482 | Disposition: A | Discharge status: Skilled Nursing Facility | Payer: Medicare HMO | Attending: Internal Medicine | Admitting: Internal Medicine

## 2013-09-25 DIAGNOSIS — M545 Low back pain, unspecified: Secondary | ICD-10-CM | POA: Diagnosis present

## 2013-09-25 DIAGNOSIS — I509 Heart failure, unspecified: Secondary | ICD-10-CM | POA: Diagnosis present

## 2013-09-25 DIAGNOSIS — W010XXA Fall on same level from slipping, tripping and stumbling without subsequent striking against object, initial encounter: Secondary | ICD-10-CM | POA: Diagnosis present

## 2013-09-25 DIAGNOSIS — S72143A Displaced intertrochanteric fracture of unspecified femur, initial encounter for closed fracture: Secondary | ICD-10-CM | POA: Diagnosis present

## 2013-09-25 DIAGNOSIS — H9190 Unspecified hearing loss, unspecified ear: Secondary | ICD-10-CM | POA: Diagnosis present

## 2013-09-25 DIAGNOSIS — F0390 Unspecified dementia without behavioral disturbance: Secondary | ICD-10-CM | POA: Diagnosis present

## 2013-09-25 DIAGNOSIS — S72009A Fracture of unspecified part of neck of unspecified femur, initial encounter for closed fracture: Secondary | ICD-10-CM

## 2013-09-25 DIAGNOSIS — I4891 Unspecified atrial fibrillation: Secondary | ICD-10-CM

## 2013-09-25 DIAGNOSIS — I080 Rheumatic disorders of both mitral and aortic valves: Secondary | ICD-10-CM | POA: Diagnosis present

## 2013-09-25 DIAGNOSIS — M25559 Pain in unspecified hip: Secondary | ICD-10-CM | POA: Diagnosis present

## 2013-09-25 DIAGNOSIS — T45515A Adverse effect of anticoagulants, initial encounter: Secondary | ICD-10-CM

## 2013-09-25 DIAGNOSIS — Z8546 Personal history of malignant neoplasm of prostate: Secondary | ICD-10-CM

## 2013-09-25 DIAGNOSIS — D6832 Hemorrhagic disorder due to extrinsic circulating anticoagulants: Secondary | ICD-10-CM

## 2013-09-25 DIAGNOSIS — Z7901 Long term (current) use of anticoagulants: Secondary | ICD-10-CM

## 2013-09-25 DIAGNOSIS — G8929 Other chronic pain: Secondary | ICD-10-CM | POA: Diagnosis present

## 2013-09-25 DIAGNOSIS — I10 Essential (primary) hypertension: Secondary | ICD-10-CM | POA: Diagnosis present

## 2013-09-25 DIAGNOSIS — E785 Hyperlipidemia, unspecified: Secondary | ICD-10-CM | POA: Diagnosis present

## 2013-09-25 DIAGNOSIS — Y92009 Unspecified place in unspecified non-institutional (private) residence as the place of occurrence of the external cause: Secondary | ICD-10-CM

## 2013-09-25 DIAGNOSIS — I739 Peripheral vascular disease, unspecified: Secondary | ICD-10-CM | POA: Diagnosis present

## 2013-09-25 DIAGNOSIS — Z950 Presence of cardiac pacemaker: Secondary | ICD-10-CM | POA: Diagnosis not present

## 2013-09-25 DIAGNOSIS — N390 Urinary tract infection, site not specified: Secondary | ICD-10-CM

## 2013-09-25 DIAGNOSIS — S72141A Displaced intertrochanteric fracture of right femur, initial encounter for closed fracture: Secondary | ICD-10-CM

## 2013-09-25 HISTORY — DX: Presence of cardiac pacemaker: Z95.0

## 2013-09-25 LAB — URINALYSIS, ROUTINE W REFLEX MICROSCOPIC
BILIRUBIN URINE: NEGATIVE
Glucose, UA: NEGATIVE mg/dL
HGB URINE DIPSTICK: NEGATIVE
KETONES UR: NEGATIVE mg/dL
Leukocytes, UA: NEGATIVE
NITRITE: NEGATIVE
PROTEIN: 30 mg/dL — AB
Specific Gravity, Urine: 1.015 (ref 1.005–1.030)
Urobilinogen, UA: 0.2 mg/dL (ref 0.0–1.0)
pH: 8.5 — ABNORMAL HIGH (ref 5.0–8.0)

## 2013-09-25 LAB — COMPREHENSIVE METABOLIC PANEL
ALK PHOS: 93 U/L (ref 39–117)
ALT: 13 U/L (ref 0–53)
AST: 22 U/L (ref 0–37)
Albumin: 3.4 g/dL — ABNORMAL LOW (ref 3.5–5.2)
Anion gap: 13 (ref 5–15)
BUN: 20 mg/dL (ref 6–23)
CO2: 26 meq/L (ref 19–32)
Calcium: 9.3 mg/dL (ref 8.4–10.5)
Chloride: 102 mEq/L (ref 96–112)
Creatinine, Ser: 1.05 mg/dL (ref 0.50–1.35)
GFR calc Af Amer: 72 mL/min — ABNORMAL LOW (ref >90)
GFR, EST NON AFRICAN AMERICAN: 62 mL/min — AB (ref >90)
GLUCOSE: 114 mg/dL — AB (ref 70–99)
POTASSIUM: 3.8 meq/L (ref 3.7–5.3)
SODIUM: 141 meq/L (ref 137–147)
Total Bilirubin: 0.3 mg/dL (ref 0.3–1.2)
Total Protein: 7.6 g/dL (ref 6.0–8.3)

## 2013-09-25 LAB — CBC WITH DIFFERENTIAL/PLATELET
BASOS PCT: 1 % (ref 0–1)
Basophils Absolute: 0 10*3/uL (ref 0.0–0.1)
EOS PCT: 1 % (ref 0–5)
Eosinophils Absolute: 0 10*3/uL (ref 0.0–0.7)
HCT: 40.3 % (ref 39.0–52.0)
HEMOGLOBIN: 13.4 g/dL (ref 13.0–17.0)
LYMPHS PCT: 25 % (ref 12–46)
Lymphs Abs: 1 10*3/uL (ref 0.7–4.0)
MCH: 28.3 pg (ref 26.0–34.0)
MCHC: 33.3 g/dL (ref 30.0–36.0)
MCV: 85 fL (ref 78.0–100.0)
MONO ABS: 0.7 10*3/uL (ref 0.1–1.0)
Monocytes Relative: 18 % — ABNORMAL HIGH (ref 3–12)
Neutro Abs: 2.3 10*3/uL (ref 1.7–7.7)
Neutrophils Relative %: 55 % (ref 43–77)
Platelets: 161 10*3/uL (ref 150–400)
RBC: 4.74 MIL/uL (ref 4.22–5.81)
RDW: 14.6 % (ref 11.5–15.5)
WBC: 4 10*3/uL (ref 4.0–10.5)

## 2013-09-25 LAB — SAMPLE TO BLOOD BANK

## 2013-09-25 LAB — PROTIME-INR
INR: 2.4 — ABNORMAL HIGH (ref 0.00–1.49)
PROTHROMBIN TIME: 26.2 s — AB (ref 11.6–15.2)

## 2013-09-25 LAB — URINE MICROSCOPIC-ADD ON

## 2013-09-25 LAB — TYPE AND SCREEN
ABO/RH(D): O POS
ANTIBODY SCREEN: NEGATIVE

## 2013-09-25 LAB — APTT: aPTT: 43 seconds — ABNORMAL HIGH (ref 24–37)

## 2013-09-25 MED ORDER — DEXTROSE 5 % IV SOLN
1.0000 g | Freq: Once | INTRAVENOUS | Status: AC
  Administered 2013-09-25: 1 g via INTRAVENOUS
  Filled 2013-09-25: qty 10

## 2013-09-25 MED ORDER — DONEPEZIL HCL 5 MG PO TABS
5.0000 mg | ORAL_TABLET | Freq: Every day | ORAL | Status: DC
  Administered 2013-09-25 – 2013-10-04 (×10): 5 mg via ORAL
  Filled 2013-09-25 (×10): qty 1

## 2013-09-25 MED ORDER — ONDANSETRON HCL 4 MG/2ML IJ SOLN
4.0000 mg | Freq: Once | INTRAMUSCULAR | Status: AC
  Administered 2013-09-25: 4 mg via INTRAVENOUS
  Filled 2013-09-25: qty 2

## 2013-09-25 MED ORDER — MAGNESIUM CITRATE PO SOLN: 1.0000 | Freq: Once | ORAL | Status: AC | PRN

## 2013-09-25 MED ORDER — POLYSACCHARIDE IRON COMPLEX 150 MG PO CAPS
150.0000 mg | ORAL_CAPSULE | Freq: Every day | ORAL | Status: DC
  Administered 2013-09-26 – 2013-10-04 (×9): 150 mg via ORAL
  Filled 2013-09-25 (×9): qty 1

## 2013-09-25 MED ORDER — LATANOPROST 0.005 % OP SOLN
1.0000 [drp] | Freq: Every day | OPHTHALMIC | Status: DC
Start: 2013-09-25 — End: 2013-10-04
  Administered 2013-09-26 – 2013-10-03 (×8): 1 [drp] via OPHTHALMIC
  Filled 2013-09-25: qty 2.5

## 2013-09-25 MED ORDER — AMLODIPINE BESYLATE 5 MG PO TABS
5.0000 mg | ORAL_TABLET | Freq: Every day | ORAL | Status: DC
  Administered 2013-09-26 – 2013-10-04 (×9): 5 mg via ORAL
  Filled 2013-09-25 (×9): qty 1

## 2013-09-25 MED ORDER — DOCUSATE SODIUM 100 MG PO CAPS
100.0000 mg | ORAL_CAPSULE | Freq: Two times a day (BID) | ORAL | Status: DC
  Administered 2013-09-25 – 2013-10-04 (×18): 100 mg via ORAL
  Filled 2013-09-25 (×18): qty 1

## 2013-09-25 MED ORDER — MORPHINE SULFATE 4 MG/ML IJ SOLN
4.0000 mg | Freq: Once | INTRAMUSCULAR | Status: AC
  Administered 2013-09-25: 4 mg via INTRAVENOUS
  Filled 2013-09-25: qty 1

## 2013-09-25 MED ORDER — METHOCARBAMOL 500 MG PO TABS
500.0000 mg | ORAL_TABLET | Freq: Four times a day (QID) | ORAL | Status: DC | PRN
  Administered 2013-10-02: 500 mg via ORAL
  Filled 2013-09-25: qty 1

## 2013-09-25 MED ORDER — BISACODYL 5 MG PO TBEC
5.0000 mg | DELAYED_RELEASE_TABLET | Freq: Every day | ORAL | Status: DC | PRN
  Administered 2013-10-02 – 2013-10-03 (×2): 5 mg via ORAL
  Filled 2013-09-25: qty 1

## 2013-09-25 MED ORDER — HYDROCODONE-ACETAMINOPHEN 5-325 MG PO TABS
1.0000 | ORAL_TABLET | Freq: Four times a day (QID) | ORAL | Status: DC | PRN
  Administered 2013-09-26: 1 via ORAL
  Administered 2013-09-27 – 2013-10-02 (×10): 2 via ORAL
  Administered 2013-10-02: 1 via ORAL
  Administered 2013-10-02: 2 via ORAL
  Administered 2013-10-03 – 2013-10-04 (×4): 1 via ORAL
  Administered 2013-10-04: 2 via ORAL
  Filled 2013-09-25: qty 1
  Filled 2013-09-25: qty 2
  Filled 2013-09-25: qty 1
  Filled 2013-09-25 (×2): qty 2
  Filled 2013-09-25 (×2): qty 1
  Filled 2013-09-25 (×6): qty 2
  Filled 2013-09-25: qty 1
  Filled 2013-09-25 (×3): qty 2
  Filled 2013-09-25: qty 1
  Filled 2013-09-25: qty 2

## 2013-09-25 MED ORDER — METHOCARBAMOL 1000 MG/10ML IJ SOLN
500.0000 mg | Freq: Four times a day (QID) | INTRAVENOUS | Status: DC | PRN
  Filled 2013-09-25: qty 5

## 2013-09-25 MED ORDER — MORPHINE SULFATE 2 MG/ML IJ SOLN
0.5000 mg | INTRAMUSCULAR | Status: DC | PRN
  Administered 2013-09-25 – 2013-09-30 (×14): 0.5 mg via INTRAVENOUS
  Administered 2013-10-01: 0.25 mg via INTRAVENOUS
  Administered 2013-10-02 – 2013-10-03 (×3): 0.5 mg via INTRAVENOUS
  Filled 2013-09-25 (×18): qty 1

## 2013-09-25 MED ORDER — SODIUM CHLORIDE 0.9 % IV SOLN
INTRAVENOUS | Status: DC
  Administered 2013-09-25: 16:00:00 via INTRAVENOUS

## 2013-09-25 MED ORDER — SODIUM CHLORIDE 0.9 % IV SOLN
INTRAVENOUS | Status: DC
  Administered 2013-09-26: 08:00:00 via INTRAVENOUS

## 2013-09-25 MED ORDER — IRBESARTAN 300 MG PO TABS
300.0000 mg | ORAL_TABLET | Freq: Every day | ORAL | Status: DC
Start: 2013-09-26 — End: 2013-10-04
  Administered 2013-09-26 – 2013-10-04 (×9): 300 mg via ORAL
  Filled 2013-09-25 (×9): qty 1

## 2013-09-25 NOTE — Consult Note (Signed)
Reason for Consult:Fracture of the right hip Referring Physician: ER  JOSHIA KITCHINGS is an 80 y.o. male.  HPI: Patient fell at home.  He has over a year or two history of confusion and disorientation.  He lives with son his daugher-in-law.  He has no other injury.  He has pain of the right hip.  X-rays show intertrochanteric fracture of the right hip with no displacement.  The patient is on Coumadin and INR is 3.0.  It will take several days for this to return to normal.  He can be treated with enoxaparin until surgery.  I will await medical clearance before scheduling hip surgery.  He will need hip compression system.    Past Medical History  Diagnosis Date  . Rheumatic heart disease     moderate mitral stenosis and history of CHF  . Hypertension   . Sick sinus syndrome      Paroxysmal and then constant AF; slow rate on modest AV nodal blocking agents  . Chronic low back pain   . Atrial fibrillation     Per record has Afib/Aflutter with SSS  . PVD (peripheral vascular disease)     Right superficial femoral to peroneal artery bypass graft with nonreversed saphenous vein composite  2002  . Mesenteric ischemia     SMA artery embolectomy in 2002  . Dementia     mild/H&P (12/13/2011)  . Prostate cancer   . Shortness of breath     "@ night" (12/13/2011)  . Pacemaker     Past Surgical History  Procedure Laterality Date  . Transurethral resection of prostate    . Embolectomy  2002    SMA artery  . Femoral-peroneal bypass graft  2002    right superficial (12/13/2011)    No family history on file.  Social History:  reports that he has never smoked. He has never used smokeless tobacco. He reports that he does not drink alcohol or use illicit drugs.  Allergies: No Known Allergies  Medications: I have reviewed the patient's current medications.  Results for orders placed during the hospital encounter of 09/25/13 (from the past 48 hour(s))  CBC WITH DIFFERENTIAL     Status: Abnormal    Collection Time    09/25/13  3:48 PM      Result Value Ref Range   WBC 4.0  4.0 - 10.5 K/uL   RBC 4.74  4.22 - 5.81 MIL/uL   Hemoglobin 13.4  13.0 - 17.0 g/dL   HCT 40.3  39.0 - 52.0 %   MCV 85.0  78.0 - 100.0 fL   MCH 28.3  26.0 - 34.0 pg   MCHC 33.3  30.0 - 36.0 g/dL   RDW 14.6  11.5 - 15.5 %   Platelets 161  150 - 400 K/uL   Neutrophils Relative % 55  43 - 77 %   Lymphocytes Relative 25  12 - 46 %   Monocytes Relative 18 (*) 3 - 12 %   Eosinophils Relative 1  0 - 5 %   Basophils Relative 1  0 - 1 %   Neutro Abs 2.3  1.7 - 7.7 K/uL   Lymphs Abs 1.0  0.7 - 4.0 K/uL   Monocytes Absolute 0.7  0.1 - 1.0 K/uL   Eosinophils Absolute 0.0  0.0 - 0.7 K/uL   Basophils Absolute 0.0  0.0 - 0.1 K/uL   WBC Morphology ATYPICAL LYMPHOCYTES    COMPREHENSIVE METABOLIC PANEL     Status: Abnormal  Collection Time    09/25/13  3:48 PM      Result Value Ref Range   Sodium 141  137 - 147 mEq/L   Potassium 3.8  3.7 - 5.3 mEq/L   Chloride 102  96 - 112 mEq/L   CO2 26  19 - 32 mEq/L   Glucose, Bld 114 (*) 70 - 99 mg/dL   BUN 20  6 - 23 mg/dL   Creatinine, Ser 1.05  0.50 - 1.35 mg/dL   Calcium 9.3  8.4 - 10.5 mg/dL   Total Protein 7.6  6.0 - 8.3 g/dL   Albumin 3.4 (*) 3.5 - 5.2 g/dL   AST 22  0 - 37 U/L   ALT 13  0 - 53 U/L   Alkaline Phosphatase 93  39 - 117 U/L   Total Bilirubin 0.3  0.3 - 1.2 mg/dL   GFR calc non Af Amer 62 (*) >90 mL/min   GFR calc Af Amer 72 (*) >90 mL/min   Comment: (NOTE)     The eGFR has been calculated using the CKD EPI equation.     This calculation has not been validated in all clinical situations.     eGFR's persistently <90 mL/min signify possible Chronic Kidney     Disease.   Anion gap 13  5 - 15  APTT     Status: Abnormal   Collection Time    09/25/13  3:48 PM      Result Value Ref Range   aPTT 43 (*) 24 - 37 seconds   Comment:            IF BASELINE aPTT IS ELEVATED,     SUGGEST PATIENT RISK ASSESSMENT     BE USED TO DETERMINE APPROPRIATE      ANTICOAGULANT THERAPY.  PROTIME-INR     Status: Abnormal   Collection Time    09/25/13  3:48 PM      Result Value Ref Range   Prothrombin Time 26.2 (*) 11.6 - 15.2 seconds   INR 2.40 (*) 0.00 - 1.49  SAMPLE TO BLOOD BANK     Status: None   Collection Time    09/25/13  3:53 PM      Result Value Ref Range   Blood Bank Specimen SAMPLE AVAILABLE FOR TESTING     Sample Expiration 09/26/2013    URINALYSIS, ROUTINE W REFLEX MICROSCOPIC     Status: Abnormal   Collection Time    09/25/13  4:05 PM      Result Value Ref Range   Color, Urine YELLOW  YELLOW   APPearance CLOUDY (*) CLEAR   Specific Gravity, Urine 1.015  1.005 - 1.030   pH 8.5 (*) 5.0 - 8.0   Glucose, UA NEGATIVE  NEGATIVE mg/dL   Hgb urine dipstick NEGATIVE  NEGATIVE   Bilirubin Urine NEGATIVE  NEGATIVE   Ketones, ur NEGATIVE  NEGATIVE mg/dL   Protein, ur 30 (*) NEGATIVE mg/dL   Urobilinogen, UA 0.2  0.0 - 1.0 mg/dL   Nitrite NEGATIVE  NEGATIVE   Leukocytes, UA NEGATIVE  NEGATIVE  URINE MICROSCOPIC-ADD ON     Status: Abnormal   Collection Time    09/25/13  4:05 PM      Result Value Ref Range   WBC, UA 7-10  <3 WBC/hpf   RBC / HPF 3-6  <3 RBC/hpf   Bacteria, UA MANY (*) RARE   Urine-Other AMORPHOUS URATES/PHOSPHATES      Dg Chest 1 View  09/25/2013  CLINICAL DATA:  Fall  EXAM: CHEST - 1 VIEW  COMPARISON:  12/17/2011  FINDINGS: There is stable moderately enlarged cardiac silhouette. Left chest wall pacer device with a single lead projecting over the expected location of the right ventricular apex is stable. Hilar contours are stable. Pulmonary vascularity appears within limits. No focal airspace disease or evidence of pulmonary edema. No visible pleural effusion.  IMPRESSION: Stable moderate cardiomegaly and stable single lead pacer device. No acute superimposed abnormality identified.   Electronically Signed   By: Curlene Dolphin M.D.   On: 09/25/2013 15:27   Dg Hip Complete Right  09/25/2013   CLINICAL DATA:  Post fall,  now with right-sided hip pain  EXAM: RIGHT HIP - COMPLETE 2+ VIEW  COMPARISON:  None.  FINDINGS: There is a minimally displaced right-sided intertrochanteric femur fracture with associated foreshortening and accentuated varus angulation. No definitive intra-articular extension. No definite displaced pelvic fracture. Degenerative changes suspected within the lower lumbar spine though incompletely evaluated.  There are multiple presumed dermal and subcutaneous dystrophic calcifications about the buttocks bilaterally. Multiple phleboliths overlie the lower pelvis bilaterally. No definite radiopaque foreign body.  IMPRESSION: Acute minimally displaced right-sided intertrochanteric femur fracture with associated foreshortening and accentuated varus angulation.   Electronically Signed   By: Sandi Mariscal M.D.   On: 09/25/2013 15:24   Ct Head Wo Contrast  09/25/2013   CLINICAL DATA:  Fall, on Coumadin  EXAM: CT HEAD WITHOUT CONTRAST  TECHNIQUE: Contiguous axial images were obtained from the base of the skull through the vertex without intravenous contrast.  COMPARISON:  12/13/2011  FINDINGS: Generalized atrophy.  Normal ventricular morphology.  No midline shift or mass effect.  Extensive small vessel chronic ischemic changes of deep cerebral white matter.  No intracranial hemorrhage, mass lesion or evidence acute infarction.  No extra-axial fluid collections.  Atherosclerotic calcifications at skullbase.  Mucosal thickening in a few RIGHT ethmoid air cells.  Bones and sinuses otherwise unremarkable.  IMPRESSION: Atrophy with small vessel chronic ischemic changes of deep cerebral white matter.  No acute intracranial abnormalities.   Electronically Signed   By: Lavonia Dana M.D.   On: 09/25/2013 17:30    Review of Systems  Cardiovascular:       History of pacemaker.  He has been on Coumadin for some time.  He has had no problem with it.  He has hypertension.  He has atrial fibrillation.  He has history of mitral  stenosis.  Genitourinary:       History of prostate cancer. Incontinence of urine.  Musculoskeletal: Positive for back pain and falls (Fell at home today and hurt his right hip.  Lives with son and daughter-in-law.  Daughter-in-law present.  No other injury.).  Psychiatric/Behavioral: Positive for memory loss.       Disoriented as to time and place.  He knows his family members.   Blood pressure 148/78, pulse 62, temperature 97.4 F (36.3 C), temperature source Oral, resp. rate 15, height 5' 2"  (1.575 m), weight 54.432 kg (120 lb), SpO2 100.00%. Physical Exam  Constitutional: He appears well-developed and well-nourished.  HENT:  Head: Normocephalic and atraumatic.  Eyes: Conjunctivae and EOM are normal. Pupils are equal, round, and reactive to light.  Neck: Normal range of motion. Neck supple.  Cardiovascular: Intact distal pulses.   Respiratory: Effort normal.  Musculoskeletal: He exhibits tenderness (pain right hip.  He is laying on the right hip as he says it feels better.  No apparent leg length inequality in this position.  NV is intact.).  Neurological: He is alert. He has normal reflexes.  Disoriented as to place and time.  He knows family members.  Psychiatric: He has a normal mood and affect. His behavior is normal.  Disoriented.    Assessment/Plan: Intertrochanteric fracture of the right hip, nondisplaced. Pacemaker History of atrial fibrillation.  On Coumadin.  Will need to wait until INR is in acceptable range prior to any hip surgery. Dementia.   Atara Paterson 09/25/2013, 6:31 PM

## 2013-09-25 NOTE — H&P (Signed)
[facility] History and Physical  [name] OJJ:[id number] DOB: 06-Apr-1924 DOA: 09/25/2013  Referring physician: Rolland Porter, MD PCP: Leonides Grills, MD   Chief Complaint: Hip Fracture  HPI: [name] is a 80 y.o. male with a baseline history of dementia presented to the ED after taking a fall. The result is a fracture of his right hip. Patient is also on coumadin and according to the INR has been therapeutic. Orthopedics has seen the patient and would like to hold off on surgery until his INR is normal. Patient is not able to provide any history and there is no family present. Patient apparently tripped and did not lose consciousness. He did not have any other complaints.   Review of Systems:  He is not able to provide a ROS  Past Medical History  Diagnosis Date  . Rheumatic heart disease     moderate mitral stenosis and history of CHF  . Hypertension   . Sick sinus syndrome      Paroxysmal and then constant AF; slow rate on modest AV nodal blocking agents  . Chronic low back pain   . Atrial fibrillation     Per record has Afib/Aflutter with SSS  . PVD (peripheral vascular disease)     Right superficial femoral to peroneal artery bypass graft with nonreversed saphenous vein composite  2002  . Mesenteric ischemia     SMA artery embolectomy in 2002  . Dementia     mild/H&P (12/13/2011)  . Prostate cancer   . Shortness of breath     "@ night" (12/13/2011)  . Pacemaker    Past Surgical History  Procedure Laterality Date  . Transurethral resection of prostate    . Embolectomy  2002    SMA artery  . Femoral-peroneal bypass graft  2002    right superficial (12/13/2011)   Social History:  reports that he has never smoked. He has never used smokeless tobacco. He reports that he does not drink alcohol or use illicit drugs.  No Known Allergies  No family history on file.   Prior to Admission medications   Medication Sig Start Date End Date Taking? Authorizing  Provider  amLODipine (NORVASC) 5 MG tablet Take 1 tablet (5 mg total) by mouth daily. 12/14/12  Yes Evans Lance, MD  iron polysaccharides (NIFEREX) 150 MG capsule Take 150 mg by mouth daily.   Yes Historical Provider, MD  olmesartan (BENICAR) 40 MG tablet Take 40 mg by mouth daily.   Yes Historical Provider, MD  TRAVATAN Z 0.004 % SOLN ophthalmic solution Place 1 drop into both eyes at bedtime.  03/12/12  Yes Historical Provider, MD  warfarin (COUMADIN) 5 MG tablet Take 1 tablet (5 mg total) by mouth one time only at 6 PM. 05/18/13  Yes Evans Lance, MD  donepezil (ARICEPT) 5 MG tablet Take 5 mg by mouth daily. 09/17/13   Historical Provider, MD  olmesartan (BENICAR) 40 MG tablet Take 1 tablet (40 mg total) by mouth daily. 07/16/11 07/15/12  Lendon Colonel, NP  zolpidem (AMBIEN) 5 MG tablet Take 2.5 mg by mouth at bedtime as needed.    Historical Provider, MD   Physical Exam: Filed Vitals:   09/25/13 1749  BP: 148/78  Pulse: 62  Temp:   Resp: 15    BP 148/78  Pulse 62  Temp(Src) 97.4 F (36.3 C) (Oral)  Resp 15  Ht 5\' 2"  (1.575 m)  Wt 54.432 kg (120 lb)  BMI 21.94 kg/m2  SpO2 100%  General:  Appears calm Eyes: PERRL, normal lids, irises & conjunctiva ENT: grossly normal Neck: no LAD, masses or thyromegaly Cardiovascular: IRR, No LE edema. Telemetry: atrial fibrillation Respiratory: CTA bilaterally, no w/r/r. Normal respiratory effort. Abdomen: soft, ntnd Skin: no rash or induration seen on limited exam Musculoskeletal: RLE externally rotated and shortened Psychiatric: grossly normal mood and affect, speech fluent and appropriate Neurologic: grossly non-focal.          Labs on Admission:  Basic Metabolic Panel:  Recent Labs Lab 09/25/13 1548  NA 141  K 3.8  CL 102  CO2 26  GLUCOSE 114*  BUN 20  CREATININE 1.05  CALCIUM 9.3   Liver Function Tests:  Recent Labs Lab 09/25/13 1548  AST 22  ALT 13  ALKPHOS 93  BILITOT 0.3  PROT 7.6  ALBUMIN 3.4*   No  results found for this basename: LIPASE, AMYLASE,  in the last 168 hours No results found for this basename: AMMONIA,  in the last 168 hours CBC:  Recent Labs Lab 09/25/13 1548  WBC 4.0  NEUTROABS 2.3  HGB 13.4  HCT 40.3  MCV 85.0  PLT 161   Cardiac Enzymes: No results found for this basename: CKTOTAL, CKMB, CKMBINDEX, TROPONINI,  in the last 168 hours  BNP (last 3 results) No results found for this basename: PROBNP,  in the last 8760 hours CBG: No results found for this basename: GLUCAP,  in the last 168 hours  Radiological Exams on Admission: Dg Chest 1 View  09/25/2013   CLINICAL DATA:  Fall  EXAM: CHEST - 1 VIEW  COMPARISON:  12/17/2011  FINDINGS: There is stable moderately enlarged cardiac silhouette. Left chest wall pacer device with a single lead projecting over the expected location of the right ventricular apex is stable. Hilar contours are stable. Pulmonary vascularity appears within limits. No focal airspace disease or evidence of pulmonary edema. No visible pleural effusion.  IMPRESSION: Stable moderate cardiomegaly and stable single lead pacer device. No acute superimposed abnormality identified.   Electronically Signed   By: Curlene Dolphin M.D.   On: 09/25/2013 15:27   Dg Hip Complete Right  09/25/2013   CLINICAL DATA:  Post fall, now with right-sided hip pain  EXAM: RIGHT HIP - COMPLETE 2+ VIEW  COMPARISON:  None.  FINDINGS: There is a minimally displaced right-sided intertrochanteric femur fracture with associated foreshortening and accentuated varus angulation. No definitive intra-articular extension. No definite displaced pelvic fracture. Degenerative changes suspected within the lower lumbar spine though incompletely evaluated.  There are multiple presumed dermal and subcutaneous dystrophic calcifications about the buttocks bilaterally. Multiple phleboliths overlie the lower pelvis bilaterally. No definite radiopaque foreign body.  IMPRESSION: Acute minimally displaced  right-sided intertrochanteric femur fracture with associated foreshortening and accentuated varus angulation.   Electronically Signed   By: Sandi Mariscal M.D.   On: 09/25/2013 15:24   Ct Head Wo Contrast  09/25/2013   CLINICAL DATA:  Fall, on Coumadin  EXAM: CT HEAD WITHOUT CONTRAST  TECHNIQUE: Contiguous axial images were obtained from the base of the skull through the vertex without intravenous contrast.  COMPARISON:  12/13/2011  FINDINGS: Generalized atrophy.  Normal ventricular morphology.  No midline shift or mass effect.  Extensive small vessel chronic ischemic changes of deep cerebral white matter.  No intracranial hemorrhage, mass lesion or evidence acute infarction.  No extra-axial fluid collections.  Atherosclerotic calcifications at skullbase.  Mucosal thickening in a few RIGHT ethmoid air cells.  Bones and sinuses otherwise unremarkable.  IMPRESSION: Atrophy with small vessel chronic ischemic changes of deep cerebral white matter.  No acute intracranial abnormalities.   Electronically Signed   By: Lavonia Dana M.D.   On: 09/25/2013 17:30      Assessment/Plan Active Problems:   HYPERLIPIDEMIA   Atrial fibrillation   Encounter for long-term (current) use of anticoagulants   Dementia   Fracture, intertrochanteric, right femur   Hip fracture   1. Right Hip fracture -has been seen by Orthopedics and consult note appreciated. -will hold coumadin -start on enoxaprin -SCDs ordered -will continue with pain control  2. Atrial Fibrillation -currently he is rate controlled -will hold coumadin due to need for surgery  3. Chronic Anticoagulation -again holding coumadin  4. Dementia -continue with home medications  -right now appears to be at baseline  5. Hyperlipidemia -continue with home medications   Code Status: Full Code (must indicate code status--if unknown or must be presumed, indicate so) Family Communication: None (indicate person spoken with, if applicable, with phone  number if by telephone) Disposition Plan: SNF (indicate anticipated LOS)  Time spent: 12min  Tinea Nobile A [facility] Pager [phone]  **Disclaimer: This note may have been dictated with voice recognition software. Similar sounding words can inadvertently be transcribed and this note may contain transcription errors which may not have been corrected upon publication of note.**

## 2013-09-25 NOTE — ED Notes (Signed)
PT fell from a standing position in the kitchen and c/o right hip/leg pain.

## 2013-09-25 NOTE — ED Provider Notes (Signed)
CSN: [id number]     Arrival date & time 09/25/13  1427 History   First MD Initiated Contact with Patient 09/25/13 1521      This chart was scribed for Billy Norrie, MD by Forrestine Him, ED Scribe. This patient was seen in room APAH2/AP[facility]2 and the patient's care was started 3:29 PM.   Chief Complaint  Patient presents with  . Fall   The history is provided by a relative. No language interpreter was used.   Level V caveat for dementia  HPI Comments: [name] is a 80 y.o. Male with a PMHx of HTN, A-Fib, pacemaker,  PVD, and Dementria who presents to the Emergency Department complaining of a fall that occurred shortly prior to arrival. Son states he may have slipped on his bedroom slippers resulting in him landing on his R side. No known head trauma or LOC at time of fall. He now c/o constant, moderate R hip and R lower extremity pain. Pain is worsened with ambulation and bearing weight. No alleviating factors at this time. He denies any fever, chills, loss of sensation, or numbness. He is currently on Coumadin for A-Fib. Pt ate a sandwich right before fall occurred today without any difficulty. No other concerns this visit.  He is followed by Dr. Elsie Lincoln PCP [facility] [place]  Past Medical History  Diagnosis Date  . Rheumatic heart disease     moderate mitral stenosis and history of CHF  . Hypertension   . Sick sinus syndrome      Paroxysmal and then constant AF; slow rate on modest AV nodal blocking agents  . Chronic low back pain   . Atrial fibrillation     Per record has Afib/Aflutter with SSS  . PVD (peripheral vascular disease)     Right superficial femoral to peroneal artery bypass graft with nonreversed saphenous vein composite  2002  . Mesenteric ischemia     SMA artery embolectomy in 2002  . Dementia     mild/H&P (12/13/2011)  . Prostate cancer   . Shortness of breath     "@ night" (12/13/2011)  . Pacemaker    Past Surgical History  Procedure Laterality Date   . Transurethral resection of prostate    . Embolectomy  2002    SMA artery  . Femoral-peroneal bypass graft  2002    right superficial (12/13/2011)   No family history on file. History  Substance Use Topics  . Smoking status: Never Smoker   . Smokeless tobacco: Never Used  . Alcohol Use: No  lives at home Lives with his son  Review of Systems  Constitutional: Negative for fever and chills.  Musculoskeletal: Positive for arthralgias (R lower extremity and R hip).      Allergies  Review of patient's allergies indicates no known allergies.  Home Medications   Prior to Admission medications   Medication Sig Start Date End Date Taking? Authorizing Provider  amLODipine (NORVASC) 5 MG tablet Take 1 tablet (5 mg total) by mouth daily. 12/14/12  Yes Evans Lance, MD  iron polysaccharides (NIFEREX) 150 MG capsule Take 150 mg by mouth daily.   Yes Historical Provider, MD  olmesartan (BENICAR) 40 MG tablet Take 40 mg by mouth daily.   Yes Historical Provider, MD  TRAVATAN Z 0.004 % SOLN ophthalmic solution Place 1 drop into both eyes at bedtime.  03/12/12  Yes Historical Provider, MD  warfarin (COUMADIN) 5 MG tablet Take 1 tablet (5 mg total) by mouth one time  only at 6 PM. 05/18/13  Yes Evans Lance, MD  donepezil (ARICEPT) 5 MG tablet Take 5 mg by mouth daily. 09/17/13   Historical Provider, MD  olmesartan (BENICAR) 40 MG tablet Take 1 tablet (40 mg total) by mouth daily. 07/16/11 07/15/12  Lendon Colonel, NP  zolpidem (AMBIEN) 5 MG tablet Take 2.5 mg by mouth at bedtime as needed.    Historical Provider, MD   Triage Vitals: BP 136/77  Pulse 70  Temp(Src) 97.4 F (36.3 C) (Oral)  Resp 16  Ht 5\' 2"  (1.575 m)  Wt 120 lb (54.432 kg)  BMI 21.94 kg/m2  SpO2 94%   Vital signs normal    Physical Exam  Nursing note and vitals reviewed. Constitutional: He appears well-developed and well-nourished.  Non-toxic appearance. He does not appear ill. He appears distressed.  Very hard of  hearing  HENT:  Head: Normocephalic and atraumatic.  Right Ear: External ear normal.  Left Ear: External ear normal.  Nose: Nose normal. No mucosal edema or rhinorrhea.  Mouth/Throat: Oropharynx is clear and moist and mucous membranes are normal. No dental abscesses or uvula swelling.  Eyes: Conjunctivae and EOM are normal. Pupils are equal, round, and reactive to light.  Neck: Normal range of motion and full passive range of motion without pain. Neck supple.  Cardiovascular: Normal rate, regular rhythm and normal heart sounds.  Exam reveals no gallop and no friction rub.   No murmur heard. Pulmonary/Chest: Effort normal and breath sounds normal. No respiratory distress. He has no wheezes. He has no rhonchi. He has no rales. He exhibits no tenderness and no crepitus.  Abdominal: Soft. Normal appearance and bowel sounds are normal. He exhibits no distension. There is no tenderness. There is no rebound and no guarding.  Musculoskeletal: Normal range of motion. He exhibits tenderness. He exhibits no edema.  R leg shortened and externally rotated R knee is without swelling or deformity Patient keeps grabbing his right hip and moving his leg as if in pain  Neurological: He is alert. He has normal strength. No cranial nerve deficit.  Patient has dementia  Skin: Skin is warm, dry and intact. No rash noted. No erythema. No pallor.  Psychiatric: He has a normal mood and affect. His speech is normal and behavior is normal. His mood appears not anxious.    ED Course  Procedures (including critical care time)  Medications  0.9 %  sodium chloride infusion ( Intravenous New Bag/Given 09/25/13 1544)  cefTRIAXone (ROCEPHIN) 1 g in dextrose 5 % 50 mL IVPB (1 g Intravenous New Bag/Given 09/25/13 1805)  morphine 4 MG/ML injection 4 mg (4 mg Intravenous Given 09/25/13 1540)  ondansetron (ZOFRAN) injection 4 mg (4 mg Intravenous Given 09/25/13 1540)     DIAGNOSTIC STUDIES: Oxygen Saturation is 94% on RA,  adequate by my interpretation.    COORDINATION OF CARE: 3:32 PM- Will order DG Chest 1 view and DG hip Complete R. Discussed treatment plan with pt and family at bedside and family agreed to plan.    Patient was given pain medication for his fracture. After review of his laboratory tests he was given Rocephin for possible UTI.  17:58 Dr Luna Glasgow, made aware or patient with hip fracture  18:03 Dr Humphrey Rolls will see patient for admission   Labs Review Results for orders placed during the hospital encounter of 09/25/13  CBC WITH DIFFERENTIAL      Result Value Ref Range   WBC 4.0  4.0 - 10.5 K/uL  RBC 4.74  4.22 - 5.81 MIL/uL   Hemoglobin 13.4  13.0 - 17.0 g/dL   HCT 40.3  39.0 - 52.0 %   MCV 85.0  78.0 - 100.0 fL   MCH 28.3  26.0 - 34.0 pg   MCHC 33.3  30.0 - 36.0 g/dL   RDW 14.6  11.5 - 15.5 %   Platelets 161  150 - 400 K/uL   Neutrophils Relative % 55  43 - 77 %   Lymphocytes Relative 25  12 - 46 %   Monocytes Relative 18 (*) 3 - 12 %   Eosinophils Relative 1  0 - 5 %   Basophils Relative 1  0 - 1 %   Neutro Abs 2.3  1.7 - 7.7 K/uL   Lymphs Abs 1.0  0.7 - 4.0 K/uL   Monocytes Absolute 0.7  0.1 - 1.0 K/uL   Eosinophils Absolute 0.0  0.0 - 0.7 K/uL   Basophils Absolute 0.0  0.0 - 0.1 K/uL   WBC Morphology ATYPICAL LYMPHOCYTES    COMPREHENSIVE METABOLIC PANEL      Result Value Ref Range   Sodium 141  137 - 147 mEq/L   Potassium 3.8  3.7 - 5.3 mEq/L   Chloride 102  96 - 112 mEq/L   CO2 26  19 - 32 mEq/L   Glucose, Bld 114 (*) 70 - 99 mg/dL   BUN 20  6 - 23 mg/dL   Creatinine, Ser 1.05  0.50 - 1.35 mg/dL   Calcium 9.3  8.4 - 10.5 mg/dL   Total Protein 7.6  6.0 - 8.3 g/dL   Albumin 3.4 (*) 3.5 - 5.2 g/dL   AST 22  0 - 37 U/L   ALT 13  0 - 53 U/L   Alkaline Phosphatase 93  39 - 117 U/L   Total Bilirubin 0.3  0.3 - 1.2 mg/dL   GFR calc non Af Amer 62 (*) >90 mL/min   GFR calc Af Amer 72 (*) >90 mL/min   Anion gap 13  5 - 15  URINALYSIS, ROUTINE W REFLEX MICROSCOPIC       Result Value Ref Range   Color, Urine YELLOW  YELLOW   APPearance CLOUDY (*) CLEAR   Specific Gravity, Urine 1.015  1.005 - 1.030   pH 8.5 (*) 5.0 - 8.0   Glucose, UA NEGATIVE  NEGATIVE mg/dL   Hgb urine dipstick NEGATIVE  NEGATIVE   Bilirubin Urine NEGATIVE  NEGATIVE   Ketones, ur NEGATIVE  NEGATIVE mg/dL   Protein, ur 30 (*) NEGATIVE mg/dL   Urobilinogen, UA 0.2  0.0 - 1.0 mg/dL   Nitrite NEGATIVE  NEGATIVE   Leukocytes, UA NEGATIVE  NEGATIVE  APTT      Result Value Ref Range   aPTT 43 (*) 24 - 37 seconds  PROTIME-INR      Result Value Ref Range   Prothrombin Time 26.2 (*) 11.6 - 15.2 seconds   INR 2.40 (*) 0.00 - 1.49  URINE MICROSCOPIC-ADD ON      Result Value Ref Range   WBC, UA 7-10  <3 WBC/hpf   RBC / HPF 3-6  <3 RBC/hpf   Bacteria, UA MANY (*) RARE   Urine-Other AMORPHOUS URATES/PHOSPHATES    SAMPLE TO BLOOD BANK      Result Value Ref Range   Blood Bank Specimen SAMPLE AVAILABLE FOR TESTING     Sample Expiration 09/26/2013     Laboratory interpretation all normal except therapeutic INR, possible UTI  Imaging Review Dg Chest 1 View  09/25/2013   CLINICAL DATA:  Fall  EXAM: CHEST - 1 VIEW  COMPARISON:  12/17/2011  FINDINGS: There is stable moderately enlarged cardiac silhouette. Left chest wall pacer device with a single lead projecting over the expected location of the right ventricular apex is stable. Hilar contours are stable. Pulmonary vascularity appears within limits. No focal airspace disease or evidence of pulmonary edema. No visible pleural effusion.  IMPRESSION: Stable moderate cardiomegaly and stable single lead pacer device. No acute superimposed abnormality identified.   Electronically Signed   By: Curlene Dolphin M.D.   On: 09/25/2013 15:27   Dg Hip Complete Right  09/25/2013   CLINICAL DATA:  Post fall, now with right-sided hip pain  EXAM: RIGHT HIP - COMPLETE 2+ VIEW  COMPARISON:  None.  FINDINGS: There is a minimally displaced right-sided intertrochanteric  femur fracture with associated foreshortening and accentuated varus angulation. No definitive intra-articular extension. No definite displaced pelvic fracture. Degenerative changes suspected within the lower lumbar spine though incompletely evaluated.  There are multiple presumed dermal and subcutaneous dystrophic calcifications about the buttocks bilaterally. Multiple phleboliths overlie the lower pelvis bilaterally. No definite radiopaque foreign body.  IMPRESSION: Acute minimally displaced right-sided intertrochanteric femur fracture with associated foreshortening and accentuated varus angulation.   Electronically Signed   By: Sandi Mariscal M.D.   On: 09/25/2013 15:24    Ct Head Wo Contrast  09/25/2013   CLINICAL DATA:  Fall, on Coumadin  EXAM: CT HEAD WITHOUT CONTRAST  TECHNIQUE: Contiguous axial images were obtained from the base of the skull through the vertex without intravenous contrast.  COMPARISON:  12/13/2011  FINDINGS: Generalized atrophy.  Normal ventricular morphology.  No midline shift or mass effect.  Extensive small vessel chronic ischemic changes of deep cerebral white matter.  No intracranial hemorrhage, mass lesion or evidence acute infarction.  No extra-axial fluid collections.  Atherosclerotic calcifications at skullbase.  Mucosal thickening in a few RIGHT ethmoid air cells.  Bones and sinuses otherwise unremarkable.  IMPRESSION: Atrophy with small vessel chronic ischemic changes of deep cerebral white matter.  No acute intracranial abnormalities.   Electronically Signed   By: Lavonia Dana M.D.   On: 09/25/2013 17:30     EKG Interpretation   Date/Time:  Saturday September 25 2013 15:54:40 EDT Ventricular Rate:  75 PR Interval:    QRS Duration: 166 QT Interval:  470 QTC Calculation: 524 R Axis:   -75 Text Interpretation:  Ventricular-paced rhythm Since last tracing rate  faster Confirmed by Shaili Donalson  MD-I, Elverna Caffee ([id number]) on 09/25/2013 4:42:51 PM      MDM   Final diagnoses:   Intertrochanteric fracture of right hip, closed, initial encounter  Urinary tract infection without hematuria, site unspecified  Warfarin-induced coagulopathy   Plan admission   Rolland Porter, MD, FACEP   I personally performed the services described in this documentation, which was scribed in my presence. The recorded information has been reviewed and considered.  Rolland Porter, MD, FACEP     Billy Norrie, MD 09/25/13 Vernelle Emerald

## 2013-09-25 NOTE — Progress Notes (Signed)
ANTICOAGULATION CONSULT NOTE - Initial Consult  Pharmacy Consult for Lovenox Indication: VTE prophylaxis  No Known Allergies  Patient Measurements: Height: 5\' 2"  (157.5 cm) Weight: 120 lb (54.432 kg) IBW/kg (Calculated) : 54.6 Heparin Dosing Weight:   Vital Signs: Temp: 97.4 F (36.3 C) (07/11 1443) Temp src: Temporal (07/11 1922) BP: 149/81 mmHg (07/11 1922) Pulse Rate: 60 (07/11 1922)  Labs:  Recent Labs  09/23/13 1005 09/25/13 1548  HGB  --  13.4  HCT  --  40.3  PLT  --  161  APTT  --  43*  LABPROT  --  26.2*  INR 3.0 2.40*  CREATININE  --  1.05    Estimated Creatinine Clearance: 38.9 ml/min (by C-G formula based on Cr of 1.05).   Medical History: Past Medical History  Diagnosis Date  . Rheumatic heart disease     moderate mitral stenosis and history of CHF  . Hypertension   . Sick sinus syndrome      Paroxysmal and then constant AF; slow rate on modest AV nodal blocking agents  . Chronic low back pain   . Atrial fibrillation     Per record has Afib/Aflutter with SSS  . PVD (peripheral vascular disease)     Right superficial femoral to peroneal artery bypass graft with nonreversed saphenous vein composite  2002  . Mesenteric ischemia     SMA artery embolectomy in 2002  . Dementia     mild/H&P (12/13/2011)  . Prostate cancer   . Shortness of breath     "@ night" (12/13/2011)  . Pacemaker     Medications:  Scheduled:  . [START ON 09/26/2013] amLODipine  5 mg Oral Daily  . docusate sodium  100 mg Oral BID  . donepezil  5 mg Oral Daily  . [START ON 09/26/2013] irbesartan  300 mg Oral Daily  . [START ON 09/26/2013] iron polysaccharides  150 mg Oral Daily  . latanoprost  1 drop Both Eyes QHS    Assessment: Patient on Coumadin for AFIB, INR 2.40 therapeutic on admission Admitted for hip fracture, surgery delayed until INR acceptable for ortho Pharmacy consult for Lovenox for DVT prophylaxis  Goal of Therapy:  DVT prophylaxis dosing Monitor  platelets by anticoagulation protocol: Yes   Plan:  INR/PT in AM Start Lovenox 40 mg SQ every 24 hours when INR < 2.0 CBC, monitor platelets  Abner Greenspan, Gracelee Stemmler Bennett 09/25/2013,9:31 PM

## 2013-09-26 ENCOUNTER — Inpatient Hospital Stay (HOSPITAL_COMMUNITY): Payer: Medicare HMO

## 2013-09-26 DIAGNOSIS — I4891 Unspecified atrial fibrillation: Secondary | ICD-10-CM

## 2013-09-26 DIAGNOSIS — Z7901 Long term (current) use of anticoagulants: Secondary | ICD-10-CM

## 2013-09-26 DIAGNOSIS — F0390 Unspecified dementia without behavioral disturbance: Secondary | ICD-10-CM

## 2013-09-26 LAB — BASIC METABOLIC PANEL
ANION GAP: 11 (ref 5–15)
BUN: 15 mg/dL (ref 6–23)
CHLORIDE: 102 meq/L (ref 96–112)
CO2: 26 mEq/L (ref 19–32)
CREATININE: 0.85 mg/dL (ref 0.50–1.35)
Calcium: 9 mg/dL (ref 8.4–10.5)
GFR calc Af Amer: 89 mL/min — ABNORMAL LOW (ref >90)
GFR calc non Af Amer: 77 mL/min — ABNORMAL LOW (ref >90)
GLUCOSE: 102 mg/dL — AB (ref 70–99)
Potassium: 4.4 mEq/L (ref 3.7–5.3)
Sodium: 139 mEq/L (ref 137–147)

## 2013-09-26 LAB — CBC
HCT: 40 % (ref 39.0–52.0)
HEMOGLOBIN: 13.4 g/dL (ref 13.0–17.0)
MCH: 28.5 pg (ref 26.0–34.0)
MCHC: 33.5 g/dL (ref 30.0–36.0)
MCV: 85.1 fL (ref 78.0–100.0)
Platelets: 159 10*3/uL (ref 150–400)
RBC: 4.7 MIL/uL (ref 4.22–5.81)
RDW: 14.5 % (ref 11.5–15.5)
WBC: 6 10*3/uL (ref 4.0–10.5)

## 2013-09-26 LAB — PROTIME-INR
INR: 2.46 — ABNORMAL HIGH (ref 0.00–1.49)
INR: 2.91 — AB (ref 0.00–1.49)
Prothrombin Time: 26.7 seconds — ABNORMAL HIGH (ref 11.6–15.2)
Prothrombin Time: 30.4 seconds — ABNORMAL HIGH (ref 11.6–15.2)

## 2013-09-26 MED ORDER — ACETAMINOPHEN 325 MG PO TABS: 650.0000 mg | ORAL_TABLET | Freq: Four times a day (QID) | ORAL | Status: DC | PRN

## 2013-09-26 NOTE — Progress Notes (Signed)
Utilization review Completed Maxximus Gotay RN BSN   

## 2013-09-26 NOTE — Progress Notes (Signed)
Subjective: I am OK.  He is confused pleasantly.   Objective: Vital signs in last 24 hours: Temp:  [97.4 F (36.3 C)-98 F (36.7 C)] 98 F (36.7 C) (07/12 0657) Pulse Rate:  [60-70] 60 (07/12 0657) Resp:  [13-22] 13 (07/12 0657) BP: (136-152)/(62-81) 152/79 mmHg (07/12 0657) SpO2:  [94 %-100 %] 100 % (07/12 0657) Weight:  [54.432 kg (120 lb)-55.974 kg (123 lb 6.4 oz)] 55.974 kg (123 lb 6.4 oz) (07/11 2155)  Intake/Output from previous day: 07/11 0701 - 07/12 0700 In: -  Out: 1000 [Urine:1000] Intake/Output this shift:     Recent Labs  09/25/13 1548 09/26/13 0603  HGB 13.4 13.4    Recent Labs  09/25/13 1548 09/26/13 0603  WBC 4.0 6.0  RBC 4.74 4.70  HCT 40.3 40.0  PLT 161 159    Recent Labs  09/25/13 1548 09/26/13 0603  NA 141 139  K 3.8 4.4  CL 102 102  CO2 26 26  BUN 20 15  CREATININE 1.05 0.85  GLUCOSE 114* 102*  CALCIUM 9.3 9.0    Recent Labs  09/25/13 1548 09/26/13 0803  INR 2.40* 2.46*    Neurovascular intact Sensation intact distally Intact pulses distally Dorsiflexion/Plantar flexion intact  INR still elevated.  Will wait for its return to normal before surgery.  I have discussed this with the patient's son who is present this afternoon.  He understands. He also understands his father is at increased risk as well.  Assessment/Plan: Intertrochanteric fracture of hip.  Await medical clearance.   Americus Perkey 09/26/2013, 12:22 PM

## 2013-09-26 NOTE — Progress Notes (Signed)
  PROGRESS NOTE  [name] GDJ:[id number] DOB: [birth year] DOA: 09/25/2013 PCP: Leonides Grills, MD  Summary: 80 year old history of dementia who sustained a mechanical fall resulting in right hip fracture.  Assessment/Plan: 1. Right hip fracture. Management per orthopedics. Pain appears controlled. 2. Possible UTI. Followup culture. 3. Atrial fibrillation. Appears stable. Warfarin on hold. 4. History of rheumatic heart disease with aortic insufficiency and mitral stenosis 5. History of suspected LA thrombus 11/2011 6. 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 Dementia    Operative intervention planned per orthopedics. Relatively high-risk for surgery based on age and chronic comorbidities but no further evaluation suggested. Suggest heparin bridge needed and history as above.  Continue pain control.  Discussed in detail with daughter-in-law at bedside.  Code Status: full code DVT prophylaxis: INR >2, when <2, heparin bridge Family Communication:  Disposition Plan: likely SNF  Murray Hodgkins, MD  [facility]  Pager [phone] If 7PM-7AM, please contact night-coverage at [email or web address], password [facility] 09/26/2013, 2:36 PM  LOS: 1 day   Consultants:  Orthopedics  Procedures:    Antibiotics:    HPI/Subjective: No complaints. Dementia makes history unreliable. Daughter-in-law and daughter at bedside.  Objective: Filed Vitals:   09/25/13 1749 09/25/13 1922 09/25/13 2155 09/26/13 0657  BP: 148/78 149/81 147/62 152/79  Pulse: 62 60 62 60  Temp:   97.9 F (36.6 C) 98 F (36.7 C)  TempSrc:  Temporal Oral Oral  Resp: 15 22 14 13   Height:   5\' 2"  (1.575 m)   Weight:   55.974 kg (123 lb 6.4 oz)   SpO2: 100% 100% 100% 100%    Intake/Output Summary (Last 24 hours) at 09/26/13 1436 Last data filed at 09/26/13 1343  Gross per 24 hour    Intake    480 ml  Output   1000 ml  Net   -520 ml     Filed Weights   09/25/13 1443 09/25/13 2155  Weight: 54.432 kg (120 lb) 55.974 kg (123 lb 6.4 oz)    Exam:     Afebrile, vital signs stable. No hypoxia. Gen. Alert, calm. Well-appearing. Pleasantly confused.  Psych. Alert. Speech fluent and clear.  Cardiovascular. Regular rate and rhythm. No murmur, rub or gallop. No lower extremity edema.  Respiratory. Clear to auscultation bilaterally. No wheezes, rales rhonchi. Normal respiratory effort.  Data Reviewed:  Chemistry: Basic metabolic panel unremarkable.  Heme: CBC within normal limits. INR without significant change, 2.46.   ID: Urinalysis was equivocal. Culture pending.  Other: EKG ventricular paced rhythm. 2-D echocardiogram September 2013: LVEF 55-60%. Normal wall motion.  Imaging: CT head no acute abnormalities. Chest x-ray no acute abnormalities. Right hip fracture confirmed by x-ray.  Scheduled Meds: . amLODipine  5 mg Oral Daily  . docusate sodium  100 mg Oral BID  . donepezil  5 mg Oral Daily  . irbesartan  300 mg Oral Daily  . iron polysaccharides  150 mg Oral Daily  . latanoprost  1 drop Both Eyes QHS   Continuous Infusions: . sodium chloride 100 mL/hr at 09/25/13 1544  . sodium chloride 50 mL/hr at 09/26/13 8921    Active Problems:   HYPERLIPIDEMIA   Atrial fibrillation   Encounter for long-term (current) use of anticoagulants   Dementia   Fracture, intertrochanteric, right femur   Hip fracture   Time spent 25 minutes

## 2013-09-26 NOTE — Progress Notes (Signed)
INITIAL NUTRITION ASSESSMENT  INTERVENTION: Ensure Complete po BID, each supplement provides 350 kcal and 13 grams of protein    ProStat 30 ml TID (each 30 ml provides 100 kcal, 15 gr protein)   Will follow for nutrition care  NUTRITION DIAGNOSIS: Increased nutrient needs related to acute injury as evidenced by right hip fx pending surgical repair and est protein-energy needs.   Goal: Pt to meet >/= 90% of their estimated nutrition needs     Monitor: Po intake, labs and wt trends    Reason for Assessment: consult to assess nutrition status and requirements  80 y.o. male  ASSESSMENT: Pt hx  includes dementia, rheumatic heart disease, and PVD.  Pt has right hip fx. He has not been cleared for surgery due to increased INR. He is within usual body weight range however he is at increased nutrition risk related to advanced age, dementia and hip fx. Plan to supplement protein and micronutrients pending surgical intervention.  Height: Ht Readings from Last 1 Encounters:  09/25/13 5\' 2"  (1.575 m)    Weight: Wt Readings from Last 1 Encounters:  09/25/13 123 lb 6.4 oz (55.974 kg)    Ideal Body Weight: 118#  % Ideal Body Weight: 104%  Wt Readings from Last 10 Encounters:  09/25/13 123 lb 6.4 oz (55.974 kg)  12/14/12 133 lb 12 oz (60.669 kg)  06/15/12 119 lb (53.978 kg)  01/23/12 115 lb 12.8 oz (52.527 kg)  12/20/11 110 lb (49.896 kg)  12/20/11 110 lb (49.896 kg)  12/09/11 114 lb 10.2 oz (52 kg)  07/16/11 108 lb (48.988 kg)  06/27/11 112 lb (50.803 kg)  10/30/09 118 lb (53.524 kg)    Usual Body Weight: 120-130#  % Usual Body Weight: 100%  BMI:  Body mass index is 22.56 kg/(m^2). normal range  Estimated Nutritional Needs: Kcal: 1700-2000 Protein: 73-84 gr Fluid: >1700 ml daily  Skin: intact  Diet Order: Carb Control (po 75-100% meals)  EDUCATION NEEDS: -No education needs identified at this time   Intake/Output Summary (Last 24 hours) at 09/26/13 1642 Last  data filed at 09/26/13 1343  Gross per 24 hour  Intake    480 ml  Output   1000 ml  Net   -520 ml    Last BM: PTA  Labs:   Recent Labs Lab 09/25/13 1548 09/26/13 0603  NA 141 139  K 3.8 4.4  CL 102 102  CO2 26 26  BUN 20 15  CREATININE 1.05 0.85  CALCIUM 9.3 9.0  GLUCOSE 114* 102*    CBG (last 3)  No results found for this basename: GLUCAP,  in the last 72 hours  Scheduled Meds: . amLODipine  5 mg Oral Daily  . docusate sodium  100 mg Oral BID  . donepezil  5 mg Oral Daily  . irbesartan  300 mg Oral Daily  . iron polysaccharides  150 mg Oral Daily  . latanoprost  1 drop Both Eyes QHS    Continuous Infusions:   Past Medical History  Diagnosis Date  . Rheumatic heart disease     moderate mitral stenosis and history of CHF  . Hypertension   . Sick sinus syndrome      Paroxysmal and then constant AF; slow rate on modest AV nodal blocking agents  . Chronic low back pain   . Atrial fibrillation     Per record has Afib/Aflutter with SSS  . PVD (peripheral vascular disease)     Right superficial femoral to peroneal artery  bypass graft with nonreversed saphenous vein composite  2002  . Mesenteric ischemia     SMA artery embolectomy in 2002  . Dementia     mild/H&P (12/13/2011)  . Prostate cancer   . Shortness of breath     "@ night" (12/13/2011)  . Pacemaker     Past Surgical History  Procedure Laterality Date  . Transurethral resection of prostate    . Embolectomy  2002    SMA artery  . Femoral-peroneal bypass graft  2002    right superficial (12/13/2011)    Colman Cater MS,RD,CSG,LDN Office: [phone] Pager: [phone]

## 2013-09-27 DIAGNOSIS — D689 Coagulation defect, unspecified: Secondary | ICD-10-CM

## 2013-09-27 DIAGNOSIS — T45515A Adverse effect of anticoagulants, initial encounter: Secondary | ICD-10-CM

## 2013-09-27 LAB — PROTIME-INR
INR: 2.28 — AB (ref 0.00–1.49)
Prothrombin Time: 25.1 seconds — ABNORMAL HIGH (ref 11.6–15.2)

## 2013-09-27 LAB — GLUCOSE, CAPILLARY: GLUCOSE-CAPILLARY: 123 mg/dL — AB (ref 70–99)

## 2013-09-27 LAB — URINE CULTURE
Colony Count: NO GROWTH
Culture: NO GROWTH

## 2013-09-27 NOTE — Progress Notes (Signed)
  PROGRESS NOTE  [name] HWE:[id number] DOB: 08-10-1924 DOA: 09/25/2013 PCP: Leonides Grills, MD  Summary: 80 year old history of dementia who sustained a mechanical fall resulting in right hip fracture.  Assessment/Plan: 1. Right hip fracture. Appears stable. Management per orthopedics 2. Possible UTI. Culture pending. 3. Atrial fibrillation. Stable. Warfarin on hold. 4. History of rheumatic heart disease with aortic insufficiency and mitral stenosis 5. History of suspected LA thrombus 11/2011 6. 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 Dementia    Remains clinically stable.  Operative intervention planned per orthopedics. Relatively high-risk for surgery based on age and chronic comorbidities but no further evaluation suggested. Suggest heparin bridge when INR less than 2 based on history as above.  Continue pain control.  Code Status: full code DVT prophylaxis: INR >2, when <2, heparin bridge Family Communication:  Disposition Plan: likely SNF  Murray Hodgkins, MD  [facility]  Pager [phone] If 7PM-7AM, please contact night-coverage at [email or web address], password [facility] 09/27/2013, 2:25 PM  LOS: 2 days   Consultants:  Orthopedics  Procedures:    Antibiotics:    HPI/Subjective: Seems to be doing okay. History unreliable.  Objective: Filed Vitals:   09/26/13 0657 09/26/13 1530 09/26/13 2324 09/27/13 0500  BP: 152/79 141/77 138/68 151/72  Pulse: 60 59 64 62  Temp: 98 F (36.7 C) 98.2 F (36.8 C) 99 F (37.2 C) 98 F (36.7 C)  TempSrc: Oral Oral Oral Oral  Resp: 13 14 20 20   Height:      Weight:      SpO2: 100% 100% 100% 95%    Intake/Output Summary (Last 24 hours) at 09/27/13 1425 Last data filed at 09/27/13 0800  Gross per 24 hour  Intake    480 ml  Output   1200 ml  Net   -720 ml     Filed Weights   09/25/13 1443 09/25/13 2155  Weight: 54.432 kg (120 lb) 55.974 kg (123 lb 6.4 oz)    Exam:     Afebrile, vitals are stable. No hypoxia. Gen. He appears calm and comfortable. Eating lunch. Psych. Pleasantly confused. Alert. Speech fluent and clear. Cardiovascular. Regular rate and rhythm. No  rub or gallop. Loud murmur 3/6. No lower extremity edema. Respiratory. Clear to auscultation bilaterally. No wheezes, rales or rhonchi. Normal respiratory effort.  Data Reviewed:  Heme: INR 2.28  ID: Urinalysis was equivocal. Culture pending.  Scheduled Meds: . amLODipine  5 mg Oral Daily  . docusate sodium  100 mg Oral BID  . donepezil  5 mg Oral Daily  . irbesartan  300 mg Oral Daily  . iron polysaccharides  150 mg Oral Daily  . latanoprost  1 drop Both Eyes QHS   Continuous Infusions:    Active Problems:   HYPERLIPIDEMIA   Atrial fibrillation   Encounter for long-term (current) use of anticoagulants   Dementia   Fracture, intertrochanteric, right femur   Hip fracture   Time spent 15 minutes

## 2013-09-27 NOTE — Progress Notes (Signed)
Subjective: My hip hurts some.   Objective: Vital signs in last 24 hours: Temp:  [98 F (36.7 C)-99 F (37.2 C)] 98 F (36.7 C) (07/13 0500) Pulse Rate:  [59-64] 62 (07/13 0500) Resp:  [14-20] 20 (07/13 0500) BP: (138-151)/(68-77) 151/72 mmHg (07/13 0500) SpO2:  [95 %-100 %] 95 % (07/13 0500)  Intake/Output from previous day: 07/12 0701 - 07/13 0700 In: 720 [P.O.:720] Out: 1200 [Urine:1200] Intake/Output this shift:     Recent Labs  09/25/13 1548 09/26/13 0603  HGB 13.4 13.4    Recent Labs  09/25/13 1548 09/26/13 0603  WBC 4.0 6.0  RBC 4.74 4.70  HCT 40.3 40.0  PLT 161 159    Recent Labs  09/25/13 1548 09/26/13 0603  NA 141 139  K 3.8 4.4  CL 102 102  CO2 26 26  BUN 20 15  CREATININE 1.05 0.85  GLUCOSE 114* 102*  CALCIUM 9.3 9.0    Recent Labs  09/26/13 1932 09/27/13 0527  INR 2.91* 2.28*    Neurovascular intact Sensation intact distally Intact pulses distally Dorsiflexion/Plantar flexion intact  INR is down to 2.28.  Await its return to normal before any surgery.  Assessment/Plan: Right hip intertrochanteric fracture.   [name] 09/27/2013, 7:06 AM

## 2013-09-28 LAB — PROTIME-INR
INR: 1.98 — AB (ref 0.00–1.49)
PROTHROMBIN TIME: 22.5 s — AB (ref 11.6–15.2)

## 2013-09-28 LAB — CBC
HCT: 36.6 % — ABNORMAL LOW (ref 39.0–52.0)
Hemoglobin: 12.4 g/dL — ABNORMAL LOW (ref 13.0–17.0)
MCH: 28.6 pg (ref 26.0–34.0)
MCHC: 33.9 g/dL (ref 30.0–36.0)
MCV: 84.5 fL (ref 78.0–100.0)
PLATELETS: 167 10*3/uL (ref 150–400)
RBC: 4.33 MIL/uL (ref 4.22–5.81)
RDW: 14.4 % (ref 11.5–15.5)
WBC: 7.1 10*3/uL (ref 4.0–10.5)

## 2013-09-28 LAB — GLUCOSE, CAPILLARY: Glucose-Capillary: 123 mg/dL — ABNORMAL HIGH (ref 70–99)

## 2013-09-28 LAB — HEPARIN LEVEL (UNFRACTIONATED): Heparin Unfractionated: 0.29 IU/mL — ABNORMAL LOW (ref 0.30–0.70)

## 2013-09-28 MED ORDER — HEPARIN (PORCINE) IN NACL 100-0.45 UNIT/ML-% IJ SOLN
850.0000 [IU]/h | INTRAMUSCULAR | Status: AC
  Administered 2013-09-28: 800 [IU]/h via INTRAVENOUS
  Filled 2013-09-28: qty 250

## 2013-09-28 NOTE — Progress Notes (Signed)
PROGRESS NOTE  [name] IOX:[id number] DOB: Jan 10, 1925 DOA: 09/25/2013 PCP: Leonides Grills, MD  HPI: Admitted 7/11 post fall with R hip fracture.   Subjective: - confused, alert, not sure why he is here. Complains of hip pain.   Past 24 h - INR < 2 this morning, initiate heparin gtt  Assessment/Plan: Right hip fracture. Appears stable. Management per orthopedics  Possible UTI. Culture pending.  Atrial fibrillation. Stable. Warfarin on hold, start heparin. 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, rheumatic heart disease,  Sick sinus syndrome status post pacemaker placement  Dementia    Diet: carb modified Fluids: none DVT Prophylaxis: heparin  Code Status: Full Family Communication: none this morning  Disposition Plan: inpatient  Consultants:  Orthopedic surgery   Procedures:  None    Antibiotics Ceftriaxone 7/11 x 1 dose  Objective: Filed Vitals:   09/27/13 0500 09/27/13 1400 09/27/13 2137 09/28/13 0619  BP: 151/72 146/78 160/84 139/74  Pulse: 62 63 60 64  Temp: 98 F (36.7 C) 98.1 F (36.7 C) 98 F (36.7 C) 97.8 F (36.6 C)  TempSrc: Oral Oral Oral Oral  Resp: 20 20 20 20   Height:      Weight:      SpO2: 95% 98% 100% 100%    Intake/Output Summary (Last 24 hours) at 09/28/13 1342 Last data filed at 09/28/13 1245  Gross per 24 hour  Intake    600 ml  Output    700 ml  Net   -100 ml   Filed Weights   09/25/13 1443 09/25/13 2155  Weight: 54.432 kg (120 lb) 55.974 kg (123 lb 6.4 oz)   Exam:  General:  NAD  Cardiovascular: regular rate and rhythm, without MRG  Respiratory: good air movement, clear to auscultation throughout, no wheezing, ronchi or rales  Abdomen: soft, not tender to palpation, positive bowel sounds  MSK: no peripheral edema  Data  Reviewed: Basic Metabolic Panel:  Recent Labs Lab 09/25/13 1548 09/26/13 0603  NA 141 139  K 3.8 4.4  CL 102 102  CO2 26 26  GLUCOSE 114* 102*  BUN 20 15  CREATININE 1.05 0.85  CALCIUM 9.3 9.0   Liver Function Tests:  Recent Labs Lab 09/25/13 1548  AST 22  ALT 13  ALKPHOS 93  BILITOT 0.3  PROT 7.6  ALBUMIN 3.4*   CBC:  Recent Labs Lab 09/25/13 1548 09/26/13 0603 09/28/13 0629  WBC 4.0 6.0 7.1  NEUTROABS 2.3  --   --   HGB 13.4 13.4 12.4*  HCT 40.3 40.0 36.6*  MCV 85.0 85.1 84.5  PLT 161 159 167   CBG:  Recent Labs Lab 09/27/13 2141  GLUCAP 123*    Recent Results (from the past 240 hour(s))  URINE CULTURE     Status: None   Collection Time    09/25/13  4:05 PM      Result Value Ref Range Status   Specimen Description URINE, CATHETERIZED   Final   Special Requests NONE   Final   Culture  Setup Time     Final   Value: 09/26/2013 19:49     Performed at [facility] Count     Final   Value: NO GROWTH     Performed at [facility]   Culture     Final   Value:  NO GROWTH     Performed at [facility]   Report Status 09/27/2013 FINAL   Final     Studies: No results found.  Scheduled Meds: . amLODipine  5 mg Oral Daily  . docusate sodium  100 mg Oral BID  . donepezil  5 mg Oral Daily  . irbesartan  300 mg Oral Daily  . iron polysaccharides  150 mg Oral Daily  . latanoprost  1 drop Both Eyes QHS   Continuous Infusions:   Active Problems:   HYPERLIPIDEMIA   Atrial fibrillation   Encounter for long-term (current) use of anticoagulants   Dementia   Fracture, intertrochanteric, right femur   Hip fracture   Time spent: 25  This note has been created with Surveyor, quantity. Any transcriptional errors are unintentional.   Marzetta Board, MD [facility] Pager [phone]. If 7 PM - 7 AM, please contact night-coverage at [email or web address], password  [facility] 09/28/2013, 1:42 PM  LOS: 3 days

## 2013-09-28 NOTE — Progress Notes (Signed)
ANTICOAGULATION CONSULT NOTE - follow up  Pharmacy Consult for Heparin Indication: atrial fibrillation, bridge for surgery in pt on Coumadin PTA  No Known Allergies  Patient Measurements: Height: 5\' 2"  (157.5 cm) Weight: 123 lb 6.4 oz (55.974 kg) IBW/kg (Calculated) : 54.6  Vital Signs: Temp: 98 F (36.7 C) (07/14 1449) Temp src: Oral (07/14 1449) BP: 153/90 mmHg (07/14 1449) Pulse Rate: 59 (07/14 1449)  Labs:  Recent Labs  09/26/13 0603  09/26/13 1932 09/27/13 0527 09/28/13 0629 09/28/13 2028  HGB 13.4  --   --   --  12.4*  --   HCT 40.0  --   --   --  36.6*  --   PLT 159  --   --   --  167  --   LABPROT  --   < > 30.4* 25.1* 22.5*  --   INR  --   < > 2.91* 2.28* 1.98*  --   HEPARINUNFRC  --   --   --   --   --  0.29*  CREATININE 0.85  --   --   --   --   --   < > = values in this interval not displayed.  Estimated Creatinine Clearance: 48.2 ml/min (by C-G formula based on Cr of 0.85).  Medical History: Past Medical History  Diagnosis Date  . Rheumatic heart disease     moderate mitral stenosis and history of CHF  . Hypertension   . Sick sinus syndrome      Paroxysmal and then constant AF; slow rate on modest AV nodal blocking agents  . Chronic low back pain   . Atrial fibrillation     Per record has Afib/Aflutter with SSS  . PVD (peripheral vascular disease)     Right superficial femoral to peroneal artery bypass graft with nonreversed saphenous vein composite  2002  . Mesenteric ischemia     SMA artery embolectomy in 2002  . Dementia     mild/H&P (12/13/2011)  . Prostate cancer   . Shortness of breath     "@ night" (12/13/2011)  . Pacemaker    Medications:  Prescriptions prior to admission  Medication Sig Dispense Refill  . amLODipine (NORVASC) 5 MG tablet Take 1 tablet (5 mg total) by mouth daily.  90 tablet  3  . iron polysaccharides (NIFEREX) 150 MG capsule Take 150 mg by mouth daily.      Marland Kitchen olmesartan (BENICAR) 40 MG tablet Take 40 mg by mouth  daily.      . TRAVATAN Z 0.004 % SOLN ophthalmic solution Place 1 drop into both eyes at bedtime.       Marland Kitchen warfarin (COUMADIN) 5 MG tablet Take 1 tablet (5 mg total) by mouth one time only at 6 PM.  30 tablet  3  . donepezil (ARICEPT) 5 MG tablet Take 5 mg by mouth daily.      Marland Kitchen olmesartan (BENICAR) 40 MG tablet Take 1 tablet (40 mg total) by mouth daily.  30 tablet  6  . zolpidem (AMBIEN) 5 MG tablet Take 2.5 mg by mouth at bedtime as needed.       Assessment: 80yo male admitted with hip fx.  INR now < 2.  Asked to initiate IV Heparin as bridge until pt can have surgery (tentatively planned for Thursday) Initial heparin level is slightly below target.    Goal of Therapy:  Heparin level 0.3-0.7 units/ml Monitor platelets by anticoagulation protocol: Yes   Plan:  Increase  Heparin infusion to 850 units/hr Heparin level in 6-8 hours then daily CBC daily while on Heparin  Hart Robinsons A 09/28/2013,9:29 PM

## 2013-09-28 NOTE — Progress Notes (Signed)
ANTICOAGULATION CONSULT NOTE - Initial Consult  Pharmacy Consult for Heparin Indication: atrial fibrillation, bridge for surgery in pt on Coumadin PTA  No Known Allergies  Patient Measurements: Height: 5\' 2"  (157.5 cm) Weight: 123 lb 6.4 oz (55.974 kg) IBW/kg (Calculated) : 54.6  Vital Signs: Temp: 97.8 F (36.6 C) (07/14 0619) Temp src: Oral (07/14 0619) BP: 139/74 mmHg (07/14 0619) Pulse Rate: 64 (07/14 0619)  Labs:  Recent Labs  09/25/13 1548 09/26/13 0603  09/26/13 1932 09/27/13 0527 09/28/13 0629  HGB 13.4 13.4  --   --   --  12.4*  HCT 40.3 40.0  --   --   --  36.6*  PLT 161 159  --   --   --  167  APTT 43*  --   --   --   --   --   LABPROT 26.2*  --   < > 30.4* 25.1* 22.5*  INR 2.40*  --   < > 2.91* 2.28* 1.98*  CREATININE 1.05 0.85  --   --   --   --   < > = values in this interval not displayed.  Estimated Creatinine Clearance: 48.2 ml/min (by C-G formula based on Cr of 0.85).  Medical History: Past Medical History  Diagnosis Date  . Rheumatic heart disease     moderate mitral stenosis and history of CHF  . Hypertension   . Sick sinus syndrome      Paroxysmal and then constant AF; slow rate on modest AV nodal blocking agents  . Chronic low back pain   . Atrial fibrillation     Per record has Afib/Aflutter with SSS  . PVD (peripheral vascular disease)     Right superficial femoral to peroneal artery bypass graft with nonreversed saphenous vein composite  2002  . Mesenteric ischemia     SMA artery embolectomy in 2002  . Dementia     mild/H&P (12/13/2011)  . Prostate cancer   . Shortness of breath     "@ night" (12/13/2011)  . Pacemaker    Medications:  Prescriptions prior to admission  Medication Sig Dispense Refill  . amLODipine (NORVASC) 5 MG tablet Take 1 tablet (5 mg total) by mouth daily.  90 tablet  3  . iron polysaccharides (NIFEREX) 150 MG capsule Take 150 mg by mouth daily.      Marland Kitchen olmesartan (BENICAR) 40 MG tablet Take 40 mg by mouth  daily.      . TRAVATAN Z 0.004 % SOLN ophthalmic solution Place 1 drop into both eyes at bedtime.       Marland Kitchen warfarin (COUMADIN) 5 MG tablet Take 1 tablet (5 mg total) by mouth one time only at 6 PM.  30 tablet  3  . donepezil (ARICEPT) 5 MG tablet Take 5 mg by mouth daily.      Marland Kitchen olmesartan (BENICAR) 40 MG tablet Take 1 tablet (40 mg total) by mouth daily.  30 tablet  6  . zolpidem (AMBIEN) 5 MG tablet Take 2.5 mg by mouth at bedtime as needed.       Assessment: 80yo male admitted with hip fx.  INR now < 2.  Asked to initiate IV Heparin as bridge until pt can have surgery (tentatively planned for Thursday)  Goal of Therapy:  Heparin level 0.3-0.7 units/ml Monitor platelets by anticoagulation protocol: Yes   Plan:  Heparin infusion at 800 units/hr No Bolus (due to elevated INR) Heparin level in 6-8 hours then daily CBC daily while on  Heparin  Hart Robinsons A 09/28/2013,2:26 PM

## 2013-09-28 NOTE — Progress Notes (Signed)
Subjective: My hip hurts   Objective: Vital signs in last 24 hours: Temp:  [97.8 F (36.6 C)-98.1 F (36.7 C)] 97.8 F (36.6 C) (07/14 0619) Pulse Rate:  [60-64] 64 (07/14 0619) Resp:  [20] 20 (07/14 0619) BP: (139-160)/(74-84) 139/74 mmHg (07/14 0619) SpO2:  [98 %-100 %] 100 % (07/14 0619)  Intake/Output from previous day: 07/13 0701 - 07/14 0700 In: 720 [P.O.:720] Out: 700 [Urine:700] Intake/Output this shift:     Recent Labs  09/25/13 1548 09/26/13 0603 09/28/13 0629  HGB 13.4 13.4 12.4*    Recent Labs  09/26/13 0603 09/28/13 0629  WBC 6.0 7.1  RBC 4.70 4.33  HCT 40.0 36.6*  PLT 159 167    Recent Labs  09/25/13 1548 09/26/13 0603  NA 141 139  K 3.8 4.4  CL 102 102  CO2 26 26  BUN 20 15  CREATININE 1.05 0.85  GLUCOSE 114* 102*  CALCIUM 9.3 9.0    Recent Labs  09/27/13 0527 09/28/13 0629  INR 2.28* 1.98*    Neurovascular intact Sensation intact distally Intact pulses distally Dorsiflexion/Plantar flexion intact  INR down to 1.98 and Protime 22.  I will post for Thursday morning.  Assessment/Plan: Intertrochanteric fracture of the right hip.  INR and Protimes slowly coming down.  I will post for Thursday hoping they will be more near normal.  If not, postpone.     Danford Tat 09/28/2013, 7:26 AM

## 2013-09-29 LAB — PROTIME-INR
INR: 1.68 — AB (ref 0.00–1.49)
PROTHROMBIN TIME: 19.8 s — AB (ref 11.6–15.2)

## 2013-09-29 LAB — CBC
HCT: 34.6 % — ABNORMAL LOW (ref 39.0–52.0)
Hemoglobin: 11.7 g/dL — ABNORMAL LOW (ref 13.0–17.0)
MCH: 28.3 pg (ref 26.0–34.0)
MCHC: 33.8 g/dL (ref 30.0–36.0)
MCV: 83.6 fL (ref 78.0–100.0)
PLATELETS: 190 10*3/uL (ref 150–400)
RBC: 4.14 MIL/uL — ABNORMAL LOW (ref 4.22–5.81)
RDW: 14.4 % (ref 11.5–15.5)
WBC: 5.4 10*3/uL (ref 4.0–10.5)

## 2013-09-29 LAB — HEPARIN LEVEL (UNFRACTIONATED): Heparin Unfractionated: 0.54 IU/mL (ref 0.30–0.70)

## 2013-09-29 LAB — PREPARE RBC (CROSSMATCH)

## 2013-09-29 MED ORDER — CEFAZOLIN SODIUM-DEXTROSE 2-3 GM-% IV SOLR
2.0000 g | Freq: Once | INTRAVENOUS | Status: AC
Start: 2013-09-30 — End: 2013-09-30
  Administered 2013-09-30: 2 g via INTRAVENOUS
  Filled 2013-09-29: qty 50

## 2013-09-29 MED ORDER — POVIDONE-IODINE 10 % EX SOLN
Freq: Once | CUTANEOUS | Status: AC
  Administered 2013-09-29: 21:00:00 via TOPICAL
  Filled 2013-09-29: qty 118

## 2013-09-29 MED ORDER — GLUCERNA SHAKE PO LIQD
237.0000 mL | Freq: Every day | ORAL | Status: DC
  Administered 2013-09-30 – 2013-10-03 (×4): 237 mL via ORAL

## 2013-09-29 NOTE — Progress Notes (Signed)
Subjective: I am OK   Objective: Vital signs in last 24 hours: Temp:  [98 F (36.7 C)-98.4 F (36.9 C)] 98 F (36.7 C) (07/15 0703) Pulse Rate:  [59-63] 63 (07/15 0703) Resp:  [20] 20 (07/15 0703) BP: (131-153)/(66-90) 131/76 mmHg (07/15 0703) SpO2:  [99 %-100 %] 99 % (07/15 0703) Weight:  [55.339 kg (122 lb)] 55.339 kg (122 lb) (07/15 0703)  Intake/Output from previous day: 07/14 0701 - 07/15 0700 In: 7726.7 [P.O.:600; I.V.:7126.7] Out: 750 [Urine:750] Intake/Output this shift: Total I/O In: -  Out: 550 [Urine:550]   Recent Labs  09/28/13 0629 09/29/13 0647  HGB 12.4* 11.7*    Recent Labs  09/28/13 0629 09/29/13 0647  WBC 7.1 5.4  RBC 4.33 4.14*  HCT 36.6* 34.6*  PLT 167 190   No results found for this basename: NA, K, CL, CO2, BUN, CREATININE, GLUCOSE, CALCIUM,  in the last 72 hours  Recent Labs  09/27/13 0527 09/28/13 0629  INR 2.28* 1.98*    Neurovascular intact Sensation intact distally Intact pulses distally Dorsiflexion/Plantar flexion intact Labs not back yet this morning.  His heparin will need to be stopped prior to surgery tomorrow. Assessment/Plan: Right hip intertrochanteric fracture.  For surgery tomorrow pending normal INR.   Franchon Ketterman 09/29/2013, 7:23 AM

## 2013-09-29 NOTE — Progress Notes (Signed)
NUTRITION FOLLOW UP  Intervention:   Glucerna Shake po q HS, each supplement provides 220 kcal and 10 grams of protein  Nutrition Dx:   Increased nutrient needs related to acute injury as evidenced by right hip fx pending surgical repair and est protein-energy needs; ongoing  Goal:   Pt to meet >/= 90% of their estimated nutrition needs; goal met  Monitor:   Po intake, labs and wt trends   Assessment:   Pt hx includes dementia, rheumatic heart disease, and PVD.  Pt has right hip fx. He has not been cleared for surgery due to increased INR. Per orthopedics, pt is scheduled for surgery tomorrow pending normal INR.  Weight has remained stable (-0.8%) since 09/26/13.  Pt remains on a carb modified diet. Intake is very good;PO: 50-100%. Noted RD ordered Ensure BID and Prostat TID, however, orders are not currently active. Will add Glucerna q HS, due to slight glucose elevation. Lab reviewed. Glucose slightly elevated with improvement (102). CBGS: 123.   Height: Ht Readings from Last 1 Encounters:  09/25/13 _0  (1.575 m)    Weight Status:   Wt Readings from Last 1 Encounters:  09/29/13 122 lb (55.339 kg)    Re-estimated needs:  Kcal: 3496-1164 Protein: 66-77 grams Fluid: 1.4-1.7 L  Skin: WDL  Diet Order: Carb Control   Intake/Output Summary (Last 24 hours) at 09/29/13 0829 Last data filed at 09/29/13 0827  Gross per 24 hour  Intake 7846.67 ml  Output   1300 ml  Net 6546.67 ml    Last BM: PTA   Labs:   Recent Labs Lab 09/25/13 1548 09/26/13 0603  NA 141 139  K 3.8 4.4  CL 102 102  CO2 26 26  BUN 20 15  CREATININE 1.05 0.85  CALCIUM 9.3 9.0  GLUCOSE 114* 102*    CBG (last 3)   Recent Labs  09/27/13 2141 09/28/13 2018  GLUCAP 123* 123*    Scheduled Meds: . amLODipine  5 mg Oral Daily  . [START ON 09/30/2013]  ceFAZolin (ANCEF) IV  2 g Intravenous Once  . docusate sodium  100 mg Oral BID  . donepezil  5 mg Oral Daily  . irbesartan  300 mg Oral  Daily  . iron polysaccharides  150 mg Oral Daily  . latanoprost  1 drop Both Eyes QHS  . povidone-iodine   Topical Once    Continuous Infusions: . heparin 850 Units/hr (09/28/13 2129)    Garrit Marrow A. Jimmye Norman, RD, LDN Pager: [phone]

## 2013-09-29 NOTE — Progress Notes (Signed)
ANTICOAGULATION CONSULT NOTE - follow up  Pharmacy Consult for Heparin Indication: atrial fibrillation, bridge for surgery in pt on Coumadin PTA  No Known Allergies  Patient Measurements: Height: 5\' 2"  (157.5 cm) Weight: 122 lb (55.339 kg) IBW/kg (Calculated) : 54.6  Vital Signs: Temp: 98 F (36.7 C) (07/15 0703) Temp src: Oral (07/15 0703) BP: 131/76 mmHg (07/15 0703) Pulse Rate: 63 (07/15 0703)  Labs:  Recent Labs  09/27/13 0527 09/28/13 0629 09/28/13 2028 09/29/13 0647  HGB  --  12.4*  --  11.7*  HCT  --  36.6*  --  34.6*  PLT  --  167  --  190  LABPROT 25.1* 22.5*  --  19.8*  INR 2.28* 1.98*  --  1.68*  HEPARINUNFRC  --   --  0.29* 0.54   Estimated Creatinine Clearance: 48.2 ml/min (by C-G formula based on Cr of 0.85).  Medical History: Past Medical History  Diagnosis Date  . Rheumatic heart disease     moderate mitral stenosis and history of CHF  . Hypertension   . Sick sinus syndrome      Paroxysmal and then constant AF; slow rate on modest AV nodal blocking agents  . Chronic low back pain   . Atrial fibrillation     Per record has Afib/Aflutter with SSS  . PVD (peripheral vascular disease)     Right superficial femoral to peroneal artery bypass graft with nonreversed saphenous vein composite  2002  . Mesenteric ischemia     SMA artery embolectomy in 2002  . Dementia     mild/H&P (12/13/2011)  . Prostate cancer   . Shortness of breath     "@ night" (12/13/2011)  . Pacemaker    Medications:  Prescriptions prior to admission  Medication Sig Dispense Refill  . amLODipine (NORVASC) 5 MG tablet Take 1 tablet (5 mg total) by mouth daily.  90 tablet  3  . iron polysaccharides (NIFEREX) 150 MG capsule Take 150 mg by mouth daily.      Marland Kitchen olmesartan (BENICAR) 40 MG tablet Take 40 mg by mouth daily.      . TRAVATAN Z 0.004 % SOLN ophthalmic solution Place 1 drop into both eyes at bedtime.       Marland Kitchen warfarin (COUMADIN) 5 MG tablet Take 1 tablet (5 mg total) by  mouth one time only at 6 PM.  30 tablet  3  . donepezil (ARICEPT) 5 MG tablet Take 5 mg by mouth daily.      Marland Kitchen olmesartan (BENICAR) 40 MG tablet Take 1 tablet (40 mg total) by mouth daily.  30 tablet  6  . zolpidem (AMBIEN) 5 MG tablet Take 2.5 mg by mouth at bedtime as needed.       Assessment: 80yo male admitted with hip fx.  INR now < 2.  Asked to initiate IV Heparin as bridge until pt can have surgery (tentatively planned for Thursday) Heparin level is therapeutic today.  Heparin will be stopped at midnight tonight in anticipation of surgery tomorrow am.  Discussed with Dr Luna Glasgow.  Goal of Therapy:  Heparin level 0.3-0.7 units/ml Monitor platelets by anticoagulation protocol: Yes   Plan:  Continue Heparin infusion at 850 units/hr Stop Heparin tonight at midnight (surgery tomorrow) CBC daily while on Heparin  [place] Crane, Rubena Roseman A 09/29/2013,7:58 AM

## 2013-09-29 NOTE — Progress Notes (Signed)
PROGRESS NOTE  [name] LZJ:[id number] DOB: [birth year] DOA: 09/25/2013 PCP: Leonides Grills, MD  HPI: Admitted 7/11 post fall with R hip fracture.   Subjective: - ongoing hip pain, confused  Past 24 h - INR trending down  Assessment/Plan: Right hip fracture. Appears stable. Management per orthopedics - plan for surgery tomorrow, appreciate input Possible UTI. Cultures negative Atrial fibrillation. Stable. Warfarin on hold, started heparin 7/14 for bridgning. History of rheumatic heart disease with aortic insufficiency and mitral stenosis  - restart Coumadin post surgery per orthopedic surgery, can do Lovenox bridge after History of suspected LA thrombus 11/2011  History of peripheral vascular disease, mesenteric ischemia secondary to embolus to SMA, rheumatic heart disease,  Sick sinus syndrome status post pacemaker placement  Dementia    Diet: carb modified Fluids: none DVT Prophylaxis: heparin  Code Status: Full Family Communication: none this morning  Disposition Plan: inpatient  Consultants:  Orthopedic surgery   Procedures:  None    Antibiotics Ceftriaxone 7/11 x 1 dose  Objective: Filed Vitals:   09/28/13 0619 09/28/13 1449 09/28/13 2225 09/29/13 0703  BP: 139/74 153/90 146/66 131/76  Pulse: 64 59 61 63  Temp: 97.8 F (36.6 C) 98 F (36.7 C) 98.4 F (36.9 C) 98 F (36.7 C)  TempSrc: Oral Oral Oral Oral  Resp: 20 20 20 20   Height:      Weight:    55.339 kg (122 lb)  SpO2: 100% 99% 100% 99%    Intake/Output Summary (Last 24 hours) at 09/29/13 1122 Last data filed at 09/29/13 0827  Gross per 24 hour  Intake 7606.67 ml  Output   1300 ml  Net 6306.67 ml   Filed Weights   09/25/13 1443 09/25/13 2155 09/29/13 0703  Weight: 54.432 kg (120 lb) 55.974 kg (123 lb 6.4 oz) 55.339 kg (122 lb)   Exam:  General:  NAD  Cardiovascular: regular rate and rhythm, without MRG  Respiratory: good air movement, clear to auscultation throughout, no  wheezing, ronchi or rales  Abdomen: soft, not tender to palpation, positive bowel sounds  MSK: no peripheral edema  Data Reviewed: Basic Metabolic Panel:  Recent Labs Lab 09/25/13 1548 09/26/13 0603  NA 141 139  K 3.8 4.4  CL 102 102  CO2 26 26  GLUCOSE 114* 102*  BUN 20 15  CREATININE 1.05 0.85  CALCIUM 9.3 9.0   Liver Function Tests:  Recent Labs Lab 09/25/13 1548  AST 22  ALT 13  ALKPHOS 93  BILITOT 0.3  PROT 7.6  ALBUMIN 3.4*   CBC:  Recent Labs Lab 09/25/13 1548 09/26/13 0603 09/28/13 0629 09/29/13 0647  WBC 4.0 6.0 7.1 5.4  NEUTROABS 2.3  --   --   --   HGB 13.4 13.4 12.4* 11.7*  HCT 40.3 40.0 36.6* 34.6*  MCV 85.0 85.1 84.5 83.6  PLT 161 159 167 190   CBG:  Recent Labs Lab 09/27/13 2141 09/28/13 2018  GLUCAP 123* 123*    Recent Results (from the past 240 hour(s))  URINE CULTURE     Status: None   Collection Time    09/25/13  4:05 PM      Result Value Ref Range Status   Specimen Description URINE, CATHETERIZED   Final   Special Requests NONE   Final   Culture  Setup Time     Final   Value: 09/26/2013 19:49     Performed at [facility]     Final  Value: NO GROWTH     Performed at [facility]   Culture     Final   Value: NO GROWTH     Performed at [facility]   Report Status 09/27/2013 FINAL   Final     Studies: No results found.  Scheduled Meds: . amLODipine  5 mg Oral Daily  . [START ON 09/30/2013]  ceFAZolin (ANCEF) IV  2 g Intravenous Once  . docusate sodium  100 mg Oral BID  . donepezil  5 mg Oral Daily  . feeding supplement (GLUCERNA SHAKE)  237 mL Oral QHS  . irbesartan  300 mg Oral Daily  . iron polysaccharides  150 mg Oral Daily  . latanoprost  1 drop Both Eyes QHS  . povidone-iodine   Topical Once   Continuous Infusions: . heparin 850 Units/hr (09/28/13 2129)    Active Problems:   HYPERLIPIDEMIA   Atrial fibrillation   Encounter for long-term (current) use of  anticoagulants   Dementia   Fracture, intertrochanteric, right femur   Hip fracture  Time spent: 25  This note has been created with Surveyor, quantity. Any transcriptional errors are unintentional.   Marzetta Board, MD [facility] Pager [phone]. If 7 PM - 7 AM, please contact night-coverage at [email or web address], password [facility] 09/29/2013, 11:22 AM  LOS: 4 days

## 2013-09-30 ENCOUNTER — Inpatient Hospital Stay (HOSPITAL_COMMUNITY): Payer: Medicare HMO

## 2013-09-30 ENCOUNTER — Encounter (HOSPITAL_COMMUNITY): Payer: Medicare HMO | Admitting: Anesthesiology

## 2013-09-30 ENCOUNTER — Inpatient Hospital Stay (HOSPITAL_COMMUNITY): Payer: Medicare HMO | Admitting: Anesthesiology

## 2013-09-30 ENCOUNTER — Encounter (HOSPITAL_COMMUNITY): Payer: Self-pay | Admitting: *Deleted

## 2013-09-30 ENCOUNTER — Encounter (HOSPITAL_COMMUNITY)
Admission: EM | Disposition: A | Discharge status: Skilled Nursing Facility | Payer: Self-pay | Source: Home / Self Care | Attending: Internal Medicine

## 2013-09-30 HISTORY — PX: ORIF HIP FRACTURE: SHX2125

## 2013-09-30 LAB — COMPREHENSIVE METABOLIC PANEL
ALT: 12 U/L (ref 0–53)
AST: 23 U/L (ref 0–37)
Albumin: 2.7 g/dL — ABNORMAL LOW (ref 3.5–5.2)
Alkaline Phosphatase: 72 U/L (ref 39–117)
Anion gap: 9 (ref 5–15)
BUN: 15 mg/dL (ref 6–23)
CHLORIDE: 102 meq/L (ref 96–112)
CO2: 27 meq/L (ref 19–32)
Calcium: 8.6 mg/dL (ref 8.4–10.5)
Creatinine, Ser: 0.86 mg/dL (ref 0.50–1.35)
GFR calc Af Amer: 88 mL/min — ABNORMAL LOW (ref >90)
GFR, EST NON AFRICAN AMERICAN: 76 mL/min — AB (ref >90)
GLUCOSE: 103 mg/dL — AB (ref 70–99)
Potassium: 4 mEq/L (ref 3.7–5.3)
SODIUM: 138 meq/L (ref 137–147)
Total Bilirubin: 1 mg/dL (ref 0.3–1.2)
Total Protein: 6.3 g/dL (ref 6.0–8.3)

## 2013-09-30 LAB — CBC
HCT: 34 % — ABNORMAL LOW (ref 39.0–52.0)
HEMOGLOBIN: 11.7 g/dL — AB (ref 13.0–17.0)
MCH: 28.8 pg (ref 26.0–34.0)
MCHC: 34.4 g/dL (ref 30.0–36.0)
MCV: 83.7 fL (ref 78.0–100.0)
PLATELETS: 179 10*3/uL (ref 150–400)
RBC: 4.06 MIL/uL — AB (ref 4.22–5.81)
RDW: 14.5 % (ref 11.5–15.5)
WBC: 4.6 10*3/uL (ref 4.0–10.5)

## 2013-09-30 LAB — PROTIME-INR
INR: 1.18 (ref 0.00–1.49)
PROTHROMBIN TIME: 15 s (ref 11.6–15.2)

## 2013-09-30 LAB — HEPARIN LEVEL (UNFRACTIONATED): Heparin Unfractionated: <0.1 IU/mL — ABNORMAL LOW (ref 0.30–0.70)

## 2013-09-30 SURGERY — OPEN REDUCTION INTERNAL FIXATION HIP
Anesthesia: Spinal | Site: Hip | Laterality: Right

## 2013-09-30 MED ORDER — ALBUTEROL SULFATE (2.5 MG/3ML) 0.083% IN NEBU
2.5000 mg | INHALATION_SOLUTION | Freq: Four times a day (QID) | RESPIRATORY_TRACT | Status: DC
  Administered 2013-09-30 (×2): 2.5 mg via RESPIRATORY_TRACT
  Filled 2013-09-30 (×2): qty 3

## 2013-09-30 MED ORDER — LACTATED RINGERS IV SOLN
INTRAVENOUS | Status: DC
  Administered 2013-09-30: 07:00:00 via INTRAVENOUS

## 2013-09-30 MED ORDER — ALBUTEROL SULFATE (2.5 MG/3ML) 0.083% IN NEBU: 2.5000 mg | INHALATION_SOLUTION | Freq: Four times a day (QID) | RESPIRATORY_TRACT | Status: DC | PRN

## 2013-09-30 MED ORDER — PROPOFOL 10 MG/ML IV EMUL
INTRAVENOUS | Status: AC
  Filled 2013-09-30: qty 20

## 2013-09-30 MED ORDER — HYDROGEN PEROXIDE 3 % EX SOLN
CUTANEOUS | Status: DC | PRN
  Administered 2013-09-30: 1 via TOPICAL

## 2013-09-30 MED ORDER — FENTANYL CITRATE 0.05 MG/ML IJ SOLN
INTRAMUSCULAR | Status: AC
  Filled 2013-09-30: qty 2

## 2013-09-30 MED ORDER — ONDANSETRON HCL 4 MG/2ML IJ SOLN
4.0000 mg | Freq: Once | INTRAMUSCULAR | Status: AC
Start: 2013-09-30 — End: 2013-09-30
  Administered 2013-09-30: 4 mg via INTRAVENOUS
  Filled 2013-09-30: qty 2

## 2013-09-30 MED ORDER — PROPOFOL INFUSION 10 MG/ML OPTIME
INTRAVENOUS | Status: DC | PRN
  Administered 2013-09-30: 50 ug/kg/min via INTRAVENOUS

## 2013-09-30 MED ORDER — SODIUM CHLORIDE 0.9 % IV SOLN
INTRAVENOUS | Status: DC | PRN
  Administered 2013-09-30: 08:00:00 via INTRAVENOUS

## 2013-09-30 MED ORDER — PHENYLEPHRINE HCL 10 MG/ML IJ SOLN
INTRAMUSCULAR | Status: AC
  Filled 2013-09-30: qty 1

## 2013-09-30 MED ORDER — MIDAZOLAM HCL 2 MG/2ML IJ SOLN
1.0000 mg | INTRAMUSCULAR | Status: DC | PRN
  Administered 2013-09-30: 2 mg via INTRAVENOUS

## 2013-09-30 MED ORDER — FENTANYL CITRATE 0.05 MG/ML IJ SOLN
25.0000 ug | INTRAMUSCULAR | Status: AC
  Administered 2013-09-30 (×2): 25 ug via INTRAVENOUS

## 2013-09-30 MED ORDER — BUPIVACAINE IN DEXTROSE 0.75-8.25 % IT SOLN
INTRATHECAL | Status: DC | PRN
  Administered 2013-09-30: 2 mL via INTRATHECAL

## 2013-09-30 MED ORDER — ONDANSETRON HCL 4 MG/2ML IJ SOLN: 4.0000 mg | Freq: Once | INTRAMUSCULAR | Status: DC | PRN

## 2013-09-30 MED ORDER — SUCCINYLCHOLINE CHLORIDE 20 MG/ML IJ SOLN
INTRAMUSCULAR | Status: AC
  Filled 2013-09-30: qty 1

## 2013-09-30 MED ORDER — EPHEDRINE SULFATE 50 MG/ML IJ SOLN
INTRAMUSCULAR | Status: AC
  Filled 2013-09-30: qty 1

## 2013-09-30 MED ORDER — SODIUM CHLORIDE 0.9 % IR SOLN
Status: DC | PRN
  Administered 2013-09-30: 1000 mL

## 2013-09-30 MED ORDER — MIDAZOLAM HCL 2 MG/2ML IJ SOLN
INTRAMUSCULAR | Status: AC
  Filled 2013-09-30: qty 2

## 2013-09-30 MED ORDER — LACTATED RINGERS IV SOLN
INTRAVENOUS | Status: DC | PRN
  Administered 2013-09-30: 07:00:00 via INTRAVENOUS

## 2013-09-30 MED ORDER — BUPIVACAINE IN DEXTROSE 0.75-8.25 % IT SOLN
INTRATHECAL | Status: AC
  Filled 2013-09-30: qty 2

## 2013-09-30 MED ORDER — EPHEDRINE SULFATE 50 MG/ML IJ SOLN
INTRAMUSCULAR | Status: DC | PRN
  Administered 2013-09-30: 10 mg via INTRAVENOUS
  Administered 2013-09-30 (×3): 5 mg via INTRAVENOUS

## 2013-09-30 MED ORDER — PHENYLEPHRINE HCL 10 MG/ML IJ SOLN
INTRAMUSCULAR | Status: DC | PRN
  Administered 2013-09-30 (×2): 50 ug via INTRAVENOUS
  Administered 2013-09-30 (×2): 100 ug via INTRAVENOUS

## 2013-09-30 MED ORDER — FENTANYL CITRATE 0.05 MG/ML IJ SOLN
INTRAMUSCULAR | Status: DC | PRN
  Administered 2013-09-30: 25 ug via INTRATHECAL
  Administered 2013-09-30 (×3): 25 ug via INTRAVENOUS

## 2013-09-30 MED ORDER — LIDOCAINE HCL (PF) 1 % IJ SOLN
INTRAMUSCULAR | Status: AC
  Filled 2013-09-30: qty 5

## 2013-09-30 MED ORDER — ENOXAPARIN SODIUM 40 MG/0.4ML ~~LOC~~ SOLN
40.0000 mg | SUBCUTANEOUS | Status: DC
  Administered 2013-10-01 – 2013-10-04 (×4): 40 mg via SUBCUTANEOUS
  Filled 2013-09-30 (×4): qty 0.4

## 2013-09-30 MED ORDER — ALBUTEROL SULFATE (2.5 MG/3ML) 0.083% IN NEBU
2.5000 mg | INHALATION_SOLUTION | Freq: Two times a day (BID) | RESPIRATORY_TRACT | Status: DC
  Administered 2013-10-01 – 2013-10-03 (×6): 2.5 mg via RESPIRATORY_TRACT
  Filled 2013-09-30 (×6): qty 3

## 2013-09-30 MED ORDER — SODIUM CHLORIDE 0.9 % IJ SOLN
INTRAMUSCULAR | Status: AC
  Filled 2013-09-30: qty 30

## 2013-09-30 MED ORDER — FENTANYL CITRATE 0.05 MG/ML IJ SOLN: 25.0000 ug | INTRAMUSCULAR | Status: DC | PRN

## 2013-09-30 SURGICAL SUPPLY — 49 items
BAG HAMPER (MISCELLANEOUS) ×3 IMPLANT
BIT DRILL TWIST 3.5MM (BIT) ×1 IMPLANT
BLADE 10 SAFETY STRL DISP (BLADE) ×6 IMPLANT
BLADE SURG SZ20 CARB STEEL (BLADE) ×3 IMPLANT
CLOTH BEACON ORANGE TIMEOUT ST (SAFETY) ×3 IMPLANT
COVER LIGHT HANDLE STERIS (MISCELLANEOUS) ×6 IMPLANT
COVER MAYO STAND XLG (DRAPE) ×3 IMPLANT
DRAPE STERI IOBAN 125X83 (DRAPES) ×3 IMPLANT
DRILL TWIST 3.5MM (BIT) ×3
ELECT REM PT RETURN 9FT ADLT (ELECTROSURGICAL) ×3
ELECTRODE REM PT RTRN 9FT ADLT (ELECTROSURGICAL) ×1 IMPLANT
EVACUATOR 3/16  PVC DRAIN (DRAIN) ×2
EVACUATOR 3/16 PVC DRAIN (DRAIN) ×1 IMPLANT
GAUZE SPONGE 4X4 12PLY STRL (GAUZE/BANDAGES/DRESSINGS) ×2 IMPLANT
GAUZE XEROFORM 5X9 LF (GAUZE/BANDAGES/DRESSINGS) ×3 IMPLANT
GLOVE BIO SURGEON STRL SZ8 (GLOVE) ×3 IMPLANT
GLOVE BIO SURGEON STRL SZ8.5 (GLOVE) ×3 IMPLANT
GOWN STRL REUS W/TWL LRG LVL3 (GOWN DISPOSABLE) ×6 IMPLANT
GOWN STRL REUS W/TWL XL LVL3 (GOWN DISPOSABLE) ×3 IMPLANT
GUIDE PIN CALIBRATED (PIN) ×3 IMPLANT
GUIDE PIN CALIBRATED 2.4X23 (PIN) ×1 IMPLANT
INST SET MAJOR BONE (KITS) ×3 IMPLANT
KIT BLADEGUARD II DBL (SET/KITS/TRAYS/PACK) ×3 IMPLANT
KIT ROOM TURNOVER AP CYSTO (KITS) ×3 IMPLANT
MANIFOLD NEPTUNE II (INSTRUMENTS) ×3 IMPLANT
MARKER SKIN DUAL TIP RULER LAB (MISCELLANEOUS) ×3 IMPLANT
NS IRRIG 1000ML POUR BTL (IV SOLUTION) ×3 IMPLANT
PACK BASIC III (CUSTOM PROCEDURE TRAY) ×3
PACK SRG BSC III STRL LF ECLPS (CUSTOM PROCEDURE TRAY) ×1 IMPLANT
PAD ABD 5X9 TENDERSORB (GAUZE/BANDAGES/DRESSINGS) ×5 IMPLANT
PAD ARMBOARD 7.5X6 YLW CONV (MISCELLANEOUS) ×3 IMPLANT
PENCIL HANDSWITCHING (ELECTRODE) ×3 IMPLANT
PLATE SHORT BARREL 135X4 (Plate) ×2 IMPLANT
SCREW CORTICAL SFTP 4.5X36MM (Screw) ×2 IMPLANT
SCREW CORTICAL SFTP 4.5X38MM (Screw) ×4 IMPLANT
SCREW CORTICAL SFTP 4.5X42MM (Screw) ×2 IMPLANT
SCREW LAG 95MM (Screw) ×3 IMPLANT
SCREW LAGSTD 95X21X12.7X9 (Screw) IMPLANT
SET BASIN LINEN APH (SET/KITS/TRAYS/PACK) ×3 IMPLANT
SPONGE DRAIN TRACH 4X4 STRL 2S (GAUZE/BANDAGES/DRESSINGS) ×2 IMPLANT
SPONGE GAUZE 4X4 12PLY (GAUZE/BANDAGES/DRESSINGS) ×2 IMPLANT
SPONGE LAP 18X18 X RAY DECT (DISPOSABLE) ×6 IMPLANT
STAPLER VISISTAT 35W (STAPLE) ×3 IMPLANT
SUT BRALON NAB BRD #1 30IN (SUTURE) ×8 IMPLANT
SUT PLAIN 2 0 XLH (SUTURE) ×3 IMPLANT
SUT SILK 0 FSL (SUTURE) ×3 IMPLANT
SYR BULB IRRIGATION 50ML (SYRINGE) ×3 IMPLANT
TAPE MEDIFIX FOAM 3 (GAUZE/BANDAGES/DRESSINGS) ×3 IMPLANT
YANKAUER SUCT 12FT TUBE ARGYLE (SUCTIONS) ×3 IMPLANT

## 2013-09-30 NOTE — Clinical Social Work Psychosocial (Signed)
Clinical Social Work Department BRIEF PSYCHOSOCIAL ASSESSMENT 09/30/2013  Patient:  [name], [name]     Account Number:  [id number]     Admit date:  09/25/2013  Clinical Social Worker:  [name]  Date/Time:  09/30/2013 08:53 AM  Referred by:  CSW  Date Referred:  09/30/2013 Referred for  SNF Placement   Other Referral:   Interview type:  Family Other interview type:   [place]- son    PSYCHOSOCIAL DATA Living Status:  FAMILY Admitted from facility:   Level of care:   Primary support name:  [name] Primary support relationship to patient:  CHILD, ADULT Degree of support available:   supportive    CURRENT CONCERNS Current Concerns  Post-Acute Placement   Other Concerns:    SOCIAL WORK ASSESSMENT / PLAN CSW met with pt's son, [name]. Pt is currently in surgery. [name] reports pt tripped up in his bedroom shoes and fell, fracturing his hip. [name] has lived with pt the last few months. However, he works 7p-7a. Pt has managed fine at home and is fairly independent. He requires assist with bathing. At baseline, pt generally furniture walks and uses a cane outside of the home. [name] indicates pt was diagnosed with dementia about 1 year ago. CSW discussed d/c planning with [name] and he states he is aware that pt will require SNF. He has already been considering options. Requests [facility]. CSW notified him of [facility] authorization process. CSW sent out bed request and [facility] can accept pt and will initiate pre-cert with insurance. Son aware and accepts offer.   Assessment/plan status:  Psychosocial Support/Ongoing Assessment of Needs Other assessment/ plan:   Information/referral to community resources:   SNF list    PATIENT'S/FAMILY'S RESPONSE TO PLAN OF CARE: Pt unable to discuss plan of care as he is currently in surgery. Pt's son reports he feels pt will accept need for SNF. CSW will continue to follow and send PT notes to [facility] for pre-cert when evaluation  complete.       [name], [place]

## 2013-09-30 NOTE — Progress Notes (Signed)
PROGRESS NOTE  [name] BSJ:[id number] DOB: [birth year] DOA: 09/25/2013 PCP: Leonides Grills, MD  HPI: Admitted 7/11 post fall with R hip fracture.   Subjective: - ongoing hip pain, confused  Past 24 h - INR normal this am, surgery today  Assessment/Plan: Right hip fracture. Appears stable. Management per orthopedics - to OR this morning Possible UTI. Cultures negative Atrial fibrillation. Stable. Warfarin on hold, started heparin 7/14 for bridgning. History of rheumatic heart disease with aortic insufficiency and mitral stenosis  - restart anticoagulation per orthopedic surgery post op History of suspected LA thrombus 11/2011  History of peripheral vascular disease, mesenteric ischemia secondary to embolus to SMA, rheumatic heart disease,  Sick sinus syndrome status post pacemaker placement  Dementia   Diet: carb modified Fluids: none DVT Prophylaxis: heparin  Code Status: Full Family Communication: none this morning  Disposition Plan: inpatient  Consultants:  Orthopedic surgery   Procedures:  None    Antibiotics Ceftriaxone 7/11 x 1 dose  Objective: Filed Vitals:   09/30/13 0710 09/30/13 0715 09/30/13 0720 09/30/13 0725  BP: 147/84 142/79 139/88 132/80  Pulse:      Temp:      TempSrc:      Resp: 16 18 17 15   Height:      Weight:      SpO2: 100% 99% 100% 100%    Intake/Output Summary (Last 24 hours) at 09/30/13 0833 Last data filed at 09/30/13 0810  Gross per 24 hour  Intake 960.58 ml  Output   1400 ml  Net -439.42 ml   Filed Weights   09/25/13 2155 09/29/13 0703 09/30/13 0539  Weight: 55.974 kg (123 lb 6.4 oz) 55.339 kg (122 lb) 56.473 kg (124 lb 8 oz)   Exam:  General:  NAD  Cardiovascular: regular rate and rhythm, without MRG  Respiratory: good air movement, clear to auscultation anterior   Abdomen: soft, not tender to palpation, positive bowel sounds  MSK: no peripheral edema, R hip drain in place, sanguinous d/c  Data  Reviewed: Basic Metabolic Panel:  Recent Labs Lab 09/25/13 1548 09/26/13 0603 09/30/13 0603  NA 141 139 138  K 3.8 4.4 4.0  CL 102 102 102  CO2 26 26 27   GLUCOSE 114* 102* 103*  BUN 20 15 15   CREATININE 1.05 0.85 0.86  CALCIUM 9.3 9.0 8.6   Liver Function Tests:  Recent Labs Lab 09/25/13 1548 09/30/13 0603  AST 22 23  ALT 13 12  ALKPHOS 93 72  BILITOT 0.3 1.0  PROT 7.6 6.3  ALBUMIN 3.4* 2.7*   CBC:  Recent Labs Lab 09/25/13 1548 09/26/13 0603 09/28/13 0629 09/29/13 0647 09/30/13 0603  WBC 4.0 6.0 7.1 5.4 4.6  NEUTROABS 2.3  --   --   --   --   HGB 13.4 13.4 12.4* 11.7* 11.7*  HCT 40.3 40.0 36.6* 34.6* 34.0*  MCV 85.0 85.1 84.5 83.6 83.7  PLT 161 159 167 190 179   CBG:  Recent Labs Lab 09/27/13 2141 09/28/13 2018  GLUCAP 123* 123*    Recent Results (from the past 240 hour(s))  URINE CULTURE     Status: None   Collection Time    09/25/13  4:05 PM      Result Value Ref Range Status   Specimen Description URINE, CATHETERIZED   Final   Special Requests NONE   Final   Culture  Setup Time     Final   Value: 09/26/2013 19:49     Performed at [facility]   [place] [facility]     Final   Value: NO GROWTH     Performed at [facility]   Culture     Final   Value: NO GROWTH     Performed at [facility]   Report Status 09/27/2013 FINAL   Final     Studies: No results found.  Scheduled Meds: . amLODipine  5 mg Oral Daily  . docusate sodium  100 mg Oral BID  . donepezil  5 mg Oral Daily  . feeding supplement (GLUCERNA SHAKE)  237 mL Oral QHS  . irbesartan  300 mg Oral Daily  . iron polysaccharides  150 mg Oral Daily  . latanoprost  1 drop Both Eyes QHS   Continuous Infusions: . lactated ringers 75 mL/hr at 09/30/13 2979    Active Problems:   HYPERLIPIDEMIA   Atrial fibrillation   Encounter for long-term (current) use of anticoagulants   Dementia   Fracture, intertrochanteric, right femur   Hip fracture  Time spent:  15  This note has been created with Surveyor, quantity. Any transcriptional errors are unintentional.   Marzetta Board, MD [facility] Pager [phone]. If 7 PM - 7 AM, please contact night-coverage at [email or web address], password [facility] 09/30/2013, 8:33 AM  LOS: 5 days

## 2013-09-30 NOTE — Clinical Social Work Placement (Addendum)
Clinical Social Work Department CLINICAL SOCIAL WORK PLACEMENT NOTE 09/30/2013  Patient:  CATARINO, VOLD  Account Number:  [id number] Admit date:  09/25/2013  Clinical Social Worker:  Benay Pike, LCSW  Date/time:  09/30/2013 08:51 AM  Clinical Social Work is seeking post-discharge placement for this patient at the following level of care:   SKILLED NURSING   (*CSW will update this form in Epic as items are completed)   09/30/2013  Patient/family provided with [place] Department of Clinical Social Work's list of facilities offering this level of care within the geographic area requested by the patient (or if unable, by the patient's family).  09/30/2013  Patient/family informed of their freedom to choose among providers that offer the needed level of care, that participate in Medicare, Medicaid or managed care program needed by the patient, have an available bed and are willing to accept the patient.  09/30/2013  Patient/family informed of [facility]' ownership interest in [facility], as well as of the fact that they are under no obligation to receive care at this facility.  PASARR submitted to EDS on 09/30/2013 PASARR number received on 09/30/2013  FL2 transmitted to all facilities in geographic area requested by pt/family on  09/30/2013 FL2 transmitted to all facilities within larger geographic area on   Patient informed that his/her managed care company has contracts with or will negotiate with  certain facilities, including the following:     Patient/family informed of bed offers received:  09/30/2013 Patient chooses bed at [facility] Physician recommends and patient chooses bed at  [facility]  Patient to be transferred to  on   Patient to be transferred to facility by  Patient and family notified of transfer on  Name of family member notified:    The following physician request were entered in Epic:   Additional Comments:  Benay Pike, [place]

## 2013-09-30 NOTE — Brief Op Note (Signed)
09/25/2013 - 09/30/2013  9:08 AM  PATIENT:  [name]  80 y.o. male  PRE-OPERATIVE DIAGNOSIS:  Intertrochanteric Right hip Fracture  POST-OPERATIVE DIAGNOSIS:  Intertrochanteric Right hip Fracture  PROCEDURE:  Procedure(s): OPEN TREATMENT INTERNAL FIXATION OF RIGHT HIP FRACTURE (Right)  SURGEON:  Surgeon(s) and Role:    * Sanjuana Kava, MD - Primary  PHYSICIAN ASSISTANT:   ASSISTANTS: B. Caryl Pina, RN   ANESTHESIA:   spinal  EBL:  Total I/O In: 600 [I.V.:600] Out: 250 [Urine:200; Blood:50]  BLOOD ADMINISTERED:none  DRAINS: Hemovac Drain in the right hip area   LOCAL MEDICATIONS USED:  NONE  SPECIMEN:  No Specimen  DISPOSITION OF SPECIMEN:  N/A  COUNTS:  YES  TOURNIQUET:  * No tourniquets in log *  DICTATION: .Other Dictation: Dictation Number [id number]  PLAN OF CARE: Admit to inpatient   PATIENT DISPOSITION:  PACU - hemodynamically stable.   Delay start of Pharmacological VTE agent (>24hrs) due to surgical blood loss or risk of bleeding: no

## 2013-09-30 NOTE — Anesthesia Postprocedure Evaluation (Signed)
  Anesthesia Post-op Note  Patient: [name]  Procedure(s) Performed: Procedure(s): OPEN TREATMENT INTERNAL FIXATION OF RIGHT HIP FRACTURE (Right)  Patient Location: PACU  Anesthesia Type:Spinal  Level of Consciousness: awake, alert , oriented and patient cooperative  Airway and Oxygen Therapy: Patient Spontanous Breathing and Patient connected to nasal cannula oxygen  Post-op Pain: none  Post-op Assessment: Post-op Vital signs reviewed, Patient's Cardiovascular Status Stable, Respiratory Function Stable, Patent Airway, No signs of Nausea or vomiting and Pain level controlled  Post-op Vital Signs: Reviewed and stable  Last Vitals:  Filed Vitals:   09/30/13 0725  BP: 132/80  Pulse:   Temp:   Resp: 15    Complications: No apparent anesthesia complications

## 2013-09-30 NOTE — Progress Notes (Signed)
Patient returned from surgery. Patient was alert and oriented x4. Patient has no complaints at this time. Patients VS were within normal limits and surgical dressing in place. Patient is resting in bed with call light within reach. Family was notified that patient had arrived to the floor at this time. Will continue to monitor patient at this time.

## 2013-09-30 NOTE — Transfer of Care (Signed)
Immediate Anesthesia Transfer of Care Note  Patient: [name]  Procedure(s) Performed: Procedure(s): OPEN TREATMENT INTERNAL FIXATION OF RIGHT HIP FRACTURE (Right)  Patient Location: PACU  Anesthesia Type:Spinal  Level of Consciousness: awake, alert  and oriented  Airway & Oxygen Therapy: Patient Spontanous Breathing and Patient connected to nasal cannula oxygen  Post-op Assessment: Report given to PACU RN and Post -op Vital signs reviewed and stable  Post vital signs: Reviewed and stable  Complications: No apparent anesthesia complications

## 2013-09-30 NOTE — Progress Notes (Signed)
The History and Physical is unchanged. I have examined the patient. The patient is medically able to have surgery on the right hip . Sanjuana Kava

## 2013-09-30 NOTE — Anesthesia Preprocedure Evaluation (Signed)
Anesthesia Evaluation  Patient identified by MRN, date of birth, ID band Patient confused    Reviewed: Allergy & Precautions, H&P , NPO status , Patient's Chart, lab work & pertinent test results, Unable to perform ROS - Chart review only  Airway Mallampati: II TM Distance: >3 FB Neck ROM: Full    Dental  (+) Poor Dentition   Pulmonary shortness of breath,  breath sounds clear to auscultation        Cardiovascular hypertension, Pt. on medications + Peripheral Vascular Disease and +CHF + pacemaker + Valvular Problems/Murmurs Rhythm:Regular Rate:Normal     Neuro/Psych PSYCHIATRIC DISORDERS (dementia)    GI/Hepatic   Endo/Other    Renal/GU      Musculoskeletal   Abdominal   Peds  Hematology   Anesthesia Other Findings Heparin stopped 8 hrs ago.  Reproductive/Obstetrics                           Anesthesia Physical Anesthesia Plan  ASA: IV  Anesthesia Plan: Spinal   Post-op Pain Management:    Induction: Intravenous  Airway Management Planned: Simple Face Mask  Additional Equipment:   Intra-op Plan:   Post-operative Plan:   Informed Consent: I have reviewed the patients History and Physical, chart, labs and discussed the procedure including the risks, benefits and alternatives for the proposed anesthesia with the patient or authorized representative who has indicated his/her understanding and acceptance.     Plan Discussed with:   Anesthesia Plan Comments:         Anesthesia Quick Evaluation

## 2013-09-30 NOTE — Op Note (Signed)
NAMEMarland Chapman  AKIEM, URIETA                ACCOUNT NO.:  [id number]  MEDICAL RECORD NO.:  [id number]  LOCATION:  O756                          FACILITY:  [facility]  PHYSICIAN:  J. Sanjuana Kava, M.D. DATE OF BIRTH:  28-Feb-1925  DATE OF PROCEDURE: DATE OF DISCHARGE:                              OPERATIVE REPORT   PREOPERATIVE DIAGNOSIS:  Intertrochanteric fracture of the right hip.  POSTOPERATIVE DIAGNOSIS:  Intertrochanteric fracture of the right hip.  PROCEDURE:  Open treatment and internal fixation of the right hip intertrochanteric fracture using Richards classic compression screw hip system, 95-mm long compression screw, 135-degree short barrel side plate, 4 hole.  ANESTHESIA:  Spinal.  DRAIN:  One large Hemovac drain.  BLOOD PRODUCTS:  No blood given.  ESTIMATED BLOOD LOSS:  Less than 50 mL.  SURGEON:  J. Sanjuana Kava, M.D.  ASSISTANT:  Simonne Maffucci, RN  INDICATIONS:  The patient fell this past weekend and injured his right hip.  X-rays show intertrochanteric fracture, nondisplaced.  The patient is on Coumadin.  His INR was 3.0 and I had to wait until today, so his INR is down to 1.18.  INR yesterday was 1.86, which was still elevated. He had been admitted by the hospitalist and he has been in the hospital, awaiting for this procedure till we get the ProTime down normal. The patient is also demented.  I have talked to his son and his daughter-in- law, concerning the need for surgery and went over the risks and imponderables with them and they appeared to understand, agreed with the procedure as outlined.  The patient is at high risk.  DESCRIPTION OF PROCEDURE:  The patient was seen in the holding area. The right hip was identified as correct surgical site.  I placed a mark on the right hip.  The patient was brought to the operating room, given spinal anesthesia.  He was then transferred to the fracture table.  C- arm fluoroscopy unit was brought in to ascertain proper  positioning on the table and good visualization by the x-ray equipment.  Prior to using the C-arm, we had a time-out, everyone had on lead aprons, lead shields, lead badges.  The patient was then prepped and draped in usual manner.  We had a time-out.  Everyone in the room knew each other.  All instrumentations were properly positioned and working including the C- arm.  We identified the patient as [name] and we are doing a right hip for intertrochanteric fracture of the hip.  Incision was made through skin, subcutaneous tissue, tensor fascia lata, vastus lateralis.  A guide pin was placed, looked good on AP and lateral views.  A step drill was brought forward.  The guide pin had measured at 100 mm.  I elected to 95-mm compression screw, a step drill was used. Compression screw was inserted with a side plate placed down and 135- degree four-hole short barrel drill holes were made.  System was placed on a compression screw holes.  Screws measuring from 36 mm to 42 mm long.  Permanent x-rays were taken.  Hemovac drain was placed and sewn in with 2-0 silk.  The vastus lateralis was reapproximated using a running  locking #1 Bralon suture.  Fascial layer reapproximated using interrupted figure-of-eight Bralon suture #1.  Subcutaneous tissue was closed using plain and then skin staples were used.  Sterile dressing applied.  The patient tolerated the procedure well and will go to recovery in good condition.  He is admitted to course.          ______________________________ J. Sanjuana Kava, M.D.     JWK/MEDQ  D:  09/30/2013  T:  09/30/2013  Job:  [id number]

## 2013-09-30 NOTE — Anesthesia Procedure Notes (Signed)
Spinal  Patient location during procedure: OR Start time: 09/30/2013 7:58 AM Staffing CRNA/Resident: ADAMS, AMY A Preanesthetic Checklist Completed: patient identified, site marked, surgical consent, pre-op evaluation, timeout performed, IV checked, risks and benefits discussed and monitors and equipment checked Spinal Block Patient position: right lateral decubitus Prep: Betadine Patient monitoring: heart rate, cardiac monitor, continuous pulse ox and blood pressure Approach: right paramedian Location: L3-4 Injection technique: single-shot Needle Needle type: Spinocan  Needle gauge: 22 G Needle length: 9 cm Assessment Sensory level: T8 Additional Notes ATTEMPTS:2 TRAY [id number] TRAY EXPIRATION DATE:11/2014

## 2013-10-01 ENCOUNTER — Encounter (HOSPITAL_COMMUNITY): Payer: Self-pay | Admitting: Orthopaedic Surgery

## 2013-10-01 LAB — CBC WITH DIFFERENTIAL/PLATELET
Basophils Absolute: 0 10*3/uL (ref 0.0–0.1)
Basophils Relative: 0 % (ref 0–1)
Eosinophils Absolute: 0 10*3/uL (ref 0.0–0.7)
Eosinophils Relative: 1 % (ref 0–5)
HCT: 33.2 % — ABNORMAL LOW (ref 39.0–52.0)
Hemoglobin: 11.1 g/dL — ABNORMAL LOW (ref 13.0–17.0)
Lymphocytes Relative: 12 % (ref 12–46)
Lymphs Abs: 0.8 10*3/uL (ref 0.7–4.0)
MCH: 28.5 pg (ref 26.0–34.0)
MCHC: 33.4 g/dL (ref 30.0–36.0)
MCV: 85.1 fL (ref 78.0–100.0)
Monocytes Absolute: 1 10*3/uL (ref 0.1–1.0)
Monocytes Relative: 15 % — ABNORMAL HIGH (ref 3–12)
Neutro Abs: 4.8 10*3/uL (ref 1.7–7.7)
Neutrophils Relative %: 72 % (ref 43–77)
Platelets: 180 10*3/uL (ref 150–400)
RBC: 3.9 MIL/uL — ABNORMAL LOW (ref 4.22–5.81)
RDW: 14.7 % (ref 11.5–15.5)
WBC: 6.7 10*3/uL (ref 4.0–10.5)

## 2013-10-01 LAB — BASIC METABOLIC PANEL
Anion gap: 9 (ref 5–15)
BUN: 15 mg/dL (ref 6–23)
CO2: 27 mEq/L (ref 19–32)
Calcium: 9.1 mg/dL (ref 8.4–10.5)
Chloride: 101 mEq/L (ref 96–112)
Creatinine, Ser: 0.87 mg/dL (ref 0.50–1.35)
GFR calc Af Amer: 88 mL/min — ABNORMAL LOW (ref >90)
GFR, EST NON AFRICAN AMERICAN: 76 mL/min — AB (ref >90)
Glucose, Bld: 119 mg/dL — ABNORMAL HIGH (ref 70–99)
POTASSIUM: 4.7 meq/L (ref 3.7–5.3)
SODIUM: 137 meq/L (ref 137–147)

## 2013-10-01 LAB — PROTIME-INR
INR: 1.21 (ref 0.00–1.49)
Prothrombin Time: 15.3 seconds — ABNORMAL HIGH (ref 11.6–15.2)

## 2013-10-01 MED ORDER — WARFARIN SODIUM 6 MG PO TABS
6.0000 mg | ORAL_TABLET | Freq: Once | ORAL | Status: AC
  Administered 2013-10-01: 6 mg via ORAL
  Filled 2013-10-01: qty 1

## 2013-10-01 MED ORDER — WARFARIN - PHARMACIST DOSING INPATIENT
Status: DC
  Administered 2013-10-01: 1

## 2013-10-01 NOTE — Progress Notes (Signed)
Subjective: 1 Day Post-Op Procedure(s) (LRB): OPEN TREATMENT INTERNAL FIXATION OF RIGHT HIP FRACTURE (Right) Patient reports pain as mild.    Objective: Vital signs in last 24 hours: Temp:  [96.8 F (36 C)-98.2 F (36.8 C)] 98 F (36.7 C) (07/17 0621) Pulse Rate:  [60-66] 62 (07/17 0621) Resp:  [15-30] 20 (07/17 0621) BP: (109-154)/(45-83) 147/77 mmHg (07/17 0621) SpO2:  [98 %-100 %] 100 % (07/17 0621)  Intake/Output from previous day: 07/16 0701 - 07/17 0700 In: 1440 [P.O.:540; I.V.:900] Out: 1560 [Urine:1450; Drains:60; Blood:50] Intake/Output this shift:     Recent Labs  09/29/13 0647 09/30/13 0603 10/01/13 0543  HGB 11.7* 11.7* 11.1*    Recent Labs  09/30/13 0603 10/01/13 0543  WBC 4.6 6.7  RBC 4.06* 3.90*  HCT 34.0* 33.2*  PLT 179 180    Recent Labs  09/30/13 0603 10/01/13 0543  NA 138 137  K 4.0 4.7  CL 102 101  CO2 27 27  BUN 15 15  CREATININE 0.86 0.87  GLUCOSE 103* 119*  CALCIUM 8.6 9.1    Recent Labs  09/30/13 0603 10/01/13 0543  INR 1.18 1.21    Neurovascular intact Sensation intact distally Intact pulses distally Dorsiflexion/Plantar flexion intact  He is chronically confused.  He does not complain of pain.  To begin PT today.  Assessment/Plan: 1 Day Post-Op Procedure(s) (LRB): OPEN TREATMENT INTERNAL FIXATION OF RIGHT HIP FRACTURE (Right) Up with therapy  [name] 10/01/2013, 7:25 AM

## 2013-10-01 NOTE — Progress Notes (Signed)
PROGRESS NOTE  [name] KGU:[id number] DOB: 10-20-1924 DOA: 09/25/2013 PCP: Leonides Grills, MD  HPI: Admitted 7/11 post fall with R hip fracture.   Subjective: - feels better  Past 24 h - surgery 7/16, HH stable  Assessment/Plan: Right hip fracture. Appears stable. Management per orthopedics Possible UTI. Cultures negative Atrial fibrillation. Stable. Warfarin on hold, started heparin 7/14 for bridgning. 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, rheumatic heart disease,  Sick sinus syndrome status post pacemaker placement  Dementia   Diet: carb modified Fluids: none DVT Prophylaxis: heparin  Code Status: Full Family Communication: none this morning  Disposition Plan: inpatient, SNF Monday  Consultants:  Orthopedic surgery   Procedures:  None    Antibiotics Ceftriaxone 7/11 x 1 dose  Objective: Filed Vitals:   09/30/13 1910 09/30/13 2131 10/01/13 0022 10/01/13 0621  BP:  130/76 151/67 147/77  Pulse:  66 60 62  Temp:  98.2 F (36.8 C) 98.1 F (36.7 C) 98 F (36.7 C)  TempSrc:  Oral Oral Oral  Resp:  20 20 20   Height:      Weight:      SpO2: 98% 100% 100% 100%    Intake/Output Summary (Last 24 hours) at 10/01/13 0729 Last data filed at 10/01/13 0600  Gross per 24 hour  Intake   1440 ml  Output   1560 ml  Net   -120 ml   Filed Weights   09/25/13 2155 09/29/13 0703 09/30/13 0539  Weight: 55.974 kg (123 lb 6.4 oz) 55.339 kg (122 lb) 56.473 kg (124 lb 8 oz)   Exam:  General:  NAD  Cardiovascular: regular rate and rhythm, without MRG  Respiratory: good air movement, clear to auscultation anterior   Abdomen: soft, not tender to palpation, positive bowel sounds  MSK: no peripheral edema, R hip drain in place, sanguinous d/c  Data  Reviewed: Basic Metabolic Panel:  Recent Labs Lab 09/25/13 1548 09/26/13 0603 09/30/13 0603 10/01/13 0543  NA 141 139 138 137  K 3.8 4.4 4.0 4.7  CL 102 102 102 101  CO2 26 26 27 27   GLUCOSE 114* 102* 103* 119*  BUN 20 15 15 15   CREATININE 1.05 0.85 0.86 0.87  CALCIUM 9.3 9.0 8.6 9.1   Liver Function Tests:  Recent Labs Lab 09/25/13 1548 09/30/13 0603  AST 22 23  ALT 13 12  ALKPHOS 93 72  BILITOT 0.3 1.0  PROT 7.6 6.3  ALBUMIN 3.4* 2.7*   CBC:  Recent Labs Lab 09/25/13 1548 09/26/13 0603 09/28/13 0629 09/29/13 0647 09/30/13 0603 10/01/13 0543  WBC 4.0 6.0 7.1 5.4 4.6 6.7  NEUTROABS 2.3  --   --   --   --  4.8  HGB 13.4 13.4 12.4* 11.7* 11.7* 11.1*  HCT 40.3 40.0 36.6* 34.6* 34.0* 33.2*  MCV 85.0 85.1 84.5 83.6 83.7 85.1  PLT 161 159 167 190 179 180   CBG:  Recent Labs Lab 09/27/13 2141 09/28/13 2018  GLUCAP 123* 123*    Recent Results (from the past 240 hour(s))  URINE CULTURE     Status: None   Collection Time    09/25/13  4:05 PM      Result Value Ref Range Status   Specimen Description URINE, CATHETERIZED   Final   Special Requests NONE   Final   Culture  Setup Time     Final   Value: 09/26/2013 19:49     Performed at [place]     Final   Value: NO GROWTH     Performed at [facility]   Culture     Final   Value: NO GROWTH     Performed at [facility]   Report Status 09/27/2013 FINAL   Final     Studies: Dg Hip Operative Right  2013/10/13   CLINICAL DATA:  ORIF right hip fracture  EXAM: DG OPERATIVE RIGHT HIP  FLUOROSCOPY TIME:  33 seconds  TECHNIQUE: A single spot fluoroscopic AP image of the right hip is submitted.  COMPARISON:  None.  FINDINGS: Interval placement of a proximal right lateral sideplate and interlocking femoral neck dynamic compression screw transfixing a intertrochanteric fracture. No hardware failure or complication.  IMPRESSION: ORIF right intertrochanteric fracture.    Electronically Signed   By: Kathreen Devoid   On: 10/13/2013 09:10    Scheduled Meds: . albuterol  2.5 mg Nebulization BID  . amLODipine  5 mg Oral Daily  . docusate sodium  100 mg Oral BID  . donepezil  5 mg Oral Daily  . enoxaparin (LOVENOX) injection  40 mg Subcutaneous Q24H  . feeding supplement (GLUCERNA SHAKE)  237 mL Oral QHS  . irbesartan  300 mg Oral Daily  . iron polysaccharides  150 mg Oral Daily  . latanoprost  1 drop Both Eyes QHS   Continuous Infusions:    Active Problems:   HYPERLIPIDEMIA   Atrial fibrillation   Encounter for long-term (current) use of anticoagulants   Dementia   Fracture, intertrochanteric, right femur   Hip fracture  Time spent: 15  This note has been created with Surveyor, quantity. Any transcriptional errors are unintentional.   Marzetta Board, MD [facility] Pager [phone]. If 7 PM - 7 AM, please contact night-coverage at [email or web address], password [facility] 10/01/2013, 7:29 AM  LOS: 6 days

## 2013-10-01 NOTE — Anesthesia Postprocedure Evaluation (Signed)
  Anesthesia Post-op Note  Patient: [name]  Procedure(s) Performed: Procedure(s): OPEN TREATMENT INTERNAL FIXATION OF RIGHT HIP FRACTURE (Right)  Patient Location: Room 337  Anesthesia Type:Spinal  Level of Consciousness: patient cooperative and confused  Airway and Oxygen Therapy: Patient Spontanous Breathing and Patient connected to nasal cannula oxygen  Post-op Pain: mild  Post-op Assessment: Post-op Vital signs reviewed, Patient's Cardiovascular Status Stable, Respiratory Function Stable, Patent Airway and No signs of Nausea or vomiting  Post-op Vital Signs: Reviewed and stable  Last Vitals:  Filed Vitals:   10/01/13 0916  BP: 138/74  Pulse:   Temp:   Resp:     Complications: No apparent anesthesia complications

## 2013-10-01 NOTE — Addendum Note (Signed)
Addendum created 10/01/13 0943 by Mickel Baas, CRNA   Modules edited: Notes Section   Notes Section:  File: [id number]

## 2013-10-01 NOTE — Evaluation (Signed)
Physical Therapy Evaluation Patient Details Name: [name] MRN: [id number] DOB: 08-03-1924 Today's Date: 10/01/2013   History of Present Illness  [name] is a 80 y.o. male with a baseline history of dementia presented to the ED after taking a fall. The result is a fracture of his right hip. Patient is also on coumadin and according to the INR has been therapeutic. Orthopedics has seen the patient and would like to hold off on surgery until his INR is normal. Patient is not able to provide any history and there is no family present. Patient apparently tripped and did not lose consciousness. He did not have any other complaints.  Clinical Impression  Pt is an 80 year old male who presents to physical therapy after a trip and fall at home.  Pt had a fall at home on 09/25/13, resulting in an intertrochanteric Rt hip fracture.  Pt underwent ORIF surgery to repair on 09/30/13, with WB restrictions TTWB.  Informed pt of WB restrictions with verbal and visual demonstrations x3, though pt unable to return verbalize.  Pt has a hx of dementia, and was aware of self, location, though unable to correctly state date, time, condition (unable to remember he had surgery on Rt hip, only states hip hurts and something is wrong with it).  Pt is a poor historian, and pt unable to verbalize history.  Per chart, pt son has been living with him and he amb in the home as a furniture walker and outside the home with a std cane.  During evaluation, pt able to complete AROM for ankle pumps on Rt, though unable to perform heels slides AROM or AAROM.  Pt required mod/max assist for bed mobility skills.  Attempted sit <-> stand transfer, with pt Rt foot on PT foot to avoid pt breaking WB precautions.  Pt required use of RW, bed elevated, and attempted max assist though pt unable to transfer into standing.  Noted facial grimacing and moaning when sitting at EOB, with reports a "hurts a lot".   Recommend continued PT to address  strengthening, pain management, balance, and activity tolerance for improvement of functional mobility skills with transition to HHPT at discharge for continued rehab.  Pt will require use of RW for gait, will defer to SNF for additional needs.      Follow Up Recommendations SNF    Equipment Recommendations   (Recommend use of RW for gait, will defer to SNF for DME needs)    Recommendations for Other Services OT consult     Precautions / Restrictions Precautions Precautions: Fall Restrictions Weight Bearing Restrictions: Yes Other Position/Activity Restrictions: TTWB Rt LE      Mobility  Bed Mobility Overal bed mobility: Needs Assistance Bed Mobility: Supine to Sit;Sit to Supine     Supine to sit: HOB elevated;Mod assist;Max assist (Mod assist for trunk, max assist for LE) Sit to supine: Max assist (for trunk and LE)   General bed mobility comments: Pt attempts to assist with transfer (grabbing on to handrails, scooting bottom over) though unable to to move LE or trunk without assist.    Transfers Overall transfer level: Needs assistance Equipment used: Rolling walker (2 wheeled) Transfers: Sit to/from Stand Sit to Stand: From elevated surface;Total assist         General transfer comment: Educated pt on WB restrictions, though pt unable to return verbalize despite verbal/visual instructions x3.  During transfer, pt Rt LE placed on PT foot to avoid WB on the Rt  LE as pt unable to verbalize WB restrictions.  Attempted sit <-> stand  with use of RW and max assist, though pt unable to complete.    Ambulation/Gait             General Gait Details: Unable to attempt as pt unable to stand.       Balance Overall balance assessment: History of Falls;Needs assistance (Hx of fall to place pt in hospital this time with intertrochanteric hip fx) Sitting-balance support: Feet supported;Single extremity supported Sitting balance-Leahy Scale: Fair       Standing  balance-Leahy Scale: Zero Standing balance comment: Unable to assess as pt unable to stand.                              Pertinent Vitals/Pain PAINAD 3 during transfer    Home Living Family/patient expects to be discharged to:: [facility]: Children               Additional Comments: Pt is a poor historian.  Pt reports his son lives with him; unable to report any other home set up information.   Per chart, pt son has been living with pt the past few months.   DME: std cane    Prior Function Level of Independence: Independent with assistive device(s);Needs assistance   Gait / Transfers Assistance Needed: Pt is a poor historian.  Per chart, son reports pt is fairly (I) and ambs in home with furniture and outside the home with std cane.    ADL's / Homemaking Assistance Needed: Pt is a poor historian.  Per chart, son reports pt requires assistance with bathing.          Hand Dominance   Dominant Hand: Right    Extremity/Trunk Assessment               Lower Extremity Assessment: LLE deficits/detail;RLE deficits/detail RLE Deficits / Details: Pt able to complete AROM Rt ankle, unable to actively move Rt knee or hip secondary to pain.   LLE Deficits / Details: Unable to follow commands for MMT.  Strength assessed through functional mobility 3/5 in Lt LE.       Communication   Communication: HOH  Cognition Arousal/Alertness: Awake/alert Behavior During Therapy: WFL for tasks assessed/performed Overall Cognitive Status: No family/caregiver present to determine baseline cognitive functioning (Aware of self, place.  Not time, condition.  )                         Exercises Total Joint Exercises Ankle Circles/Pumps: AROM;Both;10 reps;Supine Heel Slides: PROM;AROM;Both;10 reps;Supine (PROM Rt, AROM Lt)      Assessment/Plan    PT Assessment Patient needs continued PT services  PT Diagnosis Difficulty  walking;Generalized weakness;Acute pain   PT Problem List Decreased strength;Decreased cognition;Decreased range of motion;Decreased activity tolerance;Decreased safety awareness;Decreased knowledge of use of DME;Decreased balance;Decreased mobility;Pain  PT Treatment Interventions Balance training;Gait training;Neuromuscular re-education;Functional mobility training;Therapeutic activities;Therapeutic exercise;Patient/family education;DME instruction   PT Goals (Current goals can be found in the Care Plan section) Acute Rehab PT Goals Patient Stated Goal: "do for myself" PT Goal Formulation: With patient Time For Goal Achievement: 10/15/13 Potential to Achieve Goals: Good    Frequency Min 5X/week   Barriers to discharge Other (comment);Decreased caregiver support Decreased functional mobility skills.         End of Session Equipment Utilized During Treatment: Gait belt Activity Tolerance: Patient limited  by pain Patient left: in bed;with call bell/phone within reach;with bed alarm set Nurse Communication: Patient requests pain meds         Time: 1859-0931 PT Time Calculation (min): 26 min   Charges:   PT Evaluation $Initial PT Evaluation Tier I: 1 Procedure          Nitza Schmid 10/01/2013, 9:01 AM

## 2013-10-01 NOTE — Clinical Social Work Note (Signed)
CSW faxed PT notes to [facility] this morning. Mardene Celeste at facility is working on authorization and states she has received a reference number and will now fax in PT eval. She is aware of probable d/c on Monday per MD.  Benay Pike, [place]

## 2013-10-01 NOTE — Progress Notes (Signed)
ANTICOAGULATION CONSULT NOTE - follow up  Pharmacy Consult for Coumadin & Lovenox Indication: atrial fibrillation  No Known Allergies  Patient Measurements: Height: 5\' 2"  (157.5 cm) Weight: 124 lb 8 oz (56.473 kg) IBW/kg (Calculated) : 54.6  Vital Signs: Temp: 98 F (36.7 C) (07/17 0621) Temp src: Oral (07/17 0621) BP: 147/77 mmHg (07/17 0621) Pulse Rate: 62 (07/17 0621)  Labs:  Recent Labs  09/28/13 2028  09/29/13 0647 09/30/13 0603 10/01/13 0543  HGB  --   < > 11.7* 11.7* 11.1*  HCT  --   --  34.6* 34.0* 33.2*  PLT  --   --  190 179 180  LABPROT  --   --  19.8* 15.0 15.3*  INR  --   --  1.68* 1.18 1.21  HEPARINUNFRC 0.29*  --  0.54 <0.10*  --   CREATININE  --   --   --  0.86 0.87  < > = values in this interval not displayed.  Estimated Creatinine Clearance: 47.1 ml/min (by C-G formula based on Cr of 0.87).  Medical History: Past Medical History  Diagnosis Date  . Rheumatic heart disease     moderate mitral stenosis and history of CHF  . Hypertension   . Sick sinus syndrome      Paroxysmal and then constant AF; slow rate on modest AV nodal blocking agents  . Chronic low back pain   . Atrial fibrillation     Per record has Afib/Aflutter with SSS  . PVD (peripheral vascular disease)     Right superficial femoral to peroneal artery bypass graft with nonreversed saphenous vein composite  2002  . Mesenteric ischemia     SMA artery embolectomy in 2002  . Dementia     mild/H&P (12/13/2011)  . Prostate cancer   . Shortness of breath     "@ night" (12/13/2011)  . Pacemaker    Medications:  Prescriptions prior to admission  Medication Sig Dispense Refill  . amLODipine (NORVASC) 5 MG tablet Take 1 tablet (5 mg total) by mouth daily.  90 tablet  3  . iron polysaccharides (NIFEREX) 150 MG capsule Take 150 mg by mouth daily.      Marland Kitchen olmesartan (BENICAR) 40 MG tablet Take 40 mg by mouth daily.      . TRAVATAN Z 0.004 % SOLN ophthalmic solution Place 1 drop into both  eyes at bedtime.       Marland Kitchen warfarin (COUMADIN) 5 MG tablet Take 1 tablet (5 mg total) by mouth one time only at 6 PM.  30 tablet  3  . donepezil (ARICEPT) 5 MG tablet Take 5 mg by mouth daily.      Marland Kitchen olmesartan (BENICAR) 40 MG tablet Take 1 tablet (40 mg total) by mouth daily.  30 tablet  6  . zolpidem (AMBIEN) 5 MG tablet Take 2.5 mg by mouth at bedtime as needed.       Assessment: 80yo male admitted with hip fx.  Pt is s/p internal fixation on 7/16.  Pt on Warfarin PTA for h/o afib and home dose reportedly 5mg  daily.  Asked to resume Warfarin today and continue Lovenox for VTE prophylaxis until INR at goal.    Goal of Therapy:  Monitor platelets by anticoagulation protocol: Yes INR 2-3   Plan:  Lovenox 40mg  SQ q24hrs until INR at goal Coumadin 6mg  today x 1 INR daily Monitor CBC  Vaunda Gutterman A 10/01/2013,7:41 AM

## 2013-10-02 LAB — CBC WITH DIFFERENTIAL/PLATELET
BASOS ABS: 0 10*3/uL (ref 0.0–0.1)
BASOS PCT: 0 % (ref 0–1)
EOS PCT: 1 % (ref 0–5)
Eosinophils Absolute: 0.1 10*3/uL (ref 0.0–0.7)
HEMATOCRIT: 31.5 % — AB (ref 39.0–52.0)
HEMOGLOBIN: 10.7 g/dL — AB (ref 13.0–17.0)
Lymphocytes Relative: 10 % — ABNORMAL LOW (ref 12–46)
Lymphs Abs: 0.9 10*3/uL (ref 0.7–4.0)
MCH: 28.8 pg (ref 26.0–34.0)
MCHC: 34 g/dL (ref 30.0–36.0)
MCV: 84.9 fL (ref 78.0–100.0)
MONOS PCT: 18 % — AB (ref 3–12)
Monocytes Absolute: 1.5 10*3/uL — ABNORMAL HIGH (ref 0.1–1.0)
Neutro Abs: 6 10*3/uL (ref 1.7–7.7)
Neutrophils Relative %: 71 % (ref 43–77)
Platelets: 198 10*3/uL (ref 150–400)
RBC: 3.71 MIL/uL — ABNORMAL LOW (ref 4.22–5.81)
RDW: 14.7 % (ref 11.5–15.5)
WBC: 8.5 10*3/uL (ref 4.0–10.5)

## 2013-10-02 LAB — TYPE AND SCREEN
ABO/RH(D): O POS
Antibody Screen: NEGATIVE
Unit division: 0
Unit division: 0

## 2013-10-02 LAB — BASIC METABOLIC PANEL
Anion gap: 8 (ref 5–15)
BUN: 15 mg/dL (ref 6–23)
CALCIUM: 8.7 mg/dL (ref 8.4–10.5)
CHLORIDE: 98 meq/L (ref 96–112)
CO2: 28 meq/L (ref 19–32)
CREATININE: 0.79 mg/dL (ref 0.50–1.35)
GFR calc Af Amer: >90 mL/min (ref >90)
GFR calc non Af Amer: 79 mL/min — ABNORMAL LOW (ref >90)
GLUCOSE: 106 mg/dL — AB (ref 70–99)
Potassium: 5.2 mEq/L (ref 3.7–5.3)
Sodium: 134 mEq/L — ABNORMAL LOW (ref 137–147)

## 2013-10-02 LAB — PROTIME-INR
INR: 1.24 (ref 0.00–1.49)
Prothrombin Time: 15.6 seconds — ABNORMAL HIGH (ref 11.6–15.2)

## 2013-10-02 MED ORDER — WARFARIN SODIUM 6 MG PO TABS
6.0000 mg | ORAL_TABLET | Freq: Once | ORAL | Status: AC
  Administered 2013-10-02: 6 mg via ORAL
  Filled 2013-10-02: qty 1

## 2013-10-02 NOTE — Progress Notes (Signed)
PROGRESS NOTE  [name]:[id number] DOB: 12-31-1924 DOA: 09/25/2013 PCP: Leonides Grills, MD  HPI: Admitted 7/11 post fall with R hip fracture.   Subjective: - feels better  Past 24 h - surgery 7/16, HH stable still  Assessment/Plan: Right hip fracture.  Management per orthopedics - drain out today Possible UTI. Cultures negative Atrial fibrillation. Stable. Warfarin on hold, started heparin 7/14 for bridgning. 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, rheumatic heart disease,  Sick sinus syndrome status post pacemaker placement  Dementia   Diet: carb modified Fluids: none DVT Prophylaxis: heparin  Code Status: Full Family Communication: none this morning  Disposition Plan: inpatient, SNF Monday  Consultants:  Orthopedic surgery   Procedures:  None    Antibiotics Ceftriaxone 7/11 x 1 dose  Objective: Filed Vitals:   10/01/13 1437 10/01/13 1948 10/02/13 0433 10/02/13 0704  BP: 136/70  141/76   Pulse: 62  68   Temp: 98.3 F (36.8 C)  98.5 F (36.9 C)   TempSrc: Oral  Oral   Resp: 20  18   Height:      Weight:      SpO2: 100% 96% 98% 95%    Intake/Output Summary (Last 24 hours) at 10/02/13 0748 Last data filed at 10/02/13 0303  Gross per 24 hour  Intake    120 ml  Output   1200 ml  Net  -1080 ml   Filed Weights   09/25/13 2155 09/29/13 0703 09/30/13 0539  Weight: 55.974 kg (123 lb 6.4 oz) 55.339 kg (122 lb) 56.473 kg (124 lb 8 oz)   Exam:  General:  NAD  Cardiovascular: regular rate and rhythm, without MRG  Respiratory: good air movement, clear to auscultation anterior   Abdomen: soft, not tender to palpation, positive bowel sounds  MSK: no peripheral edema, dressing intact  Data Reviewed: Basic Metabolic Panel:  Recent  Labs Lab 09/25/13 1548 09/26/13 0603 09/30/13 0603 10/01/13 0543 10/02/13 0622  NA 141 139 138 137 134*  K 3.8 4.4 4.0 4.7 5.2  CL 102 102 102 101 98  CO2 26 26 27 27 28   GLUCOSE 114* 102* 103* 119* 106*  BUN 20 15 15 15 15   CREATININE 1.05 0.85 0.86 0.87 0.79  CALCIUM 9.3 9.0 8.6 9.1 8.7   Liver Function Tests:  Recent Labs Lab 09/25/13 1548 09/30/13 0603  AST 22 23  ALT 13 12  ALKPHOS 93 72  BILITOT 0.3 1.0  PROT 7.6 6.3  ALBUMIN 3.4* 2.7*   CBC:  Recent Labs Lab 09/25/13 1548  09/28/13 0629 09/29/13 0647 09/30/13 0603 10/01/13 0543 10/02/13 0622  WBC 4.0  < > 7.1 5.4 4.6 6.7 8.5  NEUTROABS 2.3  --   --   --   --  4.8 6.0  HGB 13.4  < > 12.4* 11.7* 11.7* 11.1* 10.7*  HCT 40.3  < > 36.6* 34.6* 34.0* 33.2* 31.5*  MCV 85.0  < > 84.5 83.6 83.7 85.1 84.9  PLT 161  < > 167 190 179 180 198  < > = values in this interval not displayed. CBG:  Recent Labs Lab 09/27/13 2141 09/28/13 2018  GLUCAP 123* 123*    Recent Results (from the past 240 hour(s))  URINE CULTURE     Status: None   Collection Time    09/25/13  4:05  PM      Result Value Ref Range Status   Specimen Description URINE, CATHETERIZED   Final   Special Requests NONE   Final   Culture  Setup Time     Final   Value: 09/26/2013 19:49     Performed at [place]     Final   Value: NO GROWTH     Performed at [facility]   Culture     Final   Value: NO GROWTH     Performed at [facility]   Report Status 09/27/2013 FINAL   Final     Studies: Dg Hip Operative Right  10-14-13   CLINICAL DATA:  ORIF right hip fracture  EXAM: DG OPERATIVE RIGHT HIP  FLUOROSCOPY TIME:  33 seconds  TECHNIQUE: A single spot fluoroscopic AP image of the right hip is submitted.  COMPARISON:  None.  FINDINGS: Interval placement of a proximal right lateral sideplate and interlocking femoral neck dynamic compression screw transfixing a intertrochanteric fracture. No hardware  failure or complication.  IMPRESSION: ORIF right intertrochanteric fracture.   Electronically Signed   By: Kathreen Devoid   On: 2013/10/14 09:10    Scheduled Meds: . albuterol  2.5 mg Nebulization BID  . amLODipine  5 mg Oral Daily  . docusate sodium  100 mg Oral BID  . donepezil  5 mg Oral Daily  . enoxaparin (LOVENOX) injection  40 mg Subcutaneous Q24H  . feeding supplement (GLUCERNA SHAKE)  237 mL Oral QHS  . irbesartan  300 mg Oral Daily  . iron polysaccharides  150 mg Oral Daily  . latanoprost  1 drop Both Eyes QHS  . Warfarin - Pharmacist Dosing Inpatient   Does not apply Q24H   Continuous Infusions:    Active Problems:   HYPERLIPIDEMIA   Atrial fibrillation   Encounter for long-term (current) use of anticoagulants   Dementia   Fracture, intertrochanteric, right femur   Hip fracture  Time spent: 15  This note has been created with Surveyor, quantity. Any transcriptional errors are unintentional.   Marzetta Board, MD [facility] Pager [phone]. If 7 PM - 7 AM, please contact night-coverage at [email or web address], password [facility] 10/02/2013, 7:48 AM  LOS: 7 days

## 2013-10-02 NOTE — Progress Notes (Signed)
Patient progressing towards the goals ,decreased cognition and ability to follow instructions occasionally noted while performing functional activities, going to SNF for rehab on dc is appropriate at this time.

## 2013-10-02 NOTE — Progress Notes (Signed)
Subjective: 2 Days Post-Op Procedure(s) (LRB): OPEN TREATMENT INTERNAL FIXATION OF RIGHT HIP FRACTURE (Right) Patient reports pain as mild.    Objective: Vital signs in last 24 hours: Temp:  [98.3 F (36.8 C)-98.5 F (36.9 C)] 98.5 F (36.9 C) (07/18 0433) Pulse Rate:  [62-68] 68 (07/18 0433) Resp:  [18-20] 18 (07/18 0433) BP: (136-141)/(70-76) 141/76 mmHg (07/18 0433) SpO2:  [95 %-100 %] 95 % (07/18 0704)  Intake/Output from previous day: 07/17 0701 - 07/18 0700 In: 120 [P.O.:120] Out: 1200 [Urine:1200] Intake/Output this shift: Total I/O In: 120 [P.O.:120] Out: 250 [Urine:250]   Recent Labs  09/30/13 0603 10/01/13 0543 10/02/13 0622  HGB 11.7* 11.1* 10.7*    Recent Labs  10/01/13 0543 10/02/13 0622  WBC 6.7 8.5  RBC 3.90* 3.71*  HCT 33.2* 31.5*  PLT 180 198    Recent Labs  10/01/13 0543 10/02/13 0622  NA 137 134*  K 4.7 5.2  CL 101 98  CO2 27 28  BUN 15 15  CREATININE 0.87 0.79  GLUCOSE 119* 106*  CALCIUM 9.1 8.7    Recent Labs  10/01/13 0543 10/02/13 0622  INR 1.21 1.24    Neurovascular intact Sensation intact distally Intact pulses distally Dorsiflexion/Plantar flexion intact Incision: scant drainage  Hemovac removed.  Foley to be removed.  Wound OK.  He is up in chair.  Assessment/Plan: 2 Days Post-Op Procedure(s) (LRB): OPEN TREATMENT INTERNAL FIXATION OF RIGHT HIP FRACTURE (Right) Up with therapy  Natalya Domzalski 10/02/2013, 10:27 AM

## 2013-10-02 NOTE — Progress Notes (Signed)
ANTICOAGULATION CONSULT NOTE  Pharmacy Consult for Coumadin & Lovenox Indication: atrial fibrillation  No Known Allergies  Patient Measurements: Height: 5\' 2"  (157.5 cm) Weight: 124 lb 8 oz (56.473 kg) IBW/kg (Calculated) : 54.6  Vital Signs: Temp: 98.5 F (36.9 C) (07/18 0433) Temp src: Oral (07/18 0433) BP: 141/76 mmHg (07/18 0433) Pulse Rate: 68 (07/18 0433)  Labs:  Recent Labs  09/30/13 0603 10/01/13 0543 10/02/13 0622  HGB 11.7* 11.1* 10.7*  HCT 34.0* 33.2* 31.5*  PLT 179 180 198  LABPROT 15.0 15.3* 15.6*  INR 1.18 1.21 1.24  HEPARINUNFRC <0.10*  --   --   CREATININE 0.86 0.87 0.79    Estimated Creatinine Clearance: 51.2 ml/min (by C-G formula based on Cr of 0.79).  Medical History: Past Medical History  Diagnosis Date  . Rheumatic heart disease     moderate mitral stenosis and history of CHF  . Hypertension   . Sick sinus syndrome      Paroxysmal and then constant AF; slow rate on modest AV nodal blocking agents  . Chronic low back pain   . Atrial fibrillation     Per record has Afib/Aflutter with SSS  . PVD (peripheral vascular disease)     Right superficial femoral to peroneal artery bypass graft with nonreversed saphenous vein composite  2002  . Mesenteric ischemia     SMA artery embolectomy in 2002  . Dementia     mild/H&P (12/13/2011)  . Prostate cancer   . Shortness of breath     "@ night" (12/13/2011)  . Pacemaker    Medications:  Prescriptions prior to admission  Medication Sig Dispense Refill  . amLODipine (NORVASC) 5 MG tablet Take 1 tablet (5 mg total) by mouth daily.  90 tablet  3  . iron polysaccharides (NIFEREX) 150 MG capsule Take 150 mg by mouth daily.      Marland Kitchen olmesartan (BENICAR) 40 MG tablet Take 40 mg by mouth daily.      . TRAVATAN Z 0.004 % SOLN ophthalmic solution Place 1 drop into both eyes at bedtime.       Marland Kitchen warfarin (COUMADIN) 5 MG tablet Take 2.5-5 mg by mouth daily. Take 1 tablet (5mg ) daily except 1/2 tablet (2.5mg  ) on  Mon & Thurs      . donepezil (ARICEPT) 5 MG tablet Take 5 mg by mouth daily.      Marland Kitchen olmesartan (BENICAR) 40 MG tablet Take 1 tablet (40 mg total) by mouth daily.  30 tablet  6  . zolpidem (AMBIEN) 5 MG tablet Take 2.5 mg by mouth at bedtime as needed.       Assessment: 80yo male admitted with hip fx.  Pt is s/p rt ORIF on 7/16.   Pt is on chronic warfarin for h/o afib and home dose listed above.  This was resumed 7/17.  INR rising to goal.  Lovenox for VTE prophylaxis until INR at goal.   No bleeding noted.   Goal of Therapy:  Monitor platelets by anticoagulation protocol: Yes INR 2-3   Plan:  Lovenox 40mg  SQ q24hrs until INR at goal Coumadin 6mg  today x 1 INR daily Monitor CBC  Biagio Borg 10/02/2013,12:07 PM

## 2013-10-03 LAB — CBC
HEMATOCRIT: 34.9 % — AB (ref 39.0–52.0)
Hemoglobin: 11.7 g/dL — ABNORMAL LOW (ref 13.0–17.0)
MCH: 28.5 pg (ref 26.0–34.0)
MCHC: 33.5 g/dL (ref 30.0–36.0)
MCV: 85.1 fL (ref 78.0–100.0)
PLATELETS: 237 10*3/uL (ref 150–400)
RBC: 4.1 MIL/uL — AB (ref 4.22–5.81)
RDW: 14.9 % (ref 11.5–15.5)
WBC: 6.8 10*3/uL (ref 4.0–10.5)

## 2013-10-03 LAB — BASIC METABOLIC PANEL
ANION GAP: 9 (ref 5–15)
BUN: 16 mg/dL (ref 6–23)
CHLORIDE: 98 meq/L (ref 96–112)
CO2: 27 meq/L (ref 19–32)
Calcium: 9 mg/dL (ref 8.4–10.5)
Creatinine, Ser: 0.83 mg/dL (ref 0.50–1.35)
GFR calc Af Amer: 90 mL/min — ABNORMAL LOW (ref >90)
GFR calc non Af Amer: 77 mL/min — ABNORMAL LOW (ref >90)
Glucose, Bld: 103 mg/dL — ABNORMAL HIGH (ref 70–99)
Potassium: 4.5 mEq/L (ref 3.7–5.3)
Sodium: 134 mEq/L — ABNORMAL LOW (ref 137–147)

## 2013-10-03 LAB — PROTIME-INR
INR: 1.38 (ref 0.00–1.49)
Prothrombin Time: 17 seconds — ABNORMAL HIGH (ref 11.6–15.2)

## 2013-10-03 MED ORDER — WARFARIN SODIUM 6 MG PO TABS
6.0000 mg | ORAL_TABLET | Freq: Once | ORAL | Status: AC
  Administered 2013-10-03: 6 mg via ORAL
  Filled 2013-10-03: qty 1

## 2013-10-03 MED ORDER — POLYETHYLENE GLYCOL 3350 17 G PO PACK: 17.0000 g | PACK | Freq: Every day | ORAL | Status: DC

## 2013-10-03 MED ORDER — POLYETHYLENE GLYCOL 3350 17 G PO PACK
17.0000 g | PACK | Freq: Every day | ORAL | Status: DC
  Administered 2013-10-03 – 2013-10-04 (×2): 17 g via ORAL
  Filled 2013-10-03 (×2): qty 1

## 2013-10-03 MED ORDER — FLEET ENEMA 7-19 GM/118ML RE ENEM: 1.0000 | ENEMA | Freq: Every day | RECTAL | Status: DC | PRN

## 2013-10-03 MED ORDER — POLYETHYLENE GLYCOL 3350 17 G PO PACK: 17.0000 g | PACK | Freq: Two times a day (BID) | ORAL | Status: DC

## 2013-10-03 NOTE — Progress Notes (Signed)
ANTICOAGULATION CONSULT NOTE  Pharmacy Consult for Coumadin & Lovenox Indication: atrial fibrillation  No Known Allergies  Patient Measurements: Height: 5\' 2"  (157.5 cm) Weight: 124 lb 8 oz (56.473 kg) IBW/kg (Calculated) : 54.6  Vital Signs: Temp: 98 F (36.7 C) (07/19 0613) Temp src: Oral (07/19 0613) BP: 127/72 mmHg (07/19 0613) Pulse Rate: 61 (07/19 0613)  Labs:  Recent Labs  10/01/13 0543 10/02/13 0622 10/03/13 0642  HGB 11.1* 10.7* 11.7*  HCT 33.2* 31.5* 34.9*  PLT 180 198 237  LABPROT 15.3* 15.6* 17.0*  INR 1.21 1.24 1.38  CREATININE 0.87 0.79 0.83    Estimated Creatinine Clearance: 49.3 ml/min (by C-G formula based on Cr of 0.83).  Medical History: Past Medical History  Diagnosis Date  . Rheumatic heart disease     moderate mitral stenosis and history of CHF  . Hypertension   . Sick sinus syndrome      Paroxysmal and then constant AF; slow rate on modest AV nodal blocking agents  . Chronic low back pain   . Atrial fibrillation     Per record has Afib/Aflutter with SSS  . PVD (peripheral vascular disease)     Right superficial femoral to peroneal artery bypass graft with nonreversed saphenous vein composite  2002  . Mesenteric ischemia     SMA artery embolectomy in 2002  . Dementia     mild/H&P (12/13/2011)  . Prostate cancer   . Shortness of breath     "@ night" (12/13/2011)  . Pacemaker    Medications:  Prescriptions prior to admission  Medication Sig Dispense Refill  . amLODipine (NORVASC) 5 MG tablet Take 1 tablet (5 mg total) by mouth daily.  90 tablet  3  . iron polysaccharides (NIFEREX) 150 MG capsule Take 150 mg by mouth daily.      Marland Kitchen olmesartan (BENICAR) 40 MG tablet Take 40 mg by mouth daily.      . TRAVATAN Z 0.004 % SOLN ophthalmic solution Place 1 drop into both eyes at bedtime.       Marland Kitchen warfarin (COUMADIN) 5 MG tablet Take 2.5-5 mg by mouth daily. Take 1 tablet (5mg ) daily except 1/2 tablet (2.5mg  ) on Mon & Thurs      . donepezil  (ARICEPT) 5 MG tablet Take 5 mg by mouth daily.      Marland Kitchen olmesartan (BENICAR) 40 MG tablet Take 1 tablet (40 mg total) by mouth daily.  30 tablet  6  . zolpidem (AMBIEN) 5 MG tablet Take 2.5 mg by mouth at bedtime as needed.       Assessment: 80yo male admitted with hip fx.  Pt is s/p rt ORIF on 7/16.   Pt is on chronic warfarin for h/o afib and home dose listed above.  This was resumed 7/17.  INR slowly rising to goal.  Lovenox for VTE prophylaxis until INR at goal.   No bleeding noted.   Goal of Therapy:  Monitor platelets by anticoagulation protocol: Yes INR 2-3   Plan:  Lovenox 40mg  SQ q24hrs until INR >2 Repeat increased dose of Coumadin 6mg  today x 1 INR daily Monitor CBC  Biagio Borg 10/03/2013,9:46 AM

## 2013-10-03 NOTE — Progress Notes (Signed)
PROGRESS NOTE  [name] WFU:[id number] DOB: 1924/04/30 DOA: 09/25/2013 PCP: Leonides Grills, MD  HPI: Admitted 7/11 post fall with R hip fracture.   Subjective: - sitting up in chair, complaining of hip pain with movement, states no pain at rest  Past 24 h - no events  Assessment/Plan: Right hip fracture.  Management per orthopedics - drain removed 7/18 Possible UTI. Cultures negative Atrial fibrillation. Stable. Warfarin on hold, started heparin 7/14 for bridgning. 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, rheumatic heart disease,  Sick sinus syndrome status post pacemaker placement  Dementia   Diet: carb modified Fluids: none DVT Prophylaxis: heparin  Code Status: Full Family Communication: none this morning  Disposition Plan: inpatient, SNF Monday  Consultants:  Orthopedic surgery   Procedures:  None    Antibiotics Ceftriaxone 7/11 x 1 dose  Objective: Filed Vitals:   10/02/13 1919 10/02/13 2116 10/03/13 0613 10/03/13 0658  BP:  151/93 127/72   Pulse:  73 61   Temp:  97.8 F (36.6 C) 98 F (36.7 C)   TempSrc:  Oral Oral   Resp:  18 18   Height:      Weight:      SpO2: 99% 99% 100% 96%    Intake/Output Summary (Last 24 hours) at 10/03/13 1436 Last data filed at 10/03/13 1302  Gross per 24 hour  Intake    240 ml  Output    300 ml  Net    -60 ml   Filed Weights   09/25/13 2155 09/29/13 0703 09/30/13 0539  Weight: 55.974 kg (123 lb 6.4 oz) 55.339 kg (122 lb) 56.473 kg (124 lb 8 oz)   Exam:  General:  NAD  Cardiovascular: regular rate and rhythm, without MRG  Respiratory: good air movement, clear to auscultation anterior   Abdomen: soft, not tender to palpation, positive bowel sounds  MSK: no peripheral edema, dressing  intact  Data Reviewed: Basic Metabolic Panel:  Recent Labs Lab 09/30/13 0603 10/01/13 0543 10/02/13 0622 10/03/13 0642  NA 138 137 134* 134*  K 4.0 4.7 5.2 4.5  CL 102 101 98 98  CO2 27 27 28 27   GLUCOSE 103* 119* 106* 103*  BUN 15 15 15 16   CREATININE 0.86 0.87 0.79 0.83  CALCIUM 8.6 9.1 8.7 9.0   Liver Function Tests:  Recent Labs Lab 09/30/13 0603  AST 23  ALT 12  ALKPHOS 72  BILITOT 1.0  PROT 6.3  ALBUMIN 2.7*   CBC:  Recent Labs Lab 09/29/13 0647 09/30/13 0603 10/01/13 0543 10/02/13 0622 10/03/13 0642  WBC 5.4 4.6 6.7 8.5 6.8  NEUTROABS  --   --  4.8 6.0  --   HGB 11.7* 11.7* 11.1* 10.7* 11.7*  HCT 34.6* 34.0* 33.2* 31.5* 34.9*  MCV 83.6 83.7 85.1 84.9 85.1  PLT 190 179 180 198 237   CBG:  Recent Labs Lab 09/27/13 2141 09/28/13 2018  GLUCAP 123* 123*    Recent Results (from the past 240 hour(s))  URINE CULTURE     Status: None   Collection Time    09/25/13  4:05 PM      Result Value Ref Range Status   Specimen Description URINE, CATHETERIZED   Final   Special Requests NONE   Final   Culture  Setup Time     Final  Value: 09/26/2013 19:49     Performed at [place]     Final   Value: NO GROWTH     Performed at [facility]   Culture     Final   Value: NO GROWTH     Performed at [facility]   Report Status 09/27/2013 FINAL   Final     Studies: No results found.  Scheduled Meds: . albuterol  2.5 mg Nebulization BID  . amLODipine  5 mg Oral Daily  . docusate sodium  100 mg Oral BID  . donepezil  5 mg Oral Daily  . enoxaparin (LOVENOX) injection  40 mg Subcutaneous Q24H  . feeding supplement (GLUCERNA SHAKE)  237 mL Oral QHS  . irbesartan  300 mg Oral Daily  . iron polysaccharides  150 mg Oral Daily  . latanoprost  1 drop Both Eyes QHS  . polyethylene glycol  17 g Oral Daily  . warfarin  6 mg Oral Once  . Warfarin - Pharmacist Dosing Inpatient   Does not apply Q24H   Continuous  Infusions:    Active Problems:   HYPERLIPIDEMIA   Atrial fibrillation   Encounter for long-term (current) use of anticoagulants   Dementia   Fracture, intertrochanteric, right femur   Hip fracture  Time spent: 15  This note has been created with Surveyor, quantity. Any transcriptional errors are unintentional.   Marzetta Board, MD [facility] Pager [phone]. If 7 PM - 7 AM, please contact night-coverage at [email or web address], password [employer] 10/03/2013, 2:36 PM  LOS: 8 days

## 2013-10-03 NOTE — Progress Notes (Signed)
Patient making progress towards the goals, demonstrated decreased awareness of the medical condition and hence therapist explained patient several times about patient having fracture and surgery, poor cognitive level limits patient from achieving higher functional level , patient however pleasant and cooperative with therapy and understands reason of rehab, good participation with therapy.

## 2013-10-04 LAB — CBC
HEMATOCRIT: 33.7 % — AB (ref 39.0–52.0)
Hemoglobin: 11.3 g/dL — ABNORMAL LOW (ref 13.0–17.0)
MCH: 28.7 pg (ref 26.0–34.0)
MCHC: 33.5 g/dL (ref 30.0–36.0)
MCV: 85.5 fL (ref 78.0–100.0)
Platelets: 175 10*3/uL (ref 150–400)
RBC: 3.94 MIL/uL — AB (ref 4.22–5.81)
RDW: 15 % (ref 11.5–15.5)
WBC: 7.8 10*3/uL (ref 4.0–10.5)

## 2013-10-04 LAB — PROTIME-INR
INR: 1.91 — ABNORMAL HIGH (ref 0.00–1.49)
Prothrombin Time: 21.9 seconds — ABNORMAL HIGH (ref 11.6–15.2)

## 2013-10-04 MED ORDER — METHOCARBAMOL 500 MG PO TABS: 500.0000 mg | ORAL_TABLET | Freq: Four times a day (QID) | ORAL | Status: AC | PRN

## 2013-10-04 MED ORDER — ENOXAPARIN SODIUM 40 MG/0.4ML ~~LOC~~ SOLN: 40.0000 mg | SUBCUTANEOUS | Status: DC

## 2013-10-04 MED ORDER — GLUCERNA SHAKE PO LIQD: 237.0000 mL | Freq: Every day | ORAL | Status: AC

## 2013-10-04 MED ORDER — HYDROCODONE-ACETAMINOPHEN 5-325 MG PO TABS: 1.0000 | ORAL_TABLET | Freq: Four times a day (QID) | ORAL | Status: DC | PRN

## 2013-10-04 NOTE — Progress Notes (Signed)
UR chart review completed.  

## 2013-10-04 NOTE — Progress Notes (Signed)
[facility] arrived to transport the patient to [facility]  The patient's packet was given to the transporters.  The patient was cleansed prior to discharge and was transferred from the unit in stable condition.  The patients IV was removed prior to discharge.  Report was called later to Naomie Dean at the facility,  She verbalized understanding and was told that if there were any questions once the patient arrived she could call me here until 7 pm.  No further questions or concerns voiced at this time.  She did say that the nurse that would be having him was at lunch and was willing to take report.

## 2013-10-04 NOTE — Clinical Social Work Note (Signed)
Patient ready for discharge today, will transfer to [facility] via [place] [facility] after lunch today.  FL2 reviewed w RN and updated as needed, discharge summary faxed to facility.  Per Mardene Celeste, admissions, SNF has received insurance auth.  Son, Dick Hark, notified by phone and agreeable.  Discharge packet prepared and placed w shadow chart for transport.  CSW signing off as no further SW needs identified.  Edwyna Shell, LCSW Clinical Social Worker [phone])

## 2013-10-04 NOTE — Clinical Social Work Placement (Signed)
    Clinical Social Work Department CLINICAL SOCIAL WORK PLACEMENT NOTE 10/04/2013  Patient:  MORIO, WIDEN  Account Number:  [id number] Admit date:  09/25/2013  Clinical Social Worker:  Beverly Sessions  Date/time:  09/30/2013 08:51 AM  Clinical Social Work is seeking post-discharge placement for this patient at the following level of care:   SKILLED NURSING   (*CSW will update this form in Epic as items are completed)   09/30/2013  Patient/family provided with [facility] Department of Clinical Social Work's list of facilities offering this level of care within the geographic area requested by the patient (or if unable, by the patient's family).  09/30/2013  Patient/family informed of their freedom to choose among providers that offer the needed level of care, that participate in Medicare, Medicaid or managed care program needed by the patient, have an available bed and are willing to accept the patient.  09/30/2013  Patient/family informed of [facility]' ownership interest in [facility], as well as of the fact that they are under no obligation to receive care at this facility.  PASARR submitted to EDS on 09/30/2013 PASARR number received on 09/30/2013  FL2 transmitted to all facilities in geographic area requested by pt/family on  09/30/2013 FL2 transmitted to all facilities within larger geographic area on   Patient informed that his/her managed care company has contracts with or will negotiate with  certain facilities, including the following:     Patient/family informed of bed offers received:  09/30/2013 Patient chooses bed at [facility] SNF Physician recommends and patient chooses bed at  [facility]  Patient to be transferred to [facility] on  10/04/2013 Patient to be transferred to facility by [facility] Patient and family notified of transfer on 10/04/2013 Name of family member notified:  Theresia Lo  The  following physician request were entered in Epic:   Additional Comments:  Edwyna Shell, LCSW Clinical Social Worker [phone])

## 2013-10-04 NOTE — Care Management Note (Signed)
    Page 1 of 1   10/04/2013     9:38:54 AM CARE MANAGEMENT NOTE 10/04/2013  Patient:  [name], [name]   Account Number:  [id number]  Date Initiated:  09/28/2013  Documentation initiated by:  Vladimir Creeks  Subjective/Objective Assessment:   Admitted with fx hip. he lives at home with family, and has an aide to assist. He has dementia.     Action/Plan:   he may need SNF at D/C. Will continue to follow   Anticipated DC Date:  10/02/2013   Anticipated DC Plan:  [place] referral  Clinical Social Worker      DC Planning Services  CM consult      Choice offered to / List presented to:             Status of service:  Completed, signed off Medicare Important Message given?  YES (If response is "NO", the following Medicare IM given date fields will be blank) Date Medicare IM given:  10/04/2013 Medicare IM given by:  Theophilus Kinds Date Additional Medicare IM given:   Additional Medicare IM given by:    Discharge Disposition:  [place]  Per UR Regulation:  Reviewed for med. necessity/level of care/duration of stay  If discussed at [place] of Stay Meetings, dates discussed:   09/30/2013    Comments:  10/04/13 0940 Christinia Gully, RN BSN CM Pt discharged to [facility] today. CSW to arrange discharge to facility.  09/28/13 1400 Vladimir Creeks RN/CM

## 2013-10-04 NOTE — Progress Notes (Signed)
Subjective: 4 Days Post-Op Procedure(s) (LRB): OPEN TREATMENT INTERNAL FIXATION OF RIGHT HIP FRACTURE (Right) Patient reports pain as mild.    Objective: Vital signs in last 24 hours: Temp:  [98 F (36.7 C)-98.2 F (36.8 C)] 98.1 F (36.7 C) (07/20 0508) Pulse Rate:  [63-69] 63 (07/20 0508) Resp:  [18-20] 20 (07/20 0508) BP: (130-149)/(62-76) 147/62 mmHg (07/20 0508) SpO2:  [97 %-100 %] 100 % (07/20 0508) Weight:  [59.467 kg (131 lb 1.6 oz)] 59.467 kg (131 lb 1.6 oz) (07/20 0508)  Intake/Output from previous day: 07/19 0701 - 07/20 0700 In: 240 [P.O.:240] Out: 500 [Urine:500] Intake/Output this shift:     Recent Labs  10/02/13 0622 10/03/13 0642  HGB 10.7* 11.7*    Recent Labs  10/02/13 0622 10/03/13 0642  WBC 8.5 6.8  RBC 3.71* 4.10*  HCT 31.5* 34.9*  PLT 198 237    Recent Labs  10/02/13 0622 10/03/13 0642  NA 134* 134*  K 5.2 4.5  CL 98 98  CO2 28 27  BUN 15 16  CREATININE 0.79 0.83  GLUCOSE 106* 103*  CALCIUM 8.7 9.0    Recent Labs  10/02/13 0622 10/03/13 0642  INR 1.24 1.38    Neurovascular intact Sensation intact distally Intact pulses distally Dorsiflexion/Plantar flexion intact Incision: no drainage   He can be discharged to SNF.  He will need to continue the enoxaparin daily for one month total.  He will need to have toe touch with walker to the hip on the right.  He will need to have staples removed July 27th.  Steri-strip wound.  I will need to see him in my office in one month.  X-rays of the right hip prior to the appointment.  Bring x-rays with him to the office. Assessment/Plan: 4 Days Post-Op Procedure(s) (LRB): OPEN TREATMENT INTERNAL FIXATION OF RIGHT HIP FRACTURE (Right) Discharge to SNF  Annie Jeffrey [facility] 10/04/2013, 7:22 AM

## 2013-10-04 NOTE — Discharge Summary (Addendum)
Physician Discharge Summary  [name] WCH:[id number] DOB: Nov 21, 1924 DOA: 09/25/2013  PCP: Leonides Grills, MD  Admit date: 09/25/2013 Discharge date: 10/04/2013  Time spent: 35 minutes  Recommendations for Outpatient Follow-up:  1. Follow up with Dr. Luna Glasgow from Orthopedic surgery in 1 month 2. Follow up with PCP in 1-2 weeks  3. Continue Lovenox until INR > 2 then continue Coumadin only for chronic anticoagulation. INR on discharge 1.91  Discharge Diagnoses:  Active Problems:   HYPERLIPIDEMIA   Atrial fibrillation   Encounter for long-term (current) use of anticoagulants   Dementia   Fracture, intertrochanteric, right femur   Hip fracture  Discharge Condition: stable  Diet recommendation: regular  Filed Weights   09/29/13 0703 09/30/13 0539 10/04/13 0508  Weight: 55.339 kg (122 lb) 56.473 kg (124 lb 8 oz) 59.467 kg (131 lb 1.6 oz)    History of present illness:  [name] is a 80 y.o. male with a baseline history of dementia presented to the ED after taking a fall. The result is a fracture of his right hip. Patient is also on coumadin and according to the INR has been therapeutic. Orthopedics has seen the patient and would like to hold off on surgery until his INR is normal. Patient is not able to provide any history and there is no family present. Patient apparently tripped and did not lose consciousness. He did not have any other complaints.  Hospital Course:  Patient was admitted with an right intertochanteric fracture following a fall. Dr. Luna Glasgow with orthopedic surgery was consulted and followed patient while hospitalized. Patient has a history of atrial fibrillation, previous LA thrombus in 2013 and history of SMA embolus and is on chronic coumadin. His Coumadin was help in preparation for surgery and he was bridges with heparin until his INR was within normal range, then he underwent an open treatment internal fixation of right hip fracture on 7/16.  Following the surgery he had a drain placed which was removed on 7/18. His hemoglobin has remained stable after the surgery without requirements for blood transfusions. 14 h post op he was started on Lovenox and Coumadin which should be continued until his INR reaches > 2. He was evaluated by PT who recommended SNF on discharge. He is clinically stable and ready for discharge.   Procedures:  ORIP R hip 7/16    Consultations:  Orthopedic surgery   Discharge Exam: Filed Vitals:   10/03/13 1454 10/03/13 1932 10/03/13 2135 10/04/13 0508  BP: 130/75  149/76 147/62  Pulse: 65  69 63  Temp: 98 F (36.7 C)  98.2 F (36.8 C) 98.1 F (36.7 C)  TempSrc: Oral  Oral Oral  Resp: 18  20 20   Height:      Weight:    59.467 kg (131 lb 1.6 oz)  SpO2: 97% 97% 100% 100%    General: NAD Cardiovascular: regular Respiratory: CTA biL  Discharge Instructions     Medication List    STOP taking these medications       zolpidem 5 MG tablet  Commonly known as:  AMBIEN      TAKE these medications       amLODipine 5 MG tablet  Commonly known as:  NORVASC  Take 1 tablet (5 mg total) by mouth daily.     donepezil 5 MG tablet  Commonly known as:  ARICEPT  Take 5 mg by mouth daily.     enoxaparin 40 MG/0.4ML injection  Commonly known as:  LOVENOX  Inject 0.4 mLs (40 mg total) into the skin daily.     feeding supplement (GLUCERNA SHAKE) Liqd  Take 237 mLs by mouth at bedtime.     HYDROcodone-acetaminophen 5-325 MG per tablet  Commonly known as:  NORCO/VICODIN  Take 1-2 tablets by mouth every 6 (six) hours as needed for moderate pain.     iron polysaccharides 150 MG capsule  Commonly known as:  NIFEREX  Take 150 mg by mouth daily.     methocarbamol 500 MG tablet  Commonly known as:  ROBAXIN  Take 1 tablet (500 mg total) by mouth every 6 (six) hours as needed for muscle spasms.     olmesartan 40 MG tablet  Commonly known as:  BENICAR  Take 40 mg by mouth daily.     olmesartan 40  MG tablet  Commonly known as:  BENICAR  Take 1 tablet (40 mg total) by mouth daily.     TRAVATAN Z 0.004 % Soln ophthalmic solution  Generic drug:  Travoprost (BAK Free)  Place 1 drop into both eyes at bedtime.     warfarin 5 MG tablet  Commonly known as:  COUMADIN  Take 2.5-5 mg by mouth daily. Take 1 tablet (5mg ) daily except 1/2 tablet (2.5mg  ) on Mon & Thurs         The results of significant diagnostics from this hospitalization (including imaging, microbiology, ancillary and laboratory) are listed below for reference.    Significant Diagnostic Studies: Dg Chest 1 View  09/25/2013   CLINICAL DATA:  Fall  EXAM: CHEST - 1 VIEW  COMPARISON:  12/17/2011  FINDINGS: There is stable moderately enlarged cardiac silhouette. Left chest wall pacer device with a single lead projecting over the expected location of the right ventricular apex is stable. Hilar contours are stable. Pulmonary vascularity appears within limits. No focal airspace disease or evidence of pulmonary edema. No visible pleural effusion.  IMPRESSION: Stable moderate cardiomegaly and stable single lead pacer device. No acute superimposed abnormality identified.   Electronically Signed   By: Curlene Dolphin M.D.   On: 09/25/2013 15:27   Dg Hip Complete Right  09/25/2013   CLINICAL DATA:  Post fall, now with right-sided hip pain  EXAM: RIGHT HIP - COMPLETE 2+ VIEW  COMPARISON:  None.  FINDINGS: There is a minimally displaced right-sided intertrochanteric femur fracture with associated foreshortening and accentuated varus angulation. No definitive intra-articular extension. No definite displaced pelvic fracture. Degenerative changes suspected within the lower lumbar spine though incompletely evaluated.  There are multiple presumed dermal and subcutaneous dystrophic calcifications about the buttocks bilaterally. Multiple phleboliths overlie the lower pelvis bilaterally. No definite radiopaque foreign body.  IMPRESSION: Acute minimally  displaced right-sided intertrochanteric femur fracture with associated foreshortening and accentuated varus angulation.   Electronically Signed   By: Sandi Mariscal M.D.   On: 09/25/2013 15:24   Dg Hip Operative Right  09/30/2013   CLINICAL DATA:  ORIF right hip fracture  EXAM: DG OPERATIVE RIGHT HIP  FLUOROSCOPY TIME:  33 seconds  TECHNIQUE: A single spot fluoroscopic AP image of the right hip is submitted.  COMPARISON:  None.  FINDINGS: Interval placement of a proximal right lateral sideplate and interlocking femoral neck dynamic compression screw transfixing a intertrochanteric fracture. No hardware failure or complication.  IMPRESSION: ORIF right intertrochanteric fracture.   Electronically Signed   By: Kathreen Devoid   On: 09/30/2013 09:10   Ct Head Wo Contrast  09/25/2013   CLINICAL DATA:  Fall, on Coumadin  EXAM: CT  HEAD WITHOUT CONTRAST  TECHNIQUE: Contiguous axial images were obtained from the base of the skull through the vertex without intravenous contrast.  COMPARISON:  12/13/2011  FINDINGS: Generalized atrophy.  Normal ventricular morphology.  No midline shift or mass effect.  Extensive small vessel chronic ischemic changes of deep cerebral white matter.  No intracranial hemorrhage, mass lesion or evidence acute infarction.  No extra-axial fluid collections.  Atherosclerotic calcifications at skullbase.  Mucosal thickening in a few RIGHT ethmoid air cells.  Bones and sinuses otherwise unremarkable.  IMPRESSION: Atrophy with small vessel chronic ischemic changes of deep cerebral white matter.  No acute intracranial abnormalities.   Electronically Signed   By: Lavonia Dana M.D.   On: 09/25/2013 17:30    Microbiology: Recent Results (from the past 240 hour(s))  URINE CULTURE     Status: None   Collection Time    09/25/13  4:05 PM      Result Value Ref Range Status   Specimen Description URINE, CATHETERIZED   Final   Special Requests NONE   Final   Culture  Setup Time     Final   Value:  09/26/2013 19:49     Performed at [place]     Final   Value: NO GROWTH     Performed at [facility]   Culture     Final   Value: NO GROWTH     Performed at [facility]   Report Status 09/27/2013 FINAL   Final     Labs: Basic Metabolic Panel:  Recent Labs Lab 09/30/13 0603 10/01/13 0543 10/02/13 0622 10/03/13 0642  NA 138 137 134* 134*  K 4.0 4.7 5.2 4.5  CL 102 101 98 98  CO2 27 27 28 27   GLUCOSE 103* 119* 106* 103*  BUN 15 15 15 16   CREATININE 0.86 0.87 0.79 0.83  CALCIUM 8.6 9.1 8.7 9.0   Liver Function Tests:  Recent Labs Lab 09/30/13 0603  AST 23  ALT 12  ALKPHOS 72  BILITOT 1.0  PROT 6.3  ALBUMIN 2.7*   CBC:  Recent Labs Lab 09/30/13 0603 10/01/13 0543 10/02/13 0622 10/03/13 0642 10/04/13 0708  WBC 4.6 6.7 8.5 6.8 7.8  NEUTROABS  --  4.8 6.0  --   --   HGB 11.7* 11.1* 10.7* 11.7* 11.3*  HCT 34.0* 33.2* 31.5* 34.9* 33.7*  MCV 83.7 85.1 84.9 85.1 85.5  PLT 179 180 198 237 175   CBG:  Recent Labs Lab 09/27/13 2141 09/28/13 2018  GLUCAP 123* 123*    Signed:  GHERGHE, COSTIN  [facility] 10/04/2013, 12:58 PM

## 2013-10-05 NOTE — Progress Notes (Signed)
UR chart review completed.  

## 2013-11-04 ENCOUNTER — Encounter: Payer: Self-pay | Admitting: *Deleted

## 2013-12-02 ENCOUNTER — Encounter: Payer: Medicare HMO | Admitting: Internal Medicine

## 2013-12-24 ENCOUNTER — Encounter: Payer: Self-pay | Admitting: *Deleted

## 2014-01-04 ENCOUNTER — Encounter: Payer: Self-pay | Admitting: Vascular Surgery

## 2014-01-06 ENCOUNTER — Encounter: Payer: Self-pay | Admitting: Vascular Surgery

## 2014-01-07 ENCOUNTER — Encounter: Payer: Self-pay | Admitting: Vascular Surgery

## 2014-01-07 ENCOUNTER — Other Ambulatory Visit: Payer: Self-pay

## 2014-01-07 ENCOUNTER — Ambulatory Visit (INDEPENDENT_AMBULATORY_CARE_PROVIDER_SITE_OTHER): Payer: Medicare HMO | Admitting: Vascular Surgery

## 2014-01-07 VITALS — BP 133/80 | HR 71 | Temp 97.0°F | Ht 62.0 in | Wt 103.0 lb

## 2014-01-07 DIAGNOSIS — I70269 Atherosclerosis of native arteries of extremities with gangrene, unspecified extremity: Secondary | ICD-10-CM | POA: Insufficient documentation

## 2014-01-07 NOTE — Progress Notes (Signed)
Referred by:  Elsie Lincoln, MD [address] [place], [place] [address]  Reason for referral: right foot ischemia  History of Present Illness  [name] is a 80 y.o. (07/23/1924) male s/p prior SMA thrombectomy with Dr. Scot Dock who presents with chief complaint: constant pain in R foot.  Pt has dementia so much of his history is obtained from his son with him.  Onset of symptom occurred unknown period of months ago.  Pt current in SNF after R hip fracture and subsequent repair.  Pain is described as sharp, severity 6-10/10, and associated with any movement of R foot.  Patient has attempted to treat this pain with Tramadol.  The patient has rest pain symptoms also and Right foot ulcers.  Atherosclerotic risk factors include: HTN.  Past Medical History  Diagnosis Date  . Rheumatic heart disease     moderate mitral stenosis and history of CHF  . Hypertension   . Sick sinus syndrome      Paroxysmal and then constant AF; slow rate on modest AV nodal blocking agents  . Chronic low back pain   . Atrial fibrillation     Per record has Afib/Aflutter with SSS  . PVD (peripheral vascular disease)     Right superficial femoral to peroneal artery bypass graft with nonreversed saphenous vein composite  2002  . Mesenteric ischemia     SMA artery embolectomy in 2002  . Dementia     mild/H&P (12/13/2011)  . Prostate cancer   . Shortness of breath     "@ night" (12/13/2011)  . Pacemaker     Past Surgical History  Procedure Laterality Date  . Transurethral resection of prostate    . Embolectomy  2002    SMA artery  . Femoral-peroneal bypass graft  2002    right superficial (12/13/2011)  . Orif hip fracture Right 09/30/2013    Procedure: OPEN TREATMENT INTERNAL FIXATION OF RIGHT HIP FRACTURE;  Surgeon: Sanjuana Kava, MD;  Location: [facility];  Service: Orthopedics;  Laterality: Right;    History   Social History  . Marital Status: Widowed    Spouse Name: N/A    Number of  Children: N/A  . Years of Education: N/A   Occupational History  . retired     yard work Customer service manager   Social History Main Topics  . Smoking status: Never Smoker   . Smokeless tobacco: Never Used  . Alcohol Use: No  . Drug Use: No  . Sexual Activity: No   Other Topics Concern  . Not on file   Social History Narrative  . No narrative on file    Family History: cannot be obtained due to patient's dementia  Current Outpatient Prescriptions on File Prior to Visit  Medication Sig Dispense Refill  . amLODipine (NORVASC) 5 MG tablet Take 1 tablet (5 mg total) by mouth daily.  90 tablet  3  . donepezil (ARICEPT) 5 MG tablet Take 5 mg by mouth daily.      Marland Kitchen HYDROcodone-acetaminophen (NORCO/VICODIN) 5-325 MG per tablet Take 1-2 tablets by mouth every 6 (six) hours as needed for moderate pain.  30 tablet  0  . methocarbamol (ROBAXIN) 500 MG tablet Take 1 tablet (500 mg total) by mouth every 6 (six) hours as needed for muscle spasms.  20 tablet  0  . TRAVATAN Z 0.004 % SOLN ophthalmic solution Place 1 drop into both eyes at bedtime.       Marland Kitchen warfarin (COUMADIN) 5 MG  tablet Take 2.5-5 mg by mouth daily. Take 1 tablet (5mg ) daily except 1/2 tablet (2.5mg  ) on Mon & Thurs      . enoxaparin (LOVENOX) 40 MG/0.4ML injection Inject 0.4 mLs (40 mg total) into the skin daily.  0 Syringe    . feeding supplement, GLUCERNA SHAKE, (GLUCERNA SHAKE) LIQD Take 237 mLs by mouth at bedtime.    0  . iron polysaccharides (NIFEREX) 150 MG capsule Take 150 mg by mouth daily.      Marland Kitchen olmesartan (BENICAR) 40 MG tablet Take 1 tablet (40 mg total) by mouth daily.  30 tablet  6  . olmesartan (BENICAR) 40 MG tablet Take 40 mg by mouth daily.       No current facility-administered medications on file prior to visit.    No Known Allergies  REVIEW OF SYSTEMS:  (Positives checked otherwise negative)  CARDIOVASCULAR:  []  chest pain, []  chest pressure, []  palpitations, []  shortness of breath when laying flat, []   shortness of breath with exertion,  [x]  pain in feet when walking, [x]  pain in feet when laying flat, []  history of blood clot in veins (DVT), []  history of phlebitis, []  swelling in legs, []  varicose veins, [x]  cardiac arrhythmia   PULMONARY:  []  productive cough, []  asthma, []  wheezing  NEUROLOGIC:  []  weakness in arms or legs, []  numbness in arms or legs, []  difficulty speaking or slurred speech, []  temporary loss of vision in one eye, []  dizziness  HEMATOLOGIC:  []  bleeding problems, []  problems with blood clotting too easily  MUSCULOSKEL:  [x]  joint pain, []  joint swelling  GASTROINTEST:  []  vomiting blood, []  blood in stool     GENITOURINARY:  []  burning with urination, []  blood in urine  PSYCHIATRIC:  []  history of major depression  INTEGUMENTARY:  []  rashes, [x]  ulcers  CONSTITUTIONAL:  []  fever, []  chills  For VQI Use Only  PRE-ADM LIVING: Nursing home  AMB STATUS: Wheelchair  CAD Sx: None  PRIOR CHF: None  STRESS TEST: [x]  No, [ ]  Normal, [ ]  + ischemia, [ ]  + MI, [ ]  Both   Physical Examination  Filed Vitals:   01/07/14 0922  BP: 133/80  Pulse: 71  Temp: 97 F (36.1 C)  TempSrc: Oral  Height: 5\' 2"  (1.575 m)  Weight: 103 lb (46.72 kg)  SpO2: 100%   Body mass index is 18.83 kg/(m^2).  General: alert, obviously demented, WD, elderly  Head: [place]/AT, Temporalis wasting,   Ear/Nose/Throat: Hearing grossly intact, nares w/o erythema or drainage, oropharynx cannot be examined as pt can't cooperate  Eyes:  Unable to cooperate with exam due to dementia  Neck: Supple, no nuchal rigidity, no palpable LAD, rotated on left tonically  Pulmonary: Sym exp, good air movt, CTAB, no rales, rhonchi, & wheezing  Cardiac: RRR, Nl S1, S2, no Murmurs, rubs or gallops  Vascular: Vessel Right Left  Radial Faintly Palpable Faintly Palpable  Brachial Not Palpable Not Palpable  Carotid Palpable, without bruit Cannot exam due to neck position  Aorta Not palpable N/A    Femoral Palpable Palpable  Popliteal Not palpable Not palpable  PT Not Palpable Not Palpable  DP Not Palpable Not Palpable   Gastrointestinal: soft, NTND, -G/R, - HSM, - masses, - CVAT B, prior incision healed  Musculoskeletal: M/S exam not possible due to inability to cooperate, does spontaneous move L arm and moves L leg to command, with severe pain can extend R leg but contracture evident in knee, R foot cool with  blistering in toes, ulcers over anterior ankle and smaller ulcer over lateral surface of foot, no drainage, ischemic skin extends to mid-calf  Neurologic: cannot obtain due to inability to cooperate due dementia  Psychiatric: cannot obtain due to inability to cooperate due dementia  Dermatologic: See M/S exam for extremity exam, no rashes otherwise noted  Lymph : No Cervical, Axillary, or Inguinal lymphadenopathy    Outside Studies/Documentation 8 pages of outside documents were reviewed including: Nursing home record and tracing of BLE pedal signals Medical Decision Making  CHRISTERPHER CLOS is a 79 y.o. male who presents with: R leg ischemia with gangrene (suspect R foot in the process of dying), LLE PAD   Realistically, this patient options are: 1.  Palliative management narcotics, abx, and wound care 2.  Palliative R AKA (no point to BKA given contracture)   Given the severity of this patient's sx, I doubt #1 will be a viable option for long. I discussed in depth the nature of above-the-knee amputation with the patient, including risks, benefits, and alternatives.   The patient and family is aware that the risks of above-the-knee amputation include but are not limited to: bleeding, infection, myocardial infarction, stroke, death, failure to heal amputation wound, and possible need for more proximal amputation.   The patient and family is aware of the risks.  He is tenatively scheduled for this coming Tuesday but the family is going to hold a family meeting to make a  final decision over the weekend.   Adele Barthel, MD [facility] Office: [phone] Pager: [phone]  01/07/2014, 9:56 AM

## 2014-01-10 ENCOUNTER — Inpatient Hospital Stay (HOSPITAL_COMMUNITY): Payer: Medicare HMO | Admitting: Vascular Surgery

## 2014-01-10 ENCOUNTER — Encounter (HOSPITAL_COMMUNITY): Payer: Self-pay | Admitting: *Deleted

## 2014-01-10 ENCOUNTER — Encounter (HOSPITAL_COMMUNITY): Payer: Medicare HMO | Admitting: Vascular Surgery

## 2014-01-10 MED ORDER — DEXTROSE 5 % IV SOLN
1.5000 g | INTRAVENOUS | Status: DC
  Filled 2014-01-10: qty 1.5

## 2014-01-10 MED ORDER — SODIUM CHLORIDE 0.9 % IV SOLN: INTRAVENOUS | Status: DC

## 2014-01-10 NOTE — Progress Notes (Signed)
Pacemaker form not faxed back. Spoke with Dr. Massage he advised would not need the device rep in the morning. Also, left message with other contact listed to ensure they knew we would need them here at 05:30 with patient.

## 2014-01-10 NOTE — Progress Notes (Signed)
Anesthesia Chart Review:  Patient is a 80 year old male scheduled for right AKA tomorrow (first case) by Dr. Bridgett Larsson. He is scheduled to be a same day work-up.  He is a resident at [facility]. Patient was just seen by Dr. Bridgett Larsson this past Friday 01/07/14. According to his note, Dr. Bridgett Larsson felt that palliative management with antibiotics, pain medication, and wound care would likely not be a viable option for long, so palliative right AKA was recommended.   History includes afib, SSS, [employer] PPM 12/13/11, rheumatic heart disease with severe mitral stenosis 11/2011 echo, CHF, HTN, PVD s/p right FPBG '02, mesenteric ischemia s/p SMA embolectomy '02, dementia, prostate cancer, non-smoker, ORIF right hip fracture 09/30/13 (he was cleared with relatively high risk by [facility] prior to that surgery, but cardiology was not consulted).  Primary cardiologist is Dr. Lattie Haw, last visit with Jory Sims, NP on 01/23/12 with planned one year follow-up with echo.  He is also followed by EP cardiology, last visit with Dr. Lovena Le was on 12/14/12.  Med list is not yet updated by pharmacy.  PAT RN (via phone) confirmed Coumadin has been on hold since 01/06/14.  EKG on 09/25/13 showed v-paced rhythm. T wave inversion is new in V2, V6, when compared to 12/17/11 EKG. Rhythm appears regular although consistent p waves are not well seen.  Echo on 12/14/11 showed:  - Left ventricle: The cavity size was normal. Wall thickness was normal. Systolic function was normal. The estimated ejection fraction was in the range of 55% to 60%. Wall motion was normal; there were no regional wall motion abnormalities. - Aortic valve: Mild regurgitation. - Mitral valve: Calcified annulus. Mild thickening, consistent with rheumatic disease. The findings are consistent with severe stenosis. Mild regurgitation. Valve area by pressure half-time: 1.02cm^2. - Left atrium: The atrium was severely dilated. - Right ventricle: The cavity  size was mildly dilated. - Right atrium: The atrium was severely dilated. - Pulmonary arteries: Systolic pressure was moderately to severely increased. PA peak pressure: 17mm Hg (S). - Pericardium, extracardiac: A trivial pericardial effusion was identified. Impressions: Spontaneous contrast and probable thrombus noted in left atrium.  1V CXR on 09/25/13 showed: Stable moderate cardiomegaly and stable single lead pacer device. No acute superimposed abnormality identified.  He is for labs on the day of surgery.   I reviewed chart with anesthesiologist Dr. Orene Desanctis earlier today.  He has paged Dr. Bridgett Larsson twice today to discuss case further, but did not receive a call back.  With patient's significant cardiac history, dementia, and being a same day work-up, he is not the best candidate to be a first case start. He did tolerate ORIF of his right hip ~ 3 month, but depending on exam findings and subjective information could ultimately need cardiology input. Formal decision following anesthesia evaluation tomorrow.  Of note, there was communication with [facility] Michaele Offer, RN and Pablo Ledger, PA-C in regards to whether or not we could move Mr. Maser to a second case since Dr. Lianne Moris second case was in-patient; however, as of this morning that patient (second case) was not wanting to proceed with surgery so they will re-evaluate in the morning.  Arbie Cookey is going to attempt to let Dr. Bridgett Larsson know that there could be a potential delay in the start of this case.    George Hugh [facility] Short Stay Center/Anesthesiology Phone [phone] 01/10/2014 5:19 PM

## 2014-01-10 NOTE — Progress Notes (Signed)
History and instructions verified with Ivin Booty, RN at [facility]. Left message with son also requesting that he arrive at 05:30 to sign paperwork (consent). Requested that Ivin Booty send most recent MAR with last dose of medications given, verification of NPO status, History, and any CXR, EKG, cardiac test with patient. Also, notified her that we did not open until 05:30 and to ensure transport was aware of same.

## 2014-01-11 ENCOUNTER — Encounter (HOSPITAL_COMMUNITY)
Admission: RE | Disposition: A | Discharge status: Skilled Nursing Facility | Payer: Self-pay | Source: Ambulatory Visit | Attending: Vascular Surgery

## 2014-01-11 ENCOUNTER — Encounter (HOSPITAL_COMMUNITY): Payer: Self-pay | Admitting: Certified Registered Nurse Anesthetist

## 2014-01-11 ENCOUNTER — Inpatient Hospital Stay (HOSPITAL_COMMUNITY)
Admission: RE | Admit: 2014-01-11 | Discharge: 2014-01-14 | DRG: 239 | Disposition: A | Discharge status: Skilled Nursing Facility | Payer: Medicare HMO | Source: Ambulatory Visit | Attending: Vascular Surgery | Admitting: Vascular Surgery

## 2014-01-11 DIAGNOSIS — I70261 Atherosclerosis of native arteries of extremities with gangrene, right leg: Secondary | ICD-10-CM | POA: Diagnosis not present

## 2014-01-11 DIAGNOSIS — I482 Chronic atrial fibrillation, unspecified: Secondary | ICD-10-CM

## 2014-01-11 DIAGNOSIS — Z950 Presence of cardiac pacemaker: Secondary | ICD-10-CM

## 2014-01-11 DIAGNOSIS — I10 Essential (primary) hypertension: Secondary | ICD-10-CM | POA: Diagnosis present

## 2014-01-11 DIAGNOSIS — M79671 Pain in right foot: Secondary | ICD-10-CM | POA: Diagnosis present

## 2014-01-11 DIAGNOSIS — Z681 Body mass index (BMI) 19 or less, adult: Secondary | ICD-10-CM | POA: Diagnosis not present

## 2014-01-11 DIAGNOSIS — I050 Rheumatic mitral stenosis: Secondary | ICD-10-CM | POA: Diagnosis present

## 2014-01-11 DIAGNOSIS — L97319 Non-pressure chronic ulcer of right ankle with unspecified severity: Secondary | ICD-10-CM | POA: Diagnosis present

## 2014-01-11 DIAGNOSIS — I509 Heart failure, unspecified: Secondary | ICD-10-CM | POA: Diagnosis present

## 2014-01-11 DIAGNOSIS — I4891 Unspecified atrial fibrillation: Secondary | ICD-10-CM | POA: Diagnosis present

## 2014-01-11 DIAGNOSIS — Z96641 Presence of right artificial hip joint: Secondary | ICD-10-CM | POA: Diagnosis present

## 2014-01-11 DIAGNOSIS — L97519 Non-pressure chronic ulcer of other part of right foot with unspecified severity: Secondary | ICD-10-CM | POA: Diagnosis present

## 2014-01-11 DIAGNOSIS — Z7901 Long term (current) use of anticoagulants: Secondary | ICD-10-CM

## 2014-01-11 DIAGNOSIS — I495 Sick sinus syndrome: Secondary | ICD-10-CM | POA: Diagnosis not present

## 2014-01-11 DIAGNOSIS — Z7401 Bed confinement status: Secondary | ICD-10-CM

## 2014-01-11 DIAGNOSIS — F0390 Unspecified dementia without behavioral disturbance: Secondary | ICD-10-CM | POA: Diagnosis present

## 2014-01-11 DIAGNOSIS — Z79899 Other long term (current) drug therapy: Secondary | ICD-10-CM

## 2014-01-11 DIAGNOSIS — E43 Unspecified severe protein-calorie malnutrition: Secondary | ICD-10-CM | POA: Diagnosis present

## 2014-01-11 LAB — COMPREHENSIVE METABOLIC PANEL
ALT: 14 U/L (ref 0–53)
ANION GAP: 13 (ref 5–15)
AST: 24 U/L (ref 0–37)
Albumin: 2.9 g/dL — ABNORMAL LOW (ref 3.5–5.2)
Alkaline Phosphatase: 92 U/L (ref 39–117)
BILIRUBIN TOTAL: 0.4 mg/dL (ref 0.3–1.2)
BUN: 26 mg/dL — AB (ref 6–23)
CHLORIDE: 107 meq/L (ref 96–112)
CO2: 25 meq/L (ref 19–32)
Calcium: 9.4 mg/dL (ref 8.4–10.5)
Creatinine, Ser: 0.95 mg/dL (ref 0.50–1.35)
GFR calc non Af Amer: 73 mL/min — ABNORMAL LOW (ref >90)
GFR, EST AFRICAN AMERICAN: 84 mL/min — AB (ref >90)
GLUCOSE: 98 mg/dL (ref 70–99)
POTASSIUM: 4.3 meq/L (ref 3.7–5.3)
SODIUM: 145 meq/L (ref 137–147)
Total Protein: 7.2 g/dL (ref 6.0–8.3)

## 2014-01-11 LAB — CBC
HEMATOCRIT: 37.7 % — AB (ref 39.0–52.0)
HEMOGLOBIN: 11.9 g/dL — AB (ref 13.0–17.0)
MCH: 27.6 pg (ref 26.0–34.0)
MCHC: 31.6 g/dL (ref 30.0–36.0)
MCV: 87.5 fL (ref 78.0–100.0)
Platelets: 222 10*3/uL (ref 150–400)
RBC: 4.31 MIL/uL (ref 4.22–5.81)
RDW: 14.7 % (ref 11.5–15.5)
WBC: 5.4 10*3/uL (ref 4.0–10.5)

## 2014-01-11 LAB — PROTIME-INR
INR: 2.05 — ABNORMAL HIGH (ref 0.00–1.49)
INR: 2.02 — ABNORMAL HIGH (ref 0.00–1.49)
PROTHROMBIN TIME: 23 s — AB (ref 11.6–15.2)
Prothrombin Time: 23.3 seconds — ABNORMAL HIGH (ref 11.6–15.2)

## 2014-01-11 LAB — APTT: APTT: 47 s — AB (ref 24–37)

## 2014-01-11 LAB — PRO B NATRIURETIC PEPTIDE: PRO B NATRI PEPTIDE: 816.8 pg/mL — AB (ref 0–450)

## 2014-01-11 SURGERY — AMPUTATION, ABOVE KNEE
Anesthesia: Spinal

## 2014-01-11 MED ORDER — ONDANSETRON HCL 4 MG/2ML IJ SOLN: 4.0000 mg | Freq: Four times a day (QID) | INTRAMUSCULAR | Status: DC | PRN

## 2014-01-11 MED ORDER — VITAMIN K1 10 MG/ML IJ SOLN
1.0000 mg | Freq: Once | INTRAVENOUS | Status: AC
  Administered 2014-01-11: 1 mg via INTRAVENOUS
  Filled 2014-01-11: qty 0.1

## 2014-01-11 MED ORDER — CHLORHEXIDINE GLUCONATE CLOTH 2 % EX PADS: 6.0000 | MEDICATED_PAD | Freq: Once | CUTANEOUS | Status: DC

## 2014-01-11 MED ORDER — METOPROLOL TARTRATE 1 MG/ML IV SOLN: 2.0000 mg | INTRAVENOUS | Status: DC | PRN

## 2014-01-11 MED ORDER — PROPOFOL 10 MG/ML IV BOLUS
INTRAVENOUS | Status: AC
  Filled 2014-01-11: qty 20

## 2014-01-11 MED ORDER — METHOCARBAMOL 500 MG PO TABS
500.0000 mg | ORAL_TABLET | Freq: Four times a day (QID) | ORAL | Status: DC | PRN
  Filled 2014-01-11: qty 1

## 2014-01-11 MED ORDER — LABETALOL HCL 5 MG/ML IV SOLN
10.0000 mg | INTRAVENOUS | Status: DC | PRN
  Filled 2014-01-11: qty 4

## 2014-01-11 MED ORDER — SUCCINYLCHOLINE CHLORIDE 20 MG/ML IJ SOLN
INTRAMUSCULAR | Status: AC
  Filled 2014-01-11: qty 1

## 2014-01-11 MED ORDER — ALUM & MAG HYDROXIDE-SIMETH 200-200-20 MG/5ML PO SUSP: 15.0000 mL | ORAL | Status: DC | PRN

## 2014-01-11 MED ORDER — MIRTAZAPINE 15 MG PO TABS
15.0000 mg | ORAL_TABLET | Freq: Every day | ORAL | Status: DC
Start: 2014-01-11 — End: 2014-01-15
  Administered 2014-01-11 – 2014-01-12 (×2): 15 mg via ORAL
  Filled 2014-01-11 (×4): qty 1

## 2014-01-11 MED ORDER — LIDOCAINE HCL (CARDIAC) 20 MG/ML IV SOLN
INTRAVENOUS | Status: AC
  Filled 2014-01-11: qty 5

## 2014-01-11 MED ORDER — LOSARTAN POTASSIUM 50 MG PO TABS
100.0000 mg | ORAL_TABLET | Freq: Every day | ORAL | Status: DC
  Administered 2014-01-11 – 2014-01-14 (×3): 100 mg via ORAL
  Filled 2014-01-11 (×4): qty 2

## 2014-01-11 MED ORDER — ADULT MULTIVITAMIN W/MINERALS CH
1.0000 | ORAL_TABLET | Freq: Every day | ORAL | Status: DC
  Administered 2014-01-11 – 2014-01-14 (×3): 1 via ORAL
  Filled 2014-01-11 (×4): qty 1

## 2014-01-11 MED ORDER — GLUCERNA SHAKE PO LIQD
237.0000 mL | Freq: Every day | ORAL | Status: DC
  Administered 2014-01-11: 237 mL via ORAL

## 2014-01-11 MED ORDER — DOCUSATE SODIUM 100 MG PO CAPS
100.0000 mg | ORAL_CAPSULE | Freq: Two times a day (BID) | ORAL | Status: DC
  Administered 2014-01-11 – 2014-01-14 (×4): 100 mg via ORAL
  Filled 2014-01-11 (×8): qty 1

## 2014-01-11 MED ORDER — FENTANYL CITRATE 0.05 MG/ML IJ SOLN
INTRAMUSCULAR | Status: AC
  Filled 2014-01-11: qty 5

## 2014-01-11 MED ORDER — EPHEDRINE SULFATE 50 MG/ML IJ SOLN
INTRAMUSCULAR | Status: AC
  Filled 2014-01-11: qty 1

## 2014-01-11 MED ORDER — AMLODIPINE BESYLATE 5 MG PO TABS
5.0000 mg | ORAL_TABLET | Freq: Every day | ORAL | Status: DC
  Filled 2014-01-11: qty 1

## 2014-01-11 MED ORDER — HYDROCODONE-ACETAMINOPHEN 5-325 MG PO TABS
1.0000 | ORAL_TABLET | Freq: Four times a day (QID) | ORAL | Status: DC | PRN
  Administered 2014-01-11 – 2014-01-14 (×3): 1 via ORAL
  Filled 2014-01-11 (×2): qty 1

## 2014-01-11 MED ORDER — CALCIUM CARBONATE-VITAMIN D 500-200 MG-UNIT PO TABS
1.0000 | ORAL_TABLET | Freq: Two times a day (BID) | ORAL | Status: DC
  Administered 2014-01-11 – 2014-01-14 (×4): 1 via ORAL
  Filled 2014-01-11 (×8): qty 1

## 2014-01-11 MED ORDER — DONEPEZIL HCL 5 MG PO TABS
5.0000 mg | ORAL_TABLET | Freq: Every day | ORAL | Status: DC
  Administered 2014-01-11 – 2014-01-14 (×3): 5 mg via ORAL
  Filled 2014-01-11 (×4): qty 1

## 2014-01-11 MED ORDER — HYDROMORPHONE HCL 1 MG/ML IJ SOLN
0.2500 mg | INTRAMUSCULAR | Status: DC | PRN
  Administered 2014-01-12: 0.5 mg via INTRAVENOUS
  Filled 2014-01-11: qty 1

## 2014-01-11 MED ORDER — HYDRALAZINE HCL 20 MG/ML IJ SOLN: 5.0000 mg | INTRAMUSCULAR | Status: DC | PRN

## 2014-01-11 MED ORDER — LATANOPROST 0.005 % OP SOLN
1.0000 [drp] | Freq: Every day | OPHTHALMIC | Status: DC
  Administered 2014-01-11 – 2014-01-13 (×3): 1 [drp] via OPHTHALMIC
  Filled 2014-01-11: qty 2.5

## 2014-01-11 MED ORDER — SODIUM CHLORIDE 0.9 % IV SOLN
Freq: Once | INTRAVENOUS | Status: AC
  Administered 2014-01-11: 13:00:00 via INTRAVENOUS

## 2014-01-11 MED ORDER — SODIUM CHLORIDE 0.9 % IJ SOLN
INTRAMUSCULAR | Status: AC
  Filled 2014-01-11: qty 10

## 2014-01-11 MED ORDER — DILTIAZEM HCL ER COATED BEADS 180 MG PO CP24
180.0000 mg | ORAL_CAPSULE | Freq: Every day | ORAL | Status: DC
  Administered 2014-01-11 – 2014-01-14 (×3): 180 mg via ORAL
  Filled 2014-01-11 (×4): qty 1

## 2014-01-11 MED ORDER — IRBESARTAN 75 MG PO TABS: 75.0000 mg | ORAL_TABLET | Freq: Every day | ORAL | Status: DC

## 2014-01-11 MED ORDER — MIDAZOLAM HCL 2 MG/2ML IJ SOLN
INTRAMUSCULAR | Status: AC
  Filled 2014-01-11: qty 2

## 2014-01-11 SURGICAL SUPPLY — 49 items
BANDAGE ELASTIC 4 VELCRO ST LF (GAUZE/BANDAGES/DRESSINGS) ×4 IMPLANT
BANDAGE ELASTIC 6 VELCRO ST LF (GAUZE/BANDAGES/DRESSINGS) ×4 IMPLANT
BANDAGE ESMARK 6X9 LF (GAUZE/BANDAGES/DRESSINGS) ×2 IMPLANT
BLADE SAW SAG 29X58X.64 (BLADE) ×4 IMPLANT
BLADE SAW SGTL 81X20 HD (BLADE) ×3 IMPLANT
BNDG CMPR 9X6 STRL LF SNTH (GAUZE/BANDAGES/DRESSINGS) ×1
BNDG COHESIVE 6X5 TAN STRL LF (GAUZE/BANDAGES/DRESSINGS) ×4 IMPLANT
BNDG ESMARK 6X9 LF (GAUZE/BANDAGES/DRESSINGS) ×3
BNDG GAUZE ELAST 4 BULKY (GAUZE/BANDAGES/DRESSINGS) ×8 IMPLANT
CANISTER SUCTION 2500CC (MISCELLANEOUS) ×4 IMPLANT
CLIP TI MEDIUM 6 (CLIP) ×4 IMPLANT
COVER SURGICAL LIGHT HANDLE (MISCELLANEOUS) ×4 IMPLANT
COVER TABLE BACK 60X90 (DRAPES) ×4 IMPLANT
DRAIN CHANNEL 19F RND (DRAIN) IMPLANT
DRAPE ORTHO SPLIT 77X108 STRL (DRAPES) ×6
DRAPE PROXIMA HALF (DRAPES) ×4 IMPLANT
DRAPE SURG ORHT 6 SPLT 77X108 (DRAPES) ×4 IMPLANT
DRAPE U-SHAPE 47X51 STRL (DRAPES) ×4 IMPLANT
DRSG ADAPTIC 3X8 NADH LF (GAUZE/BANDAGES/DRESSINGS) ×4 IMPLANT
ELECT REM PT RETURN 9FT ADLT (ELECTROSURGICAL) ×3
ELECTRODE REM PT RTRN 9FT ADLT (ELECTROSURGICAL) ×2 IMPLANT
EVACUATOR SILICONE 100CC (DRAIN) IMPLANT
GAUZE SPONGE 4X4 12PLY STRL (GAUZE/BANDAGES/DRESSINGS) ×8 IMPLANT
GLOVE BIO SURGEON STRL SZ7 (GLOVE) ×4 IMPLANT
GLOVE BIOGEL PI IND STRL 6.5 (GLOVE) ×1 IMPLANT
GLOVE BIOGEL PI IND STRL 7.5 (GLOVE) ×2 IMPLANT
GLOVE BIOGEL PI INDICATOR 6.5 (GLOVE) ×2
GLOVE BIOGEL PI INDICATOR 7.5 (GLOVE) ×2
GLOVE ECLIPSE 6.5 STRL STRAW (GLOVE) ×6 IMPLANT
GOWN STRL REUS W/ TWL LRG LVL3 (GOWN DISPOSABLE) ×6 IMPLANT
GOWN STRL REUS W/TWL LRG LVL3 (GOWN DISPOSABLE) ×9
KIT BASIN OR (CUSTOM PROCEDURE TRAY) ×4 IMPLANT
KIT ROOM TURNOVER OR (KITS) ×4 IMPLANT
MICRO BLADE 41MM X 19.5MM ×3 IMPLANT
NS IRRIG 1000ML POUR BTL (IV SOLUTION) ×4 IMPLANT
PACK GENERAL/GYN (CUSTOM PROCEDURE TRAY) ×4 IMPLANT
PAD ARMBOARD 7.5X6 YLW CONV (MISCELLANEOUS) ×8 IMPLANT
STAPLER VISISTAT 35W (STAPLE) ×4 IMPLANT
STOCKINETTE IMPERVIOUS LG (DRAPES) ×4 IMPLANT
SUT ETHILON 3 0 PS 1 (SUTURE) IMPLANT
SUT SILK 0 TIES 10X30 (SUTURE) ×4 IMPLANT
SUT SILK 2 0 (SUTURE) ×3
SUT SILK 2-0 18XBRD TIE 12 (SUTURE) ×2 IMPLANT
SUT VIC AB 2-0 CT1 18 (SUTURE) ×8 IMPLANT
SUT VIC AB 3-0 SH 18 (SUTURE) IMPLANT
TOWEL OR 17X24 6PK STRL BLUE (TOWEL DISPOSABLE) ×4 IMPLANT
TOWEL OR 17X26 10 PK STRL BLUE (TOWEL DISPOSABLE) ×4 IMPLANT
UNDERPAD 30X30 INCONTINENT (UNDERPADS AND DIAPERS) ×4 IMPLANT
WATER STERILE IRR 1000ML POUR (IV SOLUTION) ×4 IMPLANT

## 2014-01-11 NOTE — Progress Notes (Signed)
  Echocardiogram 2D Echocardiogram has been performed. This was a technically difficult study.  Diamond Nickel 01/11/2014, 2:33 PM

## 2014-01-11 NOTE — Progress Notes (Signed)
   Daily Progress Note  Lab Results  Component Value Date   INR 2.05* 01/11/2014   INR 1.91* 10/04/2013   INR 1.38 10/03/2013   Due to elevated INR pt is not a candidate for spinal.  Will admit for Coumadin reversal and cardiology evaluation.  Will reschedule until tomorrow.  Adele Barthel, MD [facility] [place] Office: [phone] Pager: [phone]  01/11/2014, 7:40 AM

## 2014-01-11 NOTE — Progress Notes (Signed)
Pt refuses to allow me to put electrodes on for cardiac monitoring.

## 2014-01-11 NOTE — Care Management Note (Signed)
    Page 1 of 1   01/11/2014     2:15:42 PM CARE MANAGEMENT NOTE 01/11/2014  Patient:  [name], [name]   Account Number:  [id number]  Date Initiated:  01/11/2014  Documentation initiated by:  [facility]  Subjective/Objective Assessment:   80 yo male in with right foot ischemia.  Was scheduled for amputation, but canceled due to elevated INR.//From [facility] SNF.     Action/Plan:   Due to elevated INR pt is not a candidate for spinal.  Will admit for Coumadin reversal and cardiology evaluation. Will reschedule until tomorrow.//Assist with return to SNF   Anticipated DC Date:  01/15/2014   Anticipated DC Plan:  [place]  In-house referral  Clinical Social Worker      DC Planning Services  CM consult      Choice offered to / List presented to:             Status of service:  In process, will continue to follow Medicare Important Message given?   (If response is "NO", the following Medicare IM given date fields will be blank) Date Medicare IM given:   Medicare IM given by:   Date Additional Medicare IM given:   Additional Medicare IM given by:    Discharge Disposition:    Per UR Regulation:  Reviewed for med. necessity/level of care/duration of stay  If discussed at [place] of Stay Meetings, dates discussed:    Comments:  01/11/14 [place], RN, BSN, [place] [phone] Pt has dementia so much of his history is obtained from his son Cassidy Tabet) with him.  Onset of symptom occurred unknown period of months ago.  Pt current in [facility] SNF after R hip fracture and subsequent repair. Due to elevated INR (2.o5) pt is not a candidate for spinal.  Will admit for Coumadin reversal and cardiology evaluation.  Will reschedule until tomorrow(01/11/14).

## 2014-01-11 NOTE — Progress Notes (Signed)
Report called to [facility], Therapist, sports. Deniea further questions. Pt transport to 3E.

## 2014-01-11 NOTE — Progress Notes (Signed)
Patient is A/Ox1 only to self. He was a transfer from short stay. He was transferred to the unit by stretcher accompanied by his son, short stay RN, and short stay NT. During admission it was noted that patient had soiled his adult incontinence brief and short stay RN and NT helped with cleaning patient. It appeared to be dark red in color and appeared as if there was a small clot. Received phone call from blood bank about patient's plasma order. Called Dr.Chen and made him aware about patient's episode of dark red incontinence and to receive clarification on plasma order.

## 2014-01-11 NOTE — Interval H&P Note (Signed)
History and Physical Interval Note:  01/11/2014 7:16 AM  [name]  has presented today for surgery, with the diagnosis of Right leg ischemia with gangrene M62.89  The various methods of treatment have been discussed with the patient and family. After consideration of risks, benefits and other options for treatment, the patient has consented to  Procedure(s): AMPUTATION ABOVE KNEE (Right) as a surgical intervention .  The patient's history has been reviewed, patient examined, no change in status, stable for surgery.  I have reviewed the patient's chart and labs.  Questions were answered to the patient's satisfaction.     Debbora Ang LIANG-YU

## 2014-01-11 NOTE — Consult Note (Signed)
Name: [name] is a 80 y.o. male Admit date: 01/11/2014 Referring Physician:  Aaron Edelman L. Bridgett Larsson, M.D. Primary Physician:  Elsie Lincoln, M.D. Primary Cardiologist:  Osie Cheeks, M.D.  Reason for Consultation:  Preoperative cardiovascular assessment  ASSESSMENT: 1. Right leg ischemia, requiring analgesia and will likely require amputation, AKA 2. Sick sinus syndrome with history of atrial fibrillation and current permanent pacemaker 3. Rheumatic heart disease with severe mitral stenosis when last evaluated, 2013 4. Hypertension 5. Dementia 6. Atrial fibrillation, with poor rate control  PLAN: 1. Slow heart rate as tolerated to improve left atrial emptying and help maintain relatively low left atrial pressures and therefore avoid acute heart failure. 2, which this we will need to use either diltiazem instead of amlodipine, or add a beta blocker (which could theoretically cause reflex lower extremity vasoconstriction and compound leg ischemia). 2. In absence of cardiac complaints such as chest discomfort, I would not embark upon an ischemic evaluation. No matter the findings, this would not alter management. 3. Cleared for upcoming AKA of right lower extremity, in a patient who is felt to be at high risk for cardiovascular complications (greater than 5% possibility of acute complication such as heart failure, hypotension, tachycardia, MI, etc.). 4. Update echocardiogram to exclude possibility of an expected regional wall motion abnormality/systolic dysfunction.   HPI: 80 year old gentleman admitted to the hospital and excruciating right lower extremity pain. He has an ischemic right lower extremity leg. He is being managed by Dr. Bridgett Larsson. The patient is very restless in bed. He is unable to assess her questions and a coherent manner. His son is present. The main concern is the severe right lower extremity pain. He denies dyspnea and chest pain. According to the son and patient they did  not admit to any known history of heart disease other than pacemaker therapy for sick sinus syndrome.. The patient has been followed in the past by Dr. Jacqulyn Ducking with major cardiac issues being sick sinus syndrome and permanent pacemaker. He currently denies orthopnea, PND, and chest pain. There is no lower extremity swelling.  PMH:   Past Medical History  Diagnosis Date  . Rheumatic heart disease     moderate mitral stenosis and history of CHF  . Hypertension   . Sick sinus syndrome      Paroxysmal and then constant AF; slow rate on modest AV nodal blocking agents  . Chronic low back pain   . Atrial fibrillation     Per record has Afib/Aflutter with SSS  . PVD (peripheral vascular disease)     Right superficial femoral to peroneal artery bypass graft with nonreversed saphenous vein composite  2002  . Mesenteric ischemia     SMA artery embolectomy in 2002  . Dementia     mild/H&P (12/13/2011)  . Prostate cancer   . Shortness of breath     "@ night" (12/13/2011)  . Pacemaker   . Pacemaker     PSH:   Past Surgical History  Procedure Laterality Date  . Transurethral resection of prostate    . Embolectomy  2002    SMA artery  . Femoral-peroneal bypass graft  2002    right superficial (12/13/2011)  . Orif hip fracture Right 09/30/2013    Procedure: OPEN TREATMENT INTERNAL FIXATION OF RIGHT HIP FRACTURE;  Surgeon: Sanjuana Kava, MD;  Location: [facility];  Service: Orthopedics;  Laterality: Right;   Allergies:  Review of patient's allergies indicates no known allergies. Prior to Admit Meds:   Prescriptions  prior to admission  Medication Sig Dispense Refill  . acetaminophen (TYLENOL) 650 MG CR tablet Take 650 mg by mouth every 6 (six) hours as needed for pain.      Marland Kitchen amLODipine (NORVASC) 5 MG tablet Take 1 tablet (5 mg total) by mouth daily.  90 tablet  3  . calcium-vitamin D (OSCAL WITH D) 500-200 MG-UNIT per tablet Take 1 tablet by mouth 2 (two) times daily.      Marland Kitchen docusate sodium  (COLACE) 100 MG capsule Take 100 mg by mouth 2 (two) times daily.      Marland Kitchen donepezil (ARICEPT) 5 MG tablet Take 5 mg by mouth daily.      . feeding supplement, GLUCERNA SHAKE, (GLUCERNA SHAKE) LIQD Take 237 mLs by mouth at bedtime.    0  . HYDROcodone-acetaminophen (NORCO/VICODIN) 5-325 MG per tablet Take 1-2 tablets by mouth every 6 (six) hours as needed for moderate pain.  30 tablet  0  . losartan (COZAAR) 100 MG tablet Take 100 mg by mouth daily.      . methocarbamol (ROBAXIN) 500 MG tablet Take 1 tablet (500 mg total) by mouth every 6 (six) hours as needed for muscle spasms.  20 tablet  0  . mirtazapine (REMERON) 15 MG tablet Take 15 mg by mouth at bedtime.      . Multiple Vitamin (MULTIVITAMIN WITH MINERALS) TABS tablet Take 1 tablet by mouth daily.      . traMADol (ULTRAM) 50 MG tablet Take 50 mg by mouth every 6 (six) hours as needed (pain).       . TRAVATAN Z 0.004 % SOLN ophthalmic solution Place 1 drop into both eyes at bedtime.       Marland Kitchen warfarin (COUMADIN) 5 MG tablet Take 5 mg by mouth daily. Take 1 tablet (5mg ) daily except 1/2 tablet (2.5mg  ) on Mon & Thurs      . olmesartan (BENICAR) 40 MG tablet Take 1 tablet (40 mg total) by mouth daily.  30 tablet  6   Fam HX:   History reviewed. No pertinent family history. Social HX:    History   Social History  . Marital Status: Widowed    Spouse Name: N/A    Number of Children: N/A  . Years of Education: N/A   Occupational History  . retired     yard work Customer service manager   Social History Main Topics  . Smoking status: Never Smoker   . Smokeless tobacco: Never Used  . Alcohol Use: No  . Drug Use: No  . Sexual Activity: No   Other Topics Concern  . Not on file   Social History Narrative  . No narrative on file     Review of Systems: Unreliable other than obvious right lower extremity pain  Physical Exam: Blood pressure 160/78, pulse 77, temperature 97.6 F (36.4 C), temperature source Oral, resp. rate 18, height 5\' 2"   (1.575 m), weight 111 lb (50.349 kg), SpO2 100.00%. Weight change:   Frail, restless, thrashing about in bed. Difficulty answering questions. Not acutely dyspneic. No JVD is noted. Cardiac exam reveals an irregular rhythm. Rate is faster than one would like given the history of mitral stenosis Clear lung fields bilaterally Abdomen soft. Right lower extremity is cool and tender to tightness. Has no peripheral edema Neurological exam is abnormal with obvious memory deficit which compounds ability to communicate any appropriate information Labs: Lab Results  Component Value Date   WBC 5.4 01/11/2014   HGB 11.9* 01/11/2014  HCT 37.7* 01/11/2014   MCV 87.5 01/11/2014   PLT 222 01/11/2014    Recent Labs Lab 01/11/14 0646  NA 145  K 4.3  CL 107  CO2 25  BUN 26*  CREATININE 0.95  CALCIUM 9.4  PROT 7.2  BILITOT 0.4  ALKPHOS 92  ALT 14  AST 24  GLUCOSE 98   No results found for this basename: PTT   Lab Results  Component Value Date   INR 2.05* 01/11/2014   INR 1.91* 10/04/2013   INR 1.38 10/03/2013   Lab Results  Component Value Date   CKTOTAL 90 07/01/2008   CKMB 1.9 07/01/2008   TROPONINI <0.30 12/13/2011     Lab Results  Component Value Date   CHOL 219* 09/22/2009   CHOL 219 09/22/2009   Lab Results  Component Value Date   HDL 56 09/22/2009   HDL 56 09/22/2009   Lab Results  Component Value Date   LDLCALC 142* 09/22/2009   LDLCALC 142 09/22/2009   Lab Results  Component Value Date   TRIG 104 09/22/2009   TRIG 104 09/22/2009   Lab Results  Component Value Date   CHOLHDL 3.9 Ratio 09/22/2009   No results found for this basename: LDLDIRECT      Radiology:  No results found.  EKG:  Atrial fibrillation with the paced rhythm (July/2015  ECHOCARDIOGRAM: 12/14/11 Study Conclusions  - Left ventricle: The cavity size was normal. Wall thickness was normal. Systolic function was normal. The estimated ejection fraction was in the range of 55% to 60%. Wall motion was  normal; there were no regional wall motion abnormalities. - Aortic valve: Mild regurgitation. - Mitral valve: Calcified annulus. Mild thickening, consistent with rheumatic disease. The findings are consistent with severe stenosis. Mild regurgitation. Valve area by pressure half-time: 1.02cm^2. - Left atrium: The atrium was severely dilated. - Right ventricle: The cavity size was mildly dilated. - Right atrium: The atrium was severely dilated. - Pulmonary arteries: Systolic pressure was moderately to severely increased. PA peak pressure: 71mm Hg (S). - Pericardium, extracardiac: A trivial pericardial effusion was identified.   Sinclair Grooms 01/11/2014 11:43 AM

## 2014-01-11 NOTE — H&P (View-Only) (Signed)
Referred by:  Elsie Lincoln, MD [address] [place], [place] [address]  Reason for referral: right foot ischemia  History of Present Illness  [name] is a 80 y.o. (06-02-1924) male s/p prior SMA thrombectomy with Dr. Scot Dock who presents with chief complaint: constant pain in R foot.  Pt has dementia so much of his history is obtained from his son with him.  Onset of symptom occurred unknown period of months ago.  Pt current in SNF after R hip fracture and subsequent repair.  Pain is described as sharp, severity 6-10/10, and associated with any movement of R foot.  Patient has attempted to treat this pain with Tramadol.  The patient has rest pain symptoms also and Right foot ulcers.  Atherosclerotic risk factors include: HTN.  Past Medical History  Diagnosis Date  . Rheumatic heart disease     moderate mitral stenosis and history of CHF  . Hypertension   . Sick sinus syndrome      Paroxysmal and then constant AF; slow rate on modest AV nodal blocking agents  . Chronic low back pain   . Atrial fibrillation     Per record has Afib/Aflutter with SSS  . PVD (peripheral vascular disease)     Right superficial femoral to peroneal artery bypass graft with nonreversed saphenous vein composite  2002  . Mesenteric ischemia     SMA artery embolectomy in 2002  . Dementia     mild/H&P (12/13/2011)  . Prostate cancer   . Shortness of breath     "@ night" (12/13/2011)  . Pacemaker     Past Surgical History  Procedure Laterality Date  . Transurethral resection of prostate    . Embolectomy  2002    SMA artery  . Femoral-peroneal bypass graft  2002    right superficial (12/13/2011)  . Orif hip fracture Right 09/30/2013    Procedure: OPEN TREATMENT INTERNAL FIXATION OF RIGHT HIP FRACTURE;  Surgeon: Sanjuana Kava, MD;  Location: [facility];  Service: Orthopedics;  Laterality: Right;    History   Social History  . Marital Status: Widowed    Spouse Name: N/A    Number of  Children: N/A  . Years of Education: N/A   Occupational History  . retired     yard work Customer service manager   Social History Main Topics  . Smoking status: Never Smoker   . Smokeless tobacco: Never Used  . Alcohol Use: No  . Drug Use: No  . Sexual Activity: No   Other Topics Concern  . Not on file   Social History Narrative  . No narrative on file    Family History: cannot be obtained due to patient's dementia  Current Outpatient Prescriptions on File Prior to Visit  Medication Sig Dispense Refill  . amLODipine (NORVASC) 5 MG tablet Take 1 tablet (5 mg total) by mouth daily.  90 tablet  3  . donepezil (ARICEPT) 5 MG tablet Take 5 mg by mouth daily.      Marland Kitchen HYDROcodone-acetaminophen (NORCO/VICODIN) 5-325 MG per tablet Take 1-2 tablets by mouth every 6 (six) hours as needed for moderate pain.  30 tablet  0  . methocarbamol (ROBAXIN) 500 MG tablet Take 1 tablet (500 mg total) by mouth every 6 (six) hours as needed for muscle spasms.  20 tablet  0  . TRAVATAN Z 0.004 % SOLN ophthalmic solution Place 1 drop into both eyes at bedtime.       Marland Kitchen warfarin (COUMADIN) 5 MG  tablet Take 2.5-5 mg by mouth daily. Take 1 tablet (5mg ) daily except 1/2 tablet (2.5mg  ) on Mon & Thurs      . enoxaparin (LOVENOX) 40 MG/0.4ML injection Inject 0.4 mLs (40 mg total) into the skin daily.  0 Syringe    . feeding supplement, GLUCERNA SHAKE, (GLUCERNA SHAKE) LIQD Take 237 mLs by mouth at bedtime.    0  . iron polysaccharides (NIFEREX) 150 MG capsule Take 150 mg by mouth daily.      Marland Kitchen olmesartan (BENICAR) 40 MG tablet Take 1 tablet (40 mg total) by mouth daily.  30 tablet  6  . olmesartan (BENICAR) 40 MG tablet Take 40 mg by mouth daily.       No current facility-administered medications on file prior to visit.    No Known Allergies  REVIEW OF SYSTEMS:  (Positives checked otherwise negative)  CARDIOVASCULAR:  []  chest pain, []  chest pressure, []  palpitations, []  shortness of breath when laying flat, []   shortness of breath with exertion,  [x]  pain in feet when walking, [x]  pain in feet when laying flat, []  history of blood clot in veins (DVT), []  history of phlebitis, []  swelling in legs, []  varicose veins, [x]  cardiac arrhythmia   PULMONARY:  []  productive cough, []  asthma, []  wheezing  NEUROLOGIC:  []  weakness in arms or legs, []  numbness in arms or legs, []  difficulty speaking or slurred speech, []  temporary loss of vision in one eye, []  dizziness  HEMATOLOGIC:  []  bleeding problems, []  problems with blood clotting too easily  MUSCULOSKEL:  [x]  joint pain, []  joint swelling  GASTROINTEST:  []  vomiting blood, []  blood in stool     GENITOURINARY:  []  burning with urination, []  blood in urine  PSYCHIATRIC:  []  history of major depression  INTEGUMENTARY:  []  rashes, [x]  ulcers  CONSTITUTIONAL:  []  fever, []  chills  For VQI Use Only  PRE-ADM LIVING: Nursing home  AMB STATUS: Wheelchair  CAD Sx: None  PRIOR CHF: None  STRESS TEST: [x]  No, [ ]  Normal, [ ]  + ischemia, [ ]  + MI, [ ]  Both   Physical Examination  Filed Vitals:   01/07/14 0922  BP: 133/80  Pulse: 71  Temp: 97 F (36.1 C)  TempSrc: Oral  Height: 5\' 2"  (1.575 m)  Weight: 103 lb (46.72 kg)  SpO2: 100%   Body mass index is 18.83 kg/(m^2).  General: alert, obviously demented, WD, elderly  Head: [place]/AT, Temporalis wasting,   Ear/Nose/Throat: Hearing grossly intact, nares w/o erythema or drainage, oropharynx cannot be examined as pt can't cooperate  Eyes:  Unable to cooperate with exam due to dementia  Neck: Supple, no nuchal rigidity, no palpable LAD, rotated on left tonically  Pulmonary: Sym exp, good air movt, CTAB, no rales, rhonchi, & wheezing  Cardiac: RRR, Nl S1, S2, no Murmurs, rubs or gallops  Vascular: Vessel Right Left  Radial Faintly Palpable Faintly Palpable  Brachial Not Palpable Not Palpable  Carotid Palpable, without bruit Cannot exam due to neck position  Aorta Not palpable N/A    Femoral Palpable Palpable  Popliteal Not palpable Not palpable  PT Not Palpable Not Palpable  DP Not Palpable Not Palpable   Gastrointestinal: soft, NTND, -G/R, - HSM, - masses, - CVAT B, prior incision healed  Musculoskeletal: M/S exam not possible due to inability to cooperate, does spontaneous move L arm and moves L leg to command, with severe pain can extend R leg but contracture evident in knee, R foot cool with  blistering in toes, ulcers over anterior ankle and smaller ulcer over lateral surface of foot, no drainage, ischemic skin extends to mid-calf  Neurologic: cannot obtain due to inability to cooperate due dementia  Psychiatric: cannot obtain due to inability to cooperate due dementia  Dermatologic: See M/S exam for extremity exam, no rashes otherwise noted  Lymph : No Cervical, Axillary, or Inguinal lymphadenopathy    Outside Studies/Documentation 8 pages of outside documents were reviewed including: Nursing home record and tracing of BLE pedal signals Medical Decision Making  FULLER MAKIN is a 80 y.o. male who presents with: R leg ischemia with gangrene (suspect R foot in the process of dying), LLE PAD   Realistically, this patient options are: 1.  Palliative management narcotics, abx, and wound care 2.  Palliative R AKA (no point to BKA given contracture)   Given the severity of this patient's sx, I doubt #1 will be a viable option for long. I discussed in depth the nature of above-the-knee amputation with the patient, including risks, benefits, and alternatives.   The patient and family is aware that the risks of above-the-knee amputation include but are not limited to: bleeding, infection, myocardial infarction, stroke, death, failure to heal amputation wound, and possible need for more proximal amputation.   The patient and family is aware of the risks.  He is tenatively scheduled for this coming Tuesday but the family is going to hold a family meeting to make a  final decision over the weekend.   Adele Barthel, MD [facility] Office: [phone] Pager: [phone]  01/07/2014, 9:56 AM

## 2014-01-11 NOTE — Progress Notes (Signed)
Pt is incontinent of bowel and bladder, noticed disposable pads soaked with urine mixed with blood, perineal hygiene done. Dr. Scot Dock was notified and ordered for UA and CBC in AM. Will continue to monitor pt.

## 2014-01-11 NOTE — Anesthesia Preprocedure Evaluation (Addendum)
Anesthesia Evaluation  Patient identified by MRN, date of birth, ID band Patient confused    Reviewed: Allergy & Precautions, H&P , NPO status , Patient's Chart, lab work & pertinent test results  Airway Mallampati: I  TM Distance: >3 FB Neck ROM: full    Dental no notable dental hx.    Pulmonary neg pulmonary ROS,  breath sounds clear to auscultation  Pulmonary exam normal       Cardiovascular hypertension, On Medications + Peripheral Vascular Disease + dysrhythmias Atrial Fibrillation + pacemaker + Valvular Problems/Murmurs Rhythm:regular Rate:Normal  Severe mitral stenosis Sick sinus syndrome   Neuro/Psych Dementia negative psych ROS   GI/Hepatic negative GI ROS, Neg liver ROS,   Endo/Other  negative endocrine ROS  Renal/GU negative Renal ROS  negative genitourinary   Musculoskeletal   Abdominal   Peds  Hematology negative hematology ROS (+)   Anesthesia Other Findings   Reproductive/Obstetrics negative OB ROS                           Anesthesia Physical Anesthesia Plan  ASA: IV  Anesthesia Plan: Spinal   Post-op Pain Management:    Induction: Intravenous  Airway Management Planned: Simple Face Mask  Additional Equipment:   Intra-op Plan:   Post-operative Plan:   Informed Consent: I have reviewed the patients History and Physical, chart, labs and discussed the procedure including the risks, benefits and alternatives for the proposed anesthesia with the patient or authorized representative who has indicated his/her understanding and acceptance.   Dental advisory given  Plan Discussed with: CRNA  Anesthesia Plan Comments: (Case cancelled. Pt needs INR to come down as well as a cardiology consult.)       Anesthesia Quick Evaluation

## 2014-01-12 ENCOUNTER — Encounter (HOSPITAL_COMMUNITY)
Admission: RE | Disposition: A | Discharge status: Skilled Nursing Facility | Payer: Self-pay | Source: Ambulatory Visit | Attending: Vascular Surgery

## 2014-01-12 ENCOUNTER — Encounter (HOSPITAL_COMMUNITY): Payer: Self-pay | Admitting: Certified Registered Nurse Anesthetist

## 2014-01-12 ENCOUNTER — Encounter (HOSPITAL_COMMUNITY): Payer: Medicare HMO | Admitting: Certified Registered Nurse Anesthetist

## 2014-01-12 ENCOUNTER — Inpatient Hospital Stay (HOSPITAL_COMMUNITY): Payer: Medicare HMO | Admitting: Certified Registered Nurse Anesthetist

## 2014-01-12 DIAGNOSIS — Z950 Presence of cardiac pacemaker: Secondary | ICD-10-CM

## 2014-01-12 DIAGNOSIS — I70261 Atherosclerosis of native arteries of extremities with gangrene, right leg: Secondary | ICD-10-CM

## 2014-01-12 HISTORY — PX: AMPUTATION: SHX166

## 2014-01-12 LAB — PROTIME-INR
INR: 1.31 (ref 0.00–1.49)
Prothrombin Time: 16.5 seconds — ABNORMAL HIGH (ref 11.6–15.2)

## 2014-01-12 LAB — URINALYSIS, ROUTINE W REFLEX MICROSCOPIC
BILIRUBIN URINE: NEGATIVE
Glucose, UA: NEGATIVE mg/dL
Ketones, ur: 15 mg/dL — AB
Nitrite: NEGATIVE
PROTEIN: 100 mg/dL — AB
SPECIFIC GRAVITY, URINE: 1.018 (ref 1.005–1.030)
Urobilinogen, UA: 0.2 mg/dL (ref 0.0–1.0)
pH: 8 (ref 5.0–8.0)

## 2014-01-12 LAB — BASIC METABOLIC PANEL
Anion gap: 12 (ref 5–15)
BUN: 20 mg/dL (ref 6–23)
CO2: 25 meq/L (ref 19–32)
CREATININE: 0.9 mg/dL (ref 0.50–1.35)
Calcium: 9.5 mg/dL (ref 8.4–10.5)
Chloride: 106 mEq/L (ref 96–112)
GFR calc non Af Amer: 74 mL/min — ABNORMAL LOW (ref >90)
GFR, EST AFRICAN AMERICAN: 86 mL/min — AB (ref >90)
Glucose, Bld: 91 mg/dL (ref 70–99)
POTASSIUM: 4.4 meq/L (ref 3.7–5.3)
Sodium: 143 mEq/L (ref 137–147)

## 2014-01-12 LAB — URINE MICROSCOPIC-ADD ON

## 2014-01-12 LAB — CBC
HEMATOCRIT: 34.3 % — AB (ref 39.0–52.0)
HEMATOCRIT: 36.9 % — AB (ref 39.0–52.0)
HEMOGLOBIN: 11.2 g/dL — AB (ref 13.0–17.0)
HEMOGLOBIN: 11.9 g/dL — AB (ref 13.0–17.0)
MCH: 28.1 pg (ref 26.0–34.0)
MCH: 28 pg (ref 26.0–34.0)
MCHC: 32.7 g/dL (ref 30.0–36.0)
MCHC: 32.2 g/dL (ref 30.0–36.0)
MCV: 86.2 fL (ref 78.0–100.0)
MCV: 86.8 fL (ref 78.0–100.0)
PLATELETS: 188 10*3/uL (ref 150–400)
Platelets: 217 10*3/uL (ref 150–400)
RBC: 3.98 MIL/uL — AB (ref 4.22–5.81)
RBC: 4.25 MIL/uL (ref 4.22–5.81)
RDW: 14.3 % (ref 11.5–15.5)
RDW: 14.4 % (ref 11.5–15.5)
WBC: 5.5 10*3/uL (ref 4.0–10.5)
WBC: 6.9 10*3/uL (ref 4.0–10.5)

## 2014-01-12 LAB — PREPARE FRESH FROZEN PLASMA
UNIT DIVISION: 0
Unit division: 0

## 2014-01-12 LAB — SURGICAL PCR SCREEN
MRSA, PCR: NEGATIVE
STAPHYLOCOCCUS AUREUS: NEGATIVE

## 2014-01-12 LAB — CREATININE, SERUM
Creatinine, Ser: 0.95 mg/dL (ref 0.50–1.35)
GFR calc Af Amer: 84 mL/min — ABNORMAL LOW (ref >90)
GFR calc non Af Amer: 73 mL/min — ABNORMAL LOW (ref >90)

## 2014-01-12 LAB — GLUCOSE, CAPILLARY: Glucose-Capillary: 75 mg/dL (ref 70–99)

## 2014-01-12 SURGERY — AMPUTATION, ABOVE KNEE
Anesthesia: Spinal | Site: Leg Upper | Laterality: Right

## 2014-01-12 MED ORDER — FENTANYL CITRATE 0.05 MG/ML IJ SOLN
INTRAMUSCULAR | Status: AC
  Filled 2014-01-12: qty 5

## 2014-01-12 MED ORDER — GUAIFENESIN-DM 100-10 MG/5ML PO SYRP: 15.0000 mL | ORAL_SOLUTION | ORAL | Status: DC | PRN

## 2014-01-12 MED ORDER — STERILE WATER FOR INJECTION IJ SOLN
INTRAMUSCULAR | Status: AC
  Filled 2014-01-12: qty 10

## 2014-01-12 MED ORDER — EPHEDRINE SULFATE 50 MG/ML IJ SOLN
INTRAMUSCULAR | Status: AC
  Filled 2014-01-12: qty 1

## 2014-01-12 MED ORDER — ACETAMINOPHEN 325 MG PO TABS: 325.0000 mg | ORAL_TABLET | ORAL | Status: DC | PRN

## 2014-01-12 MED ORDER — ARTIFICIAL TEARS OP OINT
TOPICAL_OINTMENT | OPHTHALMIC | Status: AC
  Filled 2014-01-12: qty 3.5

## 2014-01-12 MED ORDER — HEPARIN SODIUM (PORCINE) 5000 UNIT/ML IJ SOLN
5000.0000 [IU] | Freq: Three times a day (TID) | INTRAMUSCULAR | Status: DC
  Administered 2014-01-12 – 2014-01-14 (×4): 5000 [IU] via SUBCUTANEOUS
  Filled 2014-01-12 (×6): qty 1

## 2014-01-12 MED ORDER — POLYETHYLENE GLYCOL 3350 17 G PO PACK
17.0000 g | PACK | Freq: Every day | ORAL | Status: DC | PRN
  Filled 2014-01-12: qty 1

## 2014-01-12 MED ORDER — PHENOL 1.4 % MT LIQD
1.0000 | OROMUCOSAL | Status: DC | PRN
  Filled 2014-01-12: qty 177

## 2014-01-12 MED ORDER — OXYCODONE HCL 5 MG PO TABS
5.0000 mg | ORAL_TABLET | ORAL | Status: DC | PRN
  Administered 2014-01-14: 10 mg via ORAL
  Filled 2014-01-12: qty 1
  Filled 2014-01-12: qty 2

## 2014-01-12 MED ORDER — ONDANSETRON HCL 4 MG/2ML IJ SOLN
INTRAMUSCULAR | Status: AC
  Filled 2014-01-12: qty 2

## 2014-01-12 MED ORDER — LIDOCAINE-EPINEPHRINE (PF) 1 %-1:200000 IJ SOLN
INTRAMUSCULAR | Status: AC
  Filled 2014-01-12: qty 10

## 2014-01-12 MED ORDER — LACTATED RINGERS IV SOLN
INTRAVENOUS | Status: DC
  Administered 2014-01-12: 11:00:00 via INTRAVENOUS

## 2014-01-12 MED ORDER — FENTANYL CITRATE 0.05 MG/ML IJ SOLN
INTRAMUSCULAR | Status: AC
  Administered 2014-01-12 (×3): 12.5 ug
  Filled 2014-01-12: qty 2

## 2014-01-12 MED ORDER — BISACODYL 10 MG RE SUPP: 10.0000 mg | Freq: Every day | RECTAL | Status: DC | PRN

## 2014-01-12 MED ORDER — LIDOCAINE HCL (CARDIAC) 20 MG/ML IV SOLN
INTRAVENOUS | Status: AC
  Filled 2014-01-12: qty 5

## 2014-01-12 MED ORDER — FENTANYL CITRATE 0.05 MG/ML IJ SOLN
INTRAMUSCULAR | Status: DC | PRN
  Administered 2014-01-12 (×2): 25 ug via INTRAVENOUS
  Administered 2014-01-12: 50 ug via INTRAVENOUS

## 2014-01-12 MED ORDER — DEXTROSE 5 % IV SOLN
1.5000 g | Freq: Two times a day (BID) | INTRAVENOUS | Status: AC
  Administered 2014-01-12 – 2014-01-13 (×2): 1.5 g via INTRAVENOUS
  Filled 2014-01-12 (×2): qty 1.5

## 2014-01-12 MED ORDER — LIDOCAINE HCL (CARDIAC) 20 MG/ML IV SOLN
INTRAVENOUS | Status: DC | PRN
  Administered 2014-01-12: 50 mg via INTRAVENOUS

## 2014-01-12 MED ORDER — SODIUM CHLORIDE 0.9 % IV SOLN
INTRAVENOUS | Status: DC
  Administered 2014-01-12: 19:00:00 via INTRAVENOUS

## 2014-01-12 MED ORDER — ONDANSETRON HCL 4 MG/2ML IJ SOLN
INTRAMUSCULAR | Status: AC
  Filled 2014-01-12: qty 4

## 2014-01-12 MED ORDER — CEFAZOLIN SODIUM-DEXTROSE 2-3 GM-% IV SOLR
INTRAVENOUS | Status: DC | PRN
  Administered 2014-01-12: 2 g via INTRAVENOUS

## 2014-01-12 MED ORDER — LACTATED RINGERS IV SOLN
INTRAVENOUS | Status: DC | PRN
  Administered 2014-01-12: 15:00:00 via INTRAVENOUS

## 2014-01-12 MED ORDER — ACETAMINOPHEN 650 MG RE SUPP
325.0000 mg | RECTAL | Status: DC | PRN
  Administered 2014-01-13: 650 mg via RECTAL
  Filled 2014-01-12: qty 1

## 2014-01-12 MED ORDER — 0.9 % SODIUM CHLORIDE (POUR BTL) OPTIME
TOPICAL | Status: DC | PRN
  Administered 2014-01-12: 1000 mL

## 2014-01-12 MED ORDER — FLEET ENEMA 7-19 GM/118ML RE ENEM
1.0000 | ENEMA | Freq: Once | RECTAL | Status: AC | PRN
  Filled 2014-01-12: qty 1

## 2014-01-12 MED ORDER — PHENYLEPHRINE HCL 10 MG/ML IJ SOLN
10.0000 mg | INTRAMUSCULAR | Status: DC | PRN
  Administered 2014-01-12: 25 ug/min via INTRAVENOUS

## 2014-01-12 MED ORDER — SUCCINYLCHOLINE CHLORIDE 20 MG/ML IJ SOLN
INTRAMUSCULAR | Status: DC | PRN
  Administered 2014-01-12: 100 mg via INTRAVENOUS

## 2014-01-12 MED ORDER — MORPHINE SULFATE 2 MG/ML IJ SOLN
2.0000 mg | INTRAMUSCULAR | Status: DC | PRN
  Administered 2014-01-13 (×2): 2 mg via INTRAVENOUS
  Filled 2014-01-12 (×2): qty 1

## 2014-01-12 MED ORDER — PROPOFOL 10 MG/ML IV BOLUS
INTRAVENOUS | Status: DC | PRN
  Administered 2014-01-12: 100 mg via INTRAVENOUS

## 2014-01-12 SURGICAL SUPPLY — 49 items
BANDAGE ELASTIC 4 VELCRO ST LF (GAUZE/BANDAGES/DRESSINGS) ×3 IMPLANT
BANDAGE ELASTIC 6 VELCRO ST LF (GAUZE/BANDAGES/DRESSINGS) ×3 IMPLANT
BANDAGE ESMARK 6X9 LF (GAUZE/BANDAGES/DRESSINGS) ×1 IMPLANT
BLADE SAW SAG 29X58X.64 (BLADE) ×3 IMPLANT
BNDG CMPR 9X6 STRL LF SNTH (GAUZE/BANDAGES/DRESSINGS) ×1
BNDG COHESIVE 6X5 TAN STRL LF (GAUZE/BANDAGES/DRESSINGS) ×3 IMPLANT
BNDG ESMARK 6X9 LF (GAUZE/BANDAGES/DRESSINGS) ×3
BNDG GAUZE ELAST 4 BULKY (GAUZE/BANDAGES/DRESSINGS) ×4 IMPLANT
CANISTER SUCTION 2500CC (MISCELLANEOUS) ×3 IMPLANT
CLIP TI MEDIUM 6 (CLIP) ×3 IMPLANT
COVER SURGICAL LIGHT HANDLE (MISCELLANEOUS) ×3 IMPLANT
COVER TABLE BACK 60X90 (DRAPES) ×3 IMPLANT
CUFF TOURNIQUET SINGLE 34IN LL (TOURNIQUET CUFF) ×2 IMPLANT
DRAIN CHANNEL 19F RND (DRAIN) IMPLANT
DRAPE ORTHO SPLIT 77X108 STRL (DRAPES) ×6
DRAPE PROXIMA HALF (DRAPES) ×3 IMPLANT
DRAPE SURG ORHT 6 SPLT 77X108 (DRAPES) ×2 IMPLANT
DRAPE U-SHAPE 47X51 STRL (DRAPES) ×3 IMPLANT
DRSG ADAPTIC 3X8 NADH LF (GAUZE/BANDAGES/DRESSINGS) ×3 IMPLANT
ELECT REM PT RETURN 9FT ADLT (ELECTROSURGICAL) ×3
ELECTRODE REM PT RTRN 9FT ADLT (ELECTROSURGICAL) ×1 IMPLANT
EVACUATOR SILICONE 100CC (DRAIN) IMPLANT
GAUZE SPONGE 4X4 12PLY STRL (GAUZE/BANDAGES/DRESSINGS) ×6 IMPLANT
GLOVE BIO SURGEON STRL SZ7 (GLOVE) ×3 IMPLANT
GLOVE BIOGEL M 6.5 STRL (GLOVE) ×4 IMPLANT
GLOVE BIOGEL PI IND STRL 6.5 (GLOVE) IMPLANT
GLOVE BIOGEL PI IND STRL 7.5 (GLOVE) ×1 IMPLANT
GLOVE BIOGEL PI INDICATOR 6.5 (GLOVE) ×6
GLOVE BIOGEL PI INDICATOR 7.5 (GLOVE) ×2
GOWN STRL REUS W/ TWL LRG LVL3 (GOWN DISPOSABLE) ×3 IMPLANT
GOWN STRL REUS W/TWL LRG LVL3 (GOWN DISPOSABLE) ×9
KIT BASIN OR (CUSTOM PROCEDURE TRAY) ×3 IMPLANT
KIT ROOM TURNOVER OR (KITS) ×3 IMPLANT
NS IRRIG 1000ML POUR BTL (IV SOLUTION) ×3 IMPLANT
PACK GENERAL/GYN (CUSTOM PROCEDURE TRAY) ×3 IMPLANT
PAD ARMBOARD 7.5X6 YLW CONV (MISCELLANEOUS) ×6 IMPLANT
SPONGE GAUZE 4X4 12PLY STER LF (GAUZE/BANDAGES/DRESSINGS) ×2 IMPLANT
STAPLER VISISTAT 35W (STAPLE) ×3 IMPLANT
STOCKINETTE IMPERVIOUS LG (DRAPES) ×3 IMPLANT
SUT ETHILON 3 0 PS 1 (SUTURE) IMPLANT
SUT SILK 0 TIES 10X30 (SUTURE) ×3 IMPLANT
SUT SILK 2 0 (SUTURE) ×3
SUT SILK 2-0 18XBRD TIE 12 (SUTURE) ×1 IMPLANT
SUT VIC AB 2-0 CT1 18 (SUTURE) ×6 IMPLANT
SUT VIC AB 3-0 SH 18 (SUTURE) IMPLANT
TOWEL OR 17X24 6PK STRL BLUE (TOWEL DISPOSABLE) ×3 IMPLANT
TOWEL OR 17X26 10 PK STRL BLUE (TOWEL DISPOSABLE) ×3 IMPLANT
UNDERPAD 30X30 INCONTINENT (UNDERPADS AND DIAPERS) ×3 IMPLANT
WATER STERILE IRR 1000ML POUR (IV SOLUTION) ×3 IMPLANT

## 2014-01-12 NOTE — Progress Notes (Signed)
SUBJECTIVE:  Very sleepy  OBJECTIVE:   Vitals:   Filed Vitals:   01/11/14 2302 01/12/14 0132 01/12/14 0503 01/12/14 0927  BP: 141/60 151/82 147/72 123/58  Pulse: 69 98 66 74  Temp: 98 F (36.7 C) 97.8 F (36.6 C) 98.5 F (36.9 C) 98.1 F (36.7 C)  TempSrc: Oral Oral Oral Oral  Resp: 18 20 18 18   Height:      Weight:   108 lb 1.6 oz (49.034 kg)   SpO2: 100% 100% 100% 100%   I&O's:   Intake/Output Summary (Last 24 hours) at 01/12/14 0929 Last data filed at 01/12/14 0859  Gross per 24 hour  Intake   1165 ml  Output    500 ml  Net    665 ml   TELEMETRY: Reviewed telemetry pt in atrial fibrillation     PHYSICAL EXAM General: Thin frail, in no acute distress  Lungs:   Clear bilaterally to auscultation anteriorly Heart:   HRRR S1 S2 Pulses are 2+ & equal. Abdomen: Bowel sounds are positive, abdomen soft and non-tender without masses  Neuro: sleepy   LABS: Basic Metabolic Panel:  Recent Labs  01/11/14 0646 01/12/14 0536  NA 145 143  K 4.3 4.4  CL 107 106  CO2 25 25  GLUCOSE 98 91  BUN 26* 20  CREATININE 0.95 0.90  CALCIUM 9.4 9.5   Liver Function Tests:  Recent Labs  01/11/14 0646  AST 24  ALT 14  ALKPHOS 92  BILITOT 0.4  PROT 7.2  ALBUMIN 2.9*   No results found for this basename: LIPASE, AMYLASE,  in the last 72 hours CBC:  Recent Labs  01/11/14 0646 01/12/14 0536  WBC 5.4 5.5  HGB 11.9* 11.2*  HCT 37.7* 34.3*  MCV 87.5 86.2  PLT 222 217   Cardiac Enzymes: No results found for this basename: CKTOTAL, CKMB, CKMBINDEX, TROPONINI,  in the last 72 hours BNP: No components found with this basename: POCBNP,  D-Dimer: No results found for this basename: DDIMER,  in the last 72 hours Hemoglobin A1C: No results found for this basename: HGBA1C,  in the last 72 hours Fasting Lipid Panel: No results found for this basename: CHOL, HDL, LDLCALC, TRIG, CHOLHDL, LDLDIRECT,  in the last 72 hours Thyroid Function Tests: No results found for this  basename: TSH, T4TOTAL, FREET3, T3FREE, THYROIDAB,  in the last 72 hours Anemia Panel: No results found for this basename: VITAMINB12, FOLATE, FERRITIN, TIBC, IRON, RETICCTPCT,  in the last 72 hours Coag Panel:   Lab Results  Component Value Date   INR 1.31 01/12/2014   INR 2.02* 01/11/2014   INR 2.05* 01/11/2014    RADIOLOGY: No results found.  ASSESSMENT:  80 year old gentleman admitted to the hospital and excruciating right lower extremity pain. He has an ischemic right lower extremity leg. He is being managed by Dr. Bridgett Larsson. He has no known history of heart disease other than pacemaker therapy for sick sinus syndrome. The patient has been followed in the past by Dr. Jacqulyn Ducking with major cardiac issues being sick sinus syndrome and permanent pacemaker. He currently denies orthopnea, PND, and chest pain. There is no lower extremity swelling.  1. Right leg ischemia, requiring analgesia and will likely require amputation, AKA  2. Sick sinus syndrome with history of atrial fibrillation and current permanent pacemaker  3. Rheumatic heart disease with severe mitral stenosis when last evaluated, 2013  4. Hypertension - controlled 5. Dementia  6. Atrial fibrillation - rate controlled  PLAN:  1. Slow heart rate as tolerated to improve left atrial emptying and help maintain relatively low left atrial pressures and therefore avoid acute heart failure.   Will use diltiazem instead of amlodipine and avoid beta blockers if possible (which could theoretically cause reflex lower extremity vasoconstriction and compound leg ischemia).  2. In absence of cardiac complaints such as chest discomfort, would not embark upon an ischemic evaluation. No matter the findings, this would not alter management.  3. Cleared for upcoming AKA of right lower extremity, in a patient who is felt to be at high risk for cardiovascular complications (greater than 5% possibility of acute complication such as heart failure,  hypotension, tachycardia, MI, etc.).  4. Update echocardiogram to exclude possibility of an expected regional wall motion abnormality/systolic dysfunction.  This is pending this am 5.  Warfarin on hold for surgery.  If ok with vascular surgery would start on IV Heparin until surgery given his valvular afib.  Sueanne Margarita, MD  01/12/2014  9:29 AM

## 2014-01-12 NOTE — Anesthesia Postprocedure Evaluation (Signed)
  Anesthesia Post-op Note  Patient: [name]  Procedure(s) Performed: Procedure(s): RIGHT  ABOVE KNEE AMPUTATION (Right)  Patient Location: PACU  Anesthesia Type:General  Level of Consciousness: awake, alert  and responds to stimulation  Airway and Oxygen Therapy: Patient Spontanous Breathing  Post-op Pain: none  Post-op Assessment: Post-op Vital signs reviewed  Post-op Vital Signs: Reviewed  Last Vitals:  Filed Vitals:   01/12/14 1805  BP: 172/93  Pulse: 65  Temp:   Resp: 17    Complications: No apparent anesthesia complications

## 2014-01-12 NOTE — Progress Notes (Signed)
INITIAL NUTRITION ASSESSMENT  DOCUMENTATION CODES Per approved criteria  -Not Applicable   INTERVENTION: Provide Ensure Complete BID in between meals when diet advanced Continue Multivitamin with minerals RD to continue to monitor  NUTRITION DIAGNOSIS: Predicted sub optimal intake related to dementia, scheduled surgery, and recent weight loss as evidenced by pt's chart and 25% meal completion yesterday.   Goal: Pt to meet >/= 90% of their estimated nutrition needs   Monitor:  Diet advancement, PO intake, weight trend, labs  Reason for Assessment: Malnutrition Screening Tool  80 y.o. 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 Scheduled for Right AKA.  Pt asleep at time of visit. Obvious temporal wasting. Pt made NPO at midnight for planned surgery. Yesterday pt was on a heart healthy diet and ate 25% of meals per nursing notes. Weight history shows that patient has lost 23 lbs in the past 3-4 months; 17% weight loss in less than 6 months is severe for time frame.  Suspect some form of malnutrition based on temporal wasting and history of weight loss.  Labs reviewed.   Height: Ht Readings from Last 1 Encounters:  01/11/14 5\' 2"  (1.575 m)    Weight: Wt Readings from Last 1 Encounters:  01/12/14 108 lb 1.6 oz (49.034 kg)    Ideal Body Weight: 118 lbs  % Ideal Body Weight: 92%  Wt Readings from Last 10 Encounters:  01/12/14 108 lb 1.6 oz (49.034 kg)  01/12/14 108 lb 1.6 oz (49.034 kg)  01/12/14 108 lb 1.6 oz (49.034 kg)  01/07/14 103 lb (46.72 kg)  10/04/13 131 lb 1.6 oz (59.467 kg)  10/04/13 131 lb 1.6 oz (59.467 kg)  12/14/12 133 lb 12 oz (60.669 kg)  06/15/12 119 lb (53.978 kg)  01/23/12 115 lb 12.8 oz (52.527 kg)  12/20/11 110 lb (49.896 kg)    Usual  Body Weight: unknown  % Usual Body Weight: NA  BMI:  Body mass index is 19.77 kg/(m^2).  Estimated Nutritional Needs: Kcal: 1400-1600 Protein: 60-70 grams Fluid: 1.4-1.6 L/day  Skin: intact  Diet Order: NPO  EDUCATION NEEDS: -No education needs identified at this time   Intake/Output Summary (Last 24 hours) at 01/12/14 1001 Last data filed at 01/12/14 0859  Gross per 24 hour  Intake   1165 ml  Output    500 ml  Net    665 ml    Last BM: PTA  Labs:   Recent Labs Lab 01/11/14 0646 01/12/14 0536  NA 145 143  K 4.3 4.4  CL 107 106  CO2 25 25  BUN 26* 20  CREATININE 0.95 0.90  CALCIUM 9.4 9.5  GLUCOSE 98 91    CBG (last 3)  No results found for this basename: GLUCAP,  in the last 72 hours  Scheduled Meds: . calcium-vitamin D  1 tablet Oral BID  . diltiazem  180 mg Oral Daily  . docusate sodium  100 mg Oral BID  . donepezil  5 mg Oral Daily  . feeding supplement (GLUCERNA SHAKE)  237 mL Oral QHS  . latanoprost  1 drop Both Eyes QHS  . losartan  100 mg Oral Daily  . mirtazapine  15 mg Oral QHS  . multivitamin with minerals  1 tablet Oral Daily    Continuous Infusions:   Past Medical History  Diagnosis Date  .  Rheumatic heart disease     moderate mitral stenosis and history of CHF  . Hypertension   . Sick sinus syndrome      Paroxysmal and then constant AF; slow rate on modest AV nodal blocking agents  . Chronic low back pain   . Atrial fibrillation     Per record has Afib/Aflutter with SSS  . PVD (peripheral vascular disease)     Right superficial femoral to peroneal artery bypass graft with nonreversed saphenous vein composite  2002  . Mesenteric ischemia     SMA artery embolectomy in 2002  . Dementia     mild/H&P (12/13/2011)  . Prostate cancer   . Shortness of breath     "@ night" (12/13/2011)  . Pacemaker   . Pacemaker     Past Surgical History  Procedure Laterality Date  . Transurethral resection of prostate    . Embolectomy  2002     SMA artery  . Femoral-peroneal bypass graft  2002    right superficial (12/13/2011)  . Orif hip fracture Right 09/30/2013    Procedure: OPEN TREATMENT INTERNAL FIXATION OF RIGHT HIP FRACTURE;  Surgeon: Sanjuana Kava, MD;  Location: [facility];  Service: Orthopedics;  Laterality: Right;    Pryor Ochoa RD, LDN Inpatient Clinical Dietitian Pager: [phone] After Hours Pager: [phone]

## 2014-01-12 NOTE — Transfer of Care (Signed)
Immediate Anesthesia Transfer of Care Note  Patient: [name]  Procedure(s) Performed: Procedure(s): RIGHT  ABOVE KNEE AMPUTATION (Right)  Patient Location: PACU  Anesthesia Type:General  Level of Consciousness: awake, alert  and oriented  Airway & Oxygen Therapy: Patient Spontanous Breathing and Patient connected to nasal cannula oxygen  Post-op Assessment: Report given to PACU RN and Post -op Vital signs reviewed and stable  Post vital signs: Reviewed and stable  Complications: No apparent anesthesia complications

## 2014-01-12 NOTE — Progress Notes (Signed)
Pt sleepy. Arouses to cold stimuli (cold hands on skin or blanket off arms. Dr. Deatra Canter at bedside to assess pt. Vital signs stable. Dressing clean.  Mr Claxton asked "Is surgery was over?" And went back to sleep. Dr. Ola Spurr released pt to return to telemetry unit 3 E

## 2014-01-12 NOTE — OR Nursing (Signed)
Dr. Bridgett Larsson ordered a Urine analysis with an In and Out catheter. In and Out catheter was attempted but unsuccessful. Dr. Bridgett Larsson was notified and okay with not getting one.

## 2014-01-12 NOTE — Anesthesia Procedure Notes (Signed)
Procedure Name: Intubation Date/Time: 01/12/2014 3:45 PM Performed by: Eligha Bridegroom Pre-anesthesia Checklist: Emergency Drugs available, Patient identified, Timeout performed, Suction available and Patient being monitored Patient Re-evaluated:Patient Re-evaluated prior to inductionOxygen Delivery Method: Circle system utilized Preoxygenation: Pre-oxygenation with 100% oxygen Intubation Type: IV induction Ventilation: Mask ventilation without difficulty Laryngoscope Size: Mac and 4 Grade View: Grade I Tube type: Oral Tube size: 7.5 mm Number of attempts: 1 Airway Equipment and Method: Stylet Placement Confirmation: positive ETCO2,  ETT inserted through vocal cords under direct vision and breath sounds checked- equal and bilateral Secured at: 21 cm Tube secured with: Tape Dental Injury: Teeth and Oropharynx as per pre-operative assessment

## 2014-01-12 NOTE — Progress Notes (Signed)
Call to Dr. Suzette Battiest to report runs of PVC's. He will be here to see Patient soon.

## 2014-01-12 NOTE — Op Note (Signed)
    OPERATIVE NOTE   PROCEDURE: right above-the-knee amputation  PRE-OPERATIVE DIAGNOSIS: right foot gangrene  POST-OPERATIVE DIAGNOSIS: same as above  SURGEON: Adele Barthel, MD  ASSISTANT(S): Silva Bandy, PAC   ANESTHESIA: general  ESTIMATED BLOOD LOSS: 50 cc  FINDING(S): 1.  Viable but atrophied muscle in right thigh 2.  Chronic contracture of Right hip  SPECIMEN(S):  right above-the-knee amputation  INDICATIONS:   [name] is a 80 y.o. male who presents with right foot gangrene and rest pain.  The patient is scheduled for a right above-the-knee amputation.  I discussed in depth with the patient the risks, benefits, and alternatives to this procedure.  The patient is aware that the risk of this operation included but are not limited to:  bleeding, infection, myocardial infarction, stroke, death, failure to heal amputation wound, and possible need for more proximal amputation.  The patient is aware of the risks and agrees proceed forward with the procedure.  DESCRIPTION: After full informed written consent was obtained from the patient, the patient was brought back to the operating room, and placed supine upon the operating table.  Prior to induction, the patient received IV antibiotics.  The patient was then prepped and draped in the standard fashion for an above-the-knee amputation.  I placed a non-sterile tourniquet on the thigh prior to the procedure.  After obtaining adequate anesthesia, the patient was prepped and draped in the standard fashion for a above-the-knee amputation.  I marked out the anterior and posterior flaps for a fish-mouth type of amputation.  I exsanguinated the leg with an Esmarch bandage and then inflated the tourniquet to 250 mm Hg.   I made the incisions for these flaps, and then dissected through the subcutaneous tissue, fascia, and muscles circumferentially.  I elevated  the periosteal tissue 4 cm more proximal than the anterior skin flap.  I then  transected the femur with a power saw at this level.  Then I smoothed out the rough edges of the bone with a rasp.  At this point, the specimen was passed off the field as the above-the-knee amputation.  At this point, I clamped all visibly bleeding arteries and veins using a combination of suture ligation with Vicryl suture and electrocautery.  The tourniquet was then deflated at this point.  Bleeding continued to be controlled with electrocautery and suture ligature.  The stump was washed off with sterile normal saline and no further active bleeding was noted.  I reapproximated the anterior and posterior fascia  with interrupted stitches of 2-0 Vicryl.  This was completed along the entire length of anterior and posterior fascia until there were no more loose space in the fascial line.  The skin was then  reapproximated with staples.  The stump was washed off and dried.  The incision was dressed with [employer] and  then fluffs were applied.  Kerlix was wrapped around the leg and then gently an ACE wrap was applied.    COMPLICATIONS: none  CONDITION: stable   Adele Barthel, MD [facility] [place] Office: [phone] Pager: [phone]  01/12/2014, 5:06 PM

## 2014-01-12 NOTE — Anesthesia Preprocedure Evaluation (Addendum)
Anesthesia Evaluation  Patient identified by MRN, date of birth, ID band Patient confused    Reviewed: Allergy & Precautions, H&P , NPO status , Patient's Chart, lab work & pertinent test results  Airway Mallampati: I  TM Distance: >3 FB Neck ROM: full    Dental no notable dental hx.    Pulmonary neg pulmonary ROS,  breath sounds clear to auscultation  Pulmonary exam normal       Cardiovascular hypertension, On Medications + Peripheral Vascular Disease + dysrhythmias Atrial Fibrillation + pacemaker + Valvular Problems/Murmurs Rhythm:regular Rate:Normal  Severe mitral stenosis Sick sinus syndrome   Neuro/Psych Dementia negative psych ROS   GI/Hepatic negative GI ROS, Neg liver ROS,   Endo/Other  negative endocrine ROS  Renal/GU negative Renal ROS  negative genitourinary   Musculoskeletal   Abdominal   Peds  Hematology negative hematology ROS (+)   Anesthesia Other Findings   Reproductive/Obstetrics negative OB ROS                            Anesthesia Physical  Anesthesia Plan  ASA: IV  Anesthesia Plan: Spinal   Post-op Pain Management:    Induction: Intravenous  Airway Management Planned: Simple Face Mask  Additional Equipment:   Intra-op Plan:   Post-operative Plan:   Informed Consent: I have reviewed the patients History and Physical, chart, labs and discussed the procedure including the risks, benefits and alternatives for the proposed anesthesia with the patient or authorized representative who has indicated his/her understanding and acceptance.   Dental advisory given  Plan Discussed with: CRNA  Anesthesia Plan Comments: (INR 1.3. Last dose coumadin documented at 10/22. )        Anesthesia Quick Evaluation

## 2014-01-12 NOTE — Interval H&P Note (Signed)
[facility] [place]  History and Physical Update  The patient was interviewed and re-examined.  The patient's previous History and Physical has been reviewed and is unchanged from my consult except for: interval admission for reversal of coumadin.  There is no change in the plan of care: R AKA.  Adele Barthel, MD [facility] [place] Office: [phone] Pager: [phone]  01/12/2014, 8:14 AM

## 2014-01-12 NOTE — Progress Notes (Signed)
Patient returned to unit from PACU, vital signs stable.  Patient easily arousable to name, oriented x1 (to self) and at baseline.  Right stump elevated on pillow per order.  Will report to night RN, who will continue to monitor.

## 2014-01-13 LAB — BASIC METABOLIC PANEL
ANION GAP: 14 (ref 5–15)
BUN: 18 mg/dL (ref 6–23)
CHLORIDE: 107 meq/L (ref 96–112)
CO2: 24 mEq/L (ref 19–32)
Calcium: 8.9 mg/dL (ref 8.4–10.5)
Creatinine, Ser: 0.91 mg/dL (ref 0.50–1.35)
GFR, EST AFRICAN AMERICAN: 86 mL/min — AB (ref >90)
GFR, EST NON AFRICAN AMERICAN: 74 mL/min — AB (ref >90)
Glucose, Bld: 67 mg/dL — ABNORMAL LOW (ref 70–99)
POTASSIUM: 4.6 meq/L (ref 3.7–5.3)
SODIUM: 145 meq/L (ref 137–147)

## 2014-01-13 LAB — CBC
HCT: 36.7 % — ABNORMAL LOW (ref 39.0–52.0)
HEMOGLOBIN: 12 g/dL — AB (ref 13.0–17.0)
MCH: 27.7 pg (ref 26.0–34.0)
MCHC: 32.7 g/dL (ref 30.0–36.0)
MCV: 84.8 fL (ref 78.0–100.0)
Platelets: 185 10*3/uL (ref 150–400)
RBC: 4.33 MIL/uL (ref 4.22–5.81)
RDW: 14.4 % (ref 11.5–15.5)
WBC: 9.1 10*3/uL (ref 4.0–10.5)

## 2014-01-13 LAB — GLUCOSE, CAPILLARY
GLUCOSE-CAPILLARY: 72 mg/dL (ref 70–99)
Glucose-Capillary: 90 mg/dL (ref 70–99)

## 2014-01-13 MED ORDER — GLUCAGON HCL RDNA (DIAGNOSTIC) 1 MG IJ SOLR: 1.0000 mg | Freq: Once | INTRAMUSCULAR | Status: AC | PRN

## 2014-01-13 MED ORDER — DEXTROSE 50 % IV SOLN: 50.0000 mL | Freq: Once | INTRAVENOUS | Status: AC | PRN

## 2014-01-13 MED ORDER — ENSURE COMPLETE PO LIQD: 237.0000 mL | Freq: Two times a day (BID) | ORAL | Status: DC

## 2014-01-13 MED ORDER — GLUCAGON HCL RDNA (DIAGNOSTIC) 1 MG IJ SOLR: 0.5000 mg | Freq: Once | INTRAMUSCULAR | Status: AC | PRN

## 2014-01-13 MED ORDER — DEXTROSE 5 % IV SOLN: INTRAVENOUS | Status: DC

## 2014-01-13 MED ORDER — GLUCOSE 40 % PO GEL: 1.0000 | ORAL | Status: DC | PRN

## 2014-01-13 MED ORDER — DEXTROSE 5 % IV SOLN
INTRAVENOUS | Status: DC
  Administered 2014-01-13: 1000 mL via INTRAVENOUS
  Administered 2014-01-14: 06:00:00 via INTRAVENOUS

## 2014-01-13 MED ORDER — DEXTROSE 50 % IV SOLN
25.0000 mL | Freq: Once | INTRAVENOUS | Status: AC | PRN
  Filled 2014-01-13: qty 50

## 2014-01-13 MED ORDER — CETYLPYRIDINIUM CHLORIDE 0.05 % MT LIQD: 7.0000 mL | Freq: Two times a day (BID) | OROMUCOSAL | Status: DC

## 2014-01-13 MED ORDER — GLUCOSE 40 % PO GEL: 1.0000 | Freq: Once | ORAL | Status: AC | PRN

## 2014-01-13 MED ORDER — GLUCOSE-VITAMIN C 4-6 GM-MG PO CHEW: 4.0000 | CHEWABLE_TABLET | ORAL | Status: DC | PRN

## 2014-01-13 MED ORDER — DEXTROSE 50 % IV SOLN: 25.0000 mL | Freq: Once | INTRAVENOUS | Status: AC | PRN

## 2014-01-13 NOTE — Progress Notes (Addendum)
   Progress Note   01/13/2014 9:03 AM POD 1  Subjective:  Sleepy this morning.   Tmax 100.5 BP sys 100s-170s 02  100% 2L Filed Vitals:   01/13/14 0552  BP: 139/64  Pulse: 67  Temp: 99.5 F (37.5 C)  Resp: 16    Physical Exam:  Right leg AKA wrapped. Dressing is clean.   CBC    Component Value Date/Time   WBC 9.1 01/13/2014 0515   RBC 4.33 01/13/2014 0515   HGB 12.0* 01/13/2014 0515   HCT 36.7* 01/13/2014 0515   PLT 185 01/13/2014 0515   MCV 84.8 01/13/2014 0515   MCH 27.7 01/13/2014 0515   MCHC 32.7 01/13/2014 0515   RDW 14.4 01/13/2014 0515   LYMPHSABS 0.9 10/02/2013 0622   MONOABS 1.5* 10/02/2013 0622   EOSABS 0.1 10/02/2013 0622   BASOSABS 0.0 10/02/2013 0622    BMET    Component Value Date/Time   NA 145 01/13/2014 0515   K 4.6 01/13/2014 0515   CL 107 01/13/2014 0515   CO2 24 01/13/2014 0515   GLUCOSE 67* 01/13/2014 0515   BUN 18 01/13/2014 0515   CREATININE 0.91 01/13/2014 0515   CREATININE 1.10 06/27/2011 1413   CALCIUM 8.9 01/13/2014 0515   GFRNONAA 74* 01/13/2014 0515   GFRAA 86* 01/13/2014 0515    INR    Component Value Date/Time   INR 1.31 01/12/2014 0536   INR 3.0 09/23/2013 1005   INR 2.9 05/17/2010 1129     Intake/Output Summary (Last 24 hours) at 01/13/14 0903 Last data filed at 01/13/14 0601  Gross per 24 hour  Intake   1650 ml  Output    400 ml  Net   1250 ml     Assessment/Plan:  80 y.o. male is s/p right above knee amputation  POD 1  -Keep right stump on pillow. -Dressing is clean. Will take down dressing tomorrow.    Virgina Jock, PA-C [facility] Office: [phone] Pager: [phone] 01/13/2014 9:03 AM   Addendum  I have independently interviewed and examined the patient, and I agree with the physician assistant's findings.  Suspect no rehab potential as evident by chronic right hip contracture.  He is essentially bed bound.  This R AKA was done for palliation of his rest pain in his R leg.   Return to SNF tomorrow.  Adele Barthel, MD [facility] [place] Office: [phone] Pager: [phone]  01/13/2014, 10:07 AM   Addendum  Per CDI request:  1.  Severe protein malnutrition by laboratory (Alb 2.9) and clinical findings of muscle wasting throughout this patient's muscle beds   Adele Barthel, MD [facility] [place] Office: [phone] Pager: [phone]  01/24/2014, 7:39 AM

## 2014-01-13 NOTE — Evaluation (Signed)
Physical Therapy Evaluation Patient Details Name: CARON TARDIF MRN: [id number] DOB: 1924-06-04 Today's Date: 01/13/2014   History of Present Illness  pt presents with R AKA.    Clinical Impression  Shortly after writing cancel note RN indicated to PT that pt had awoken somewhat.  Pt with very limited participation in mobility and seemed painful with simply touching his shoulder.  RN aware of his pain and has been medicating.  Pt not verbalizing, only moans.  Will defer further PT until pt returns to SNF.  Will sign off.      Follow Up Recommendations SNF    Equipment Recommendations  None recommended by PT    Recommendations for Other Services       Precautions / Restrictions Precautions Precautions: Fall Restrictions Weight Bearing Restrictions: No      Mobility  Bed Mobility Overal bed mobility: Needs Assistance;+2 for physical assistance Bed Mobility: Supine to Sit;Sit to Supine;Rolling Rolling: Total assist   Supine to sit: Max assist Sit to supine: Max assist   General bed mobility comments: pt did seem to grasp bed rail when coming up to sitting.    Transfers                    Ambulation/Gait                Stairs            Wheelchair Mobility    Modified Rankin (Stroke Patients Only)       Balance Overall balance assessment: Needs assistance Sitting-balance support: Feet unsupported Sitting balance-Leahy Scale: Zero                                       Pertinent Vitals/Pain Pain Assessment: Faces Faces Pain Scale: Hurts whole lot Pain Location: Difficult to assess, but seems to hurt all over.   Pain Intervention(s): Premedicated before session;Repositioned    Home Living Family/patient expects to be discharged to:: Skilled nursing facility                 Additional Comments: pt not verbalizing, however per chart pt from [facility].      Prior Function Level of Independence: Needs assistance    Gait / Transfers Assistance Needed: Per staff at [facility] pt was 1 person SPT to W/C, but did not ambulate.     ADL's / Homemaking Assistance Needed: PerStaff at [facility] pt needed A with ADLs and sometimes was able to tell staff about needs, but not consistent.          Hand Dominance   Dominant Hand: Right    Extremity/Trunk Assessment   Upper Extremity Assessment: Defer to OT evaluation           Lower Extremity Assessment: Generalized weakness;Difficult to assess due to impaired cognition      Cervical / Trunk Assessment: Kyphotic  Communication   Communication:  (Not verbalizing.)  Cognition Arousal/Alertness: Lethargic Behavior During Therapy: Flat affect Overall Cognitive Status: Difficult to assess                      General Comments      Exercises        Assessment/Plan    PT Assessment All further PT needs can be met in the next venue of care  PT Diagnosis Generalized weakness;Altered mental status   PT Problem List  PT Treatment Interventions     PT Goals (Current goals can be found in the Care Plan section) Acute Rehab PT Goals PT Goal Formulation: All assessment and education complete, DC therapy    Frequency     Barriers to discharge        Co-evaluation               End of Session   Activity Tolerance: Patient limited by lethargy Patient left: in bed;with call bell/phone within reach;with bed alarm set Nurse Communication: Mobility status;Need for lift equipment         Time: 0947-1000 PT Time Calculation (min): 13 min   Charges:   PT Evaluation $Initial PT Evaluation Tier I: 1 Procedure     PT G CodesCatarina Hartshorn, [place] 01/13/2014, 10:13 AM

## 2014-01-13 NOTE — Progress Notes (Signed)
Physical medicine and rehabilitation consult requested. Patient presently a resident at [facility]. Plan is to return back to skilled nursing facility. Hold on formal rehabilitation consult at this time with recommendations of skilled nursing facility

## 2014-01-13 NOTE — Evaluation (Signed)
Occupational Therapy Evaluation Patient Details Name: [name] MRN: [id number] DOB: 11/18/1924 Today's Date: 01/13/2014    History of Present Illness pt presents with R AKA.     Clinical Impression   At baseline, pt is dependent in ADL and transfers with one person assist, but does not ambulate.  No acute OT needs.Will defer further OT to SNF.   Follow Up Recommendations  SNF;Supervision/Assistance - 24 hour    Equipment Recommendations       Recommendations for Other Services       Precautions / Restrictions Precautions Precautions: Fall Restrictions Weight Bearing Restrictions: No      Mobility Bed Mobility Overal bed mobility: Needs Assistance;+2 for physical assistance Bed Mobility: Supine to Sit;Sit to Supine;Rolling Rolling: Total assist   Supine to sit: Max assist Sit to supine: Max assist     Transfers                      Balance Overall balance assessment: Needs assistance Sitting-balance support: Feet unsupported Sitting balance-Leahy Scale: Zero                                      ADL Overall ADL's : Needs assistance/impaired                                       General ADL Comments: Requires total assist with all ADL.     Vision                     Perception     Praxis      Pertinent Vitals/Pain Pain Assessment: Faces Faces Pain Scale: Hurts whole lot Pain Location: pt moans and grimaces with any touch Pain Intervention(s): Premedicated before session;Monitored during session;Limited activity within patient's tolerance     Hand Dominance Right   Extremity/Trunk Assessment Upper Extremity Assessment Upper Extremity Assessment: Generalized weakness   Lower Extremity Assessment Lower Extremity Assessment: Defer to PT evaluation   Cervical / Trunk Assessment Cervical / Trunk Assessment: Kyphotic   Communication Communication Communication: HOH   Cognition  Arousal/Alertness: Lethargic;Suspect due to medications Behavior During Therapy: Flat affect Overall Cognitive Status: Difficult to assess                     General Comments       Exercises       Shoulder Instructions      Home Living Family/patient expects to be discharged to:: Skilled nursing facility                                 Additional Comments: pt not verbalizing, however per chart pt from [facility].        Prior Functioning/Environment Level of Independence: Needs assistance  Gait / Transfers Assistance Needed: Per staff at [facility] pt was 1 person SPT to W/C, but did not ambulate.    ADL's / Homemaking Assistance Needed: PerStaff at [facility] pt needed A with ADLs and sometimes was able to tell staff about needs, but not consistent.          OT Diagnosis: Generalized weakness;Cognitive deficits;Acute pain   OT Problem List: Decreased strength;Decreased activity tolerance;Impaired balance (sitting and/or standing);Pain;Decreased cognition   OT  Treatment/Interventions:      OT Goals(Current goals can be found in the care plan section)    OT Frequency:     Barriers to D/C:            Co-evaluation              End of Session    Activity Tolerance: Patient limited by fatigue;Patient limited by pain Patient left: in bed;with call bell/phone within reach;with bed alarm set   Time: 1235-1250 OT Time Calculation (min): 15 min Charges:  OT General Charges $OT Visit: 1 Procedure OT Evaluation $Initial OT Evaluation Tier I: 1 Procedure G-Codes:    Malka So 01/13/2014, 1:41 PM [phone]

## 2014-01-13 NOTE — Progress Notes (Signed)
PT Cancellation Note  Patient Details Name: [name] MRN: [id number] DOB: [birth year]   Cancelled Treatment:    Reason Eval/Treat Not Completed: Fatigue/lethargy limiting ability to participate.  Unable to arouse pt and RN aware.  Will try another time.     Latanja Lehenbauer, [place] Papas 01/13/2014, 9:43 AM

## 2014-01-13 NOTE — Progress Notes (Signed)
Dr. Bridgett Larsson notified as patient has been drowsy and intermittently moaning throughout shift.  Patient not verbalizing at this time.  PAINAD scale used.  Per Dr. Bridgett Larsson, this is patient's baseline, hold narcotics for drowsiness.  Dr. Bridgett Larsson aware that patient not taking PO medications and fluids at this time.  Will hold PO medications and PRN narcotics and continue to monitor.

## 2014-01-13 NOTE — Progress Notes (Signed)
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 Does wake up and respond to pain  CURRENT MEDS . calcium-vitamin D  1 tablet Oral BID  . cefUROXime (ZINACEF)  IV  1.5 g Intravenous Q12H  . diltiazem  180 mg Oral Daily  . docusate sodium  100 mg Oral BID  . donepezil  5 mg Oral Daily  . feeding supplement (ENSURE COMPLETE)  237 mL Oral BID BM  . feeding supplement (GLUCERNA SHAKE)  237 mL Oral QHS  . heparin  5,000 Units Subcutaneous 3 times per day  . latanoprost  1 drop Both Eyes QHS  . losartan  100 mg Oral Daily  . mirtazapine  15 mg Oral QHS  . multivitamin with minerals  1 tablet Oral Daily    OBJECTIVE  Filed Vitals:   01/12/14 2104 01/13/14 0216 01/13/14 0255 01/13/14 0552  BP: 145/84 155/64  139/64  Pulse:  82  67  Temp:  100.5 F (38.1 C) 99 F (37.2 C) 99.5 F (37.5 C)  TempSrc:  Axillary Axillary Axillary  Resp:  17  16  Height:      Weight:    102 lb 4.7 oz (46.4 kg)  SpO2:  100%  100%    Intake/Output Summary (Last 24 hours) at 01/13/14 0904 Last data filed at 01/13/14 0601  Gross per 24 hour  Intake   1650 ml  Output    400 ml  Net   1250 ml   Filed Weights   01/11/14 1028 01/12/14 0503 01/13/14 0552  Weight: 111 lb (50.349 kg) 108 lb 1.6 oz (49.034 kg) 102 lb 4.7 oz (46.4 kg)    PHYSICAL EXAM  General: Pleasant, NAD. Neuro: Alert and oriented X 3. Moves all extremities spontaneously. Psych: Normal affect. HEENT:  Normal  Neck: Supple without bruits or JVD. Lungs:  Resp regular and unlabored, anterior exam CTA. Heart: irregular no s3, s4, or murmurs. Abdomen: Soft, non-tender, non-distended, BS + x 4.  Extremities: No clubbing, cyanosis or edema. S/p R AKA, no obvious bleeding, dressing in place  Accessory Clinical Findings  CBC  Recent  Labs  01/12/14 2023 01/13/14 0515  WBC 6.9 9.1  HGB 11.9* 12.0*  HCT 36.9* 36.7*  MCV 86.8 84.8  PLT 188 161   Basic Metabolic Panel  Recent Labs  01/12/14 0536 01/12/14 2023 01/13/14 0515  NA 143  --  145  K 4.4  --  4.6  CL 106  --  107  CO2 25  --  24  GLUCOSE 91  --  67*  BUN 20  --  18  CREATININE 0.90 0.95 0.91  CALCIUM 9.5  --  8.9   Liver Function Tests  Recent Labs  01/11/14 0646  AST 24  ALT 14  ALKPHOS 92  BILITOT 0.4  PROT 7.2  ALBUMIN 2.9*    TELE A-fib with alternating ventricularly paced rhythm    ECG  No new EKG  Echocardiogram 12/14/2011  - Left ventricle: The cavity size was normal. Wall thickness was normal. Systolic function was normal. The estimated ejection fraction was in the range of 55% to 60%. Wall motion was normal; there were no regional wall motion abnormalities. - Aortic valve: Mild regurgitation. - Mitral valve: Calcified annulus. Mild thickening, consistent with rheumatic disease. The  findings are consistent with severe stenosis. Mild regurgitation. Valve area by pressure half-time: 1.02cm^2. - Left atrium: The atrium was severely dilated. - Right ventricle: The cavity size was mildly dilated. - Right atrium: The atrium was severely dilated. - Pulmonary arteries: Systolic pressure was moderately to severely increased. PA peak pressure: 86mm Hg (S). - Pericardium, extracardiac: A trivial pericardial effusion was identified. Impressions:  - Spontaneous contrast and probable thrombus noted in left atrium.      Radiology/Studies  No results found.  ASSESSMENT AND PLAN  80 year old gentleman admitted to the hospital and excruciating right lower extremity pain. He has an ischemic right lower extremity leg. He is being managed by Dr. Bridgett Larsson. He has no known history of heart disease other than pacemaker therapy for sick sinus syndrome. The patient has been followed in the past by Dr. Jacqulyn Ducking with major  cardiac issues being sick sinus syndrome and permanent pacemaker. He currently denies orthopnea, PND, and chest pain. There is no lower extremity swelling.   1. Right leg ischemia  - s/p AKA on 10/28 by Dr. Bridgett Larsson yesterday, appears to be stable  - pending echo to check wall motion.   2. Sick sinus syndrome with history of atrial fibrillation and current permanent pacemaker  3. Rheumatic heart disease with severe mitral stenosis when last evaluated, 2013  4. Hypertension - controlled  5. Dementia  6. Atrial fibrillation - rate controlled  - continue diltiazem. Currently on DVT heparin, once bleeding risk is low, switch to heparin gtt and restart coumadin   Weston Brass Woodward Ku Pager: [phone]  Patient seen and personally examined and agree with note above as outlined by Almyra Deforest, PA with minor changes. Rate controlled in afib.  Hemodynamically stable.  Restart Coumadin when ok with Vascular surgery.  Consider changing to IV heparin gtt while reloading coumadin if ok with Vascular.  Signed: Fransico Him, MD [facility] 01/13/2014

## 2014-01-14 ENCOUNTER — Encounter (HOSPITAL_COMMUNITY): Payer: Self-pay | Admitting: Vascular Surgery

## 2014-01-14 ENCOUNTER — Telehealth: Payer: Self-pay | Admitting: Vascular Surgery

## 2014-01-14 LAB — BASIC METABOLIC PANEL
Anion gap: 12 (ref 5–15)
BUN: 15 mg/dL (ref 6–23)
CALCIUM: 8.8 mg/dL (ref 8.4–10.5)
CO2: 25 mEq/L (ref 19–32)
Chloride: 105 mEq/L (ref 96–112)
Creatinine, Ser: 0.71 mg/dL (ref 0.50–1.35)
GFR calc Af Amer: >90 mL/min (ref >90)
GFR calc non Af Amer: 82 mL/min — ABNORMAL LOW (ref >90)
GLUCOSE: 112 mg/dL — AB (ref 70–99)
POTASSIUM: 3.9 meq/L (ref 3.7–5.3)
SODIUM: 142 meq/L (ref 137–147)

## 2014-01-14 LAB — CBC
HCT: 36.9 % — ABNORMAL LOW (ref 39.0–52.0)
Hemoglobin: 12 g/dL — ABNORMAL LOW (ref 13.0–17.0)
MCH: 27.6 pg (ref 26.0–34.0)
MCHC: 32.5 g/dL (ref 30.0–36.0)
MCV: 84.8 fL (ref 78.0–100.0)
Platelets: 185 10*3/uL (ref 150–400)
RBC: 4.35 MIL/uL (ref 4.22–5.81)
RDW: 14.3 % (ref 11.5–15.5)
WBC: 7.8 10*3/uL (ref 4.0–10.5)

## 2014-01-14 MED ORDER — WARFARIN SODIUM 5 MG PO TABS
5.0000 mg | ORAL_TABLET | Freq: Every day | ORAL | Status: DC
  Administered 2014-01-14: 5 mg via ORAL
  Filled 2014-01-14: qty 1

## 2014-01-14 MED ORDER — ENOXAPARIN SODIUM 40 MG/0.4ML ~~LOC~~ SOLN: 40.0000 mg | Freq: Two times a day (BID) | SUBCUTANEOUS | Status: DC

## 2014-01-14 MED ORDER — WARFARIN - PHYSICIAN DOSING INPATIENT: Freq: Every day | Status: DC

## 2014-01-14 MED ORDER — HYDROCODONE-ACETAMINOPHEN 5-325 MG PO TABS: 1.0000 | ORAL_TABLET | Freq: Four times a day (QID) | ORAL | Status: DC | PRN

## 2014-01-14 MED ORDER — ENOXAPARIN SODIUM 40 MG/0.4ML ~~LOC~~ SOLN
40.0000 mg | Freq: Two times a day (BID) | SUBCUTANEOUS | Status: DC
  Administered 2014-01-14: 40 mg via SUBCUTANEOUS
  Filled 2014-01-14 (×2): qty 0.4

## 2014-01-14 NOTE — Progress Notes (Signed)
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 Respond to pain. Denies any discomfort other than pain.  CURRENT MEDS . antiseptic oral rinse  7 mL Mouth Rinse BID  . calcium-vitamin D  1 tablet Oral BID  . diltiazem  180 mg Oral Daily  . docusate sodium  100 mg Oral BID  . donepezil  5 mg Oral Daily  . feeding supplement (ENSURE COMPLETE)  237 mL Oral BID BM  . feeding supplement (GLUCERNA SHAKE)  237 mL Oral QHS  . heparin  5,000 Units Subcutaneous 3 times per day  . latanoprost  1 drop Both Eyes QHS  . losartan  100 mg Oral Daily  . mirtazapine  15 mg Oral QHS  . multivitamin with minerals  1 tablet Oral Daily    OBJECTIVE  Filed Vitals:   01/13/14 1014 01/13/14 1451 01/13/14 2041 01/14/14 0502  BP: 145/68 132/58 141/78 129/81  Pulse: 65 79 84 64  Temp: 99.1 F (37.3 C) 97.4 F (36.3 C) 97 F (36.1 C) 96.7 F (35.9 C)  TempSrc: Axillary Axillary Axillary Oral  Resp: 19 18 18 18   Height:      Weight:    102 lb 4.7 oz (46.4 kg)  SpO2: 100% 100% 100% 100%    Intake/Output Summary (Last 24 hours) at 01/14/14 0806 Last data filed at 01/13/14 2157  Gross per 24 hour  Intake 1067.17 ml  Output    100 ml  Net 967.17 ml   Filed Weights   01/12/14 0503 01/13/14 0552 01/14/14 0502  Weight: 108 lb 1.6 oz (49.034 kg) 102 lb 4.7 oz (46.4 kg) 102 lb 4.7 oz (46.4 kg)    PHYSICAL EXAM  General: Pleasant, NAD. Neuro: Alert and oriented X 3. Moves all extremities spontaneously. Psych: Normal affect. HEENT:  Normal  Neck: Supple without bruits or JVD. Lungs:  Resp regular and unlabored, CTA. Heart: irregular no s3, s4, or murmurs. Abdomen: Soft, non-tender, non-distended, BS + x 4.  Extremities: No clubbing, cyanosis or edema.  DP/PT/Radials 2+ and equal bilaterally.  Accessory Clinical Findings  CBC  Recent Labs  01/13/14 0515 01/14/14 0352  WBC 9.1 7.8  HGB 12.0* 12.0*  HCT 36.7* 36.9*  MCV 84.8 84.8  PLT 185 329   Basic Metabolic Panel  Recent Labs  01/13/14 0515 01/14/14 0352  NA 145 142  K 4.6 3.9  CL 107 105  CO2 24 25  GLUCOSE 67* 112*  BUN 18 15  CREATININE 0.91 0.71  CALCIUM 8.9 8.8    TELE A-fib, rate controlled with occasional ventricularly paced rhythm    ECG  No new EKG  Echocardiogram 01/11/2014    LV EF: 50% - 55%  ------------------------------------------------------------------- Indications: Mitral stenosis [non-rheumatic] 424.0.  ------------------------------------------------------------------- History: PMH: Low back pain. Dementia. History of hip fracture. 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 Risk factors: Hypertension. Dyslipidemia.  ------------------------------------------------------------------- Study Conclusions  - Left ventricle: The cavity size was normal. Systolic function was low normal to mildly reduced. The estimated ejection fraction was in the range of 50% to 55%. Wall motion was normal; there were no regional wall motion abnormalities. Indeterminant diastolic function. - Aortic valve: Trileaflet; mildly calcified leaflets. There was no stenosis. There was trivial  regurgitation. - Mitral valve: Rheumatic-appearing mitral valve with hockey stick appearance to the anterior leaflet. The leaflet tips were moderately thickened and calcified. The findings are consistent with moderate stenosis. There was trivial regurgitation. Mean gradient (D): 7 mm Hg. Valve area by pressure half-time: 1.3 cm^2. - Left atrium: The atrium was severely dilated. - Right ventricle: The cavity size was mildly dilated. Pacer wire or catheter noted in right ventricle. Systolic function was normal. - Right atrium: The atrium was severely  dilated. - Tricuspid valve: There was mild-moderate regurgitation. Peak RV-RA gradient (S): 44 mm Hg. - Systemic veins: IVC not visualized.  Impressions:  - The patient was in atrial fibrillation. Normal LV size with low normal to mildly reduced systolic function, EF 74-82%. Moderate rheumatic mitral stenosis. Mildly dilated RV with normal systolic function. Severe biatrial enlargement.       Radiology/Studies  No results found.  ASSESSMENT AND PLAN  80 year old gentleman admitted to the hospital and excruciating right lower extremity pain. He has an ischemic right lower extremity leg. He is being managed by Dr. Bridgett Larsson. He has no known history of heart disease other than pacemaker therapy for sick sinus syndrome. The patient has been followed in the past by Dr. Jacqulyn Ducking with major cardiac issues being sick sinus syndrome and permanent pacemaker. He currently denies orthopnea, PND, and chest pain. There is no lower extremity swelling.   1. Right leg ischemia   - s/p AKA on 10/28 by Dr. Bridgett Larsson, appears to be stable   - Echo 10/27 EF 50-55%, no RWMA, moderate rheumatic MS, mild to moderate TR, severe biatrial enlargement    2. Sick sinus syndrome with history of atrial fibrillation and current permanent pacemaker   3. Rheumatic heart disease with severe mitral stenosis when last evaluated, 2013   4. Hypertension - controlled   5. Dementia   6. Permanent atrial fibrillation - rate controlled   - severe biatrial enlargement on echo  - continue diltiazem. Currently on DVT heparin, once bleeding risk is low, switch to heparin gtt and restart coumadin. No new recommendation    Signed, Woodward Ku Pager: [phone]  I have personally seen and examined patient and agree with note as outlined by Almyra Deforest, PA.  Per Vascular note patient will be transitioned to Lovenox and reloaded with Coumadin.  Rate controlled on Cardizem.\  Signed: Fransico Him, MD [facility] 10.30.2015

## 2014-01-14 NOTE — Progress Notes (Signed)
Orthopedic Tech Progress Note Patient Details:  [name] 26-Feb-1925 [id number] [employer] called to place brace order. Spoke with [employer]. Patient ID: [name], male   DOB: 1924/10/24, 80 y.o.   MRN: [id number]   Fenton Foy 01/14/2014, 11:42 AM

## 2014-01-14 NOTE — Discharge Summary (Signed)
Discharge Summary    [name] [birth year] 80 y.o. male  [id number]  Admission Date: 01/11/2014  Discharge Date: 01/14/14  Physician: Conrad [place], MD  Admission Diagnosis: Right leg ischemia with gangrene M62.89 Right leg ischemia with gangrene M62.89   HPI:   This is a 80 y.o. male s/p prior SMA thrombectomy with Dr. Scot Dock who presents with chief complaint: constant pain in R foot. Pt has dementia so much of his history is obtained from his son with him. Onset of symptom occurred unknown period of months ago. Pt current in SNF after R hip fracture and subsequent repair. Pain is described as sharp, severity 6-10/10, and associated with any movement of R foot. Patient has attempted to treat this pain with Tramadol. The patient has rest pain symptoms also and Right foot ulcers. Atherosclerotic risk factors include: HTN.  Hospital Course:  The patient was admitted to the hospital and underwent preoperative cardiac assessment and he was cleared for surgery.  His surgery was cancelled on 01/11/14 due to elevated INR.  He was given 2 units of FFP.   He did have a 2D echo with the following results: Study Conclusions  - Left ventricle: The cavity size was normal. Systolic function was low normal to mildly reduced. The estimated ejection fraction was in the range of 50% to 55%. Wall motion was normal; there were no regional wall motion abnormalities. Indeterminant diastolic function. - Aortic valve: Trileaflet; mildly calcified leaflets. There was no stenosis. There was trivial regurgitation. - Mitral valve: Rheumatic-appearing mitral valve with hockey stick appearance to the anterior leaflet. The leaflet tips were moderately thickened and calcified. The findings are consistent with moderate stenosis. There was trivial regurgitation. Mean gradient (D): 7 mm Hg. Valve area by pressure half-time: 1.3 cm^2. - Left atrium: The atrium was severely dilated. - Right ventricle:  The cavity size was mildly dilated. Pacer wire or catheter noted in right ventricle. Systolic function was normal. - Right atrium: The atrium was severely dilated. - Tricuspid valve: There was mild-moderate regurgitation. Peak RV-RA gradient (S): 44 mm Hg. - Systemic veins: IVC not visualized.  Impressions:  - The patient was in atrial fibrillation. Normal LV size with low normal to mildly reduced systolic function, EF 69-48%. Moderate rheumatic mitral stenosis. Mildly dilated RV with normal systolic function. Severe biatrial enlargement.   He was taken to the operating room on 01/12/2014 and underwent: Right BKA.   The pt tolerated the procedure well and was transported to the PACU in good condition. By POD 1, he was doing well.  His AKA was done for palliation of his rest pain in right leg.  On POD 2, he is restarted on his coumadin with a Lovenox bridge.    CBC    Component Value Date/Time   WBC 7.8 01/14/2014 0352   RBC 4.35 01/14/2014 0352   HGB 12.0* 01/14/2014 0352   HCT 36.9* 01/14/2014 0352   PLT 185 01/14/2014 0352   MCV 84.8 01/14/2014 0352   MCH 27.6 01/14/2014 0352   MCHC 32.5 01/14/2014 0352   RDW 14.3 01/14/2014 0352   LYMPHSABS 0.9 10/02/2013 0622   MONOABS 1.5* 10/02/2013 0622   EOSABS 0.1 10/02/2013 0622   BASOSABS 0.0 10/02/2013 0622    BMET    Component Value Date/Time   NA 142 01/14/2014 0352   K 3.9 01/14/2014 0352   CL 105 01/14/2014 0352   CO2 25 01/14/2014 0352   GLUCOSE 112* 01/14/2014 0352   BUN 15 01/14/2014  0352   CREATININE 0.71 01/14/2014 0352   CREATININE 1.10 06/27/2011 1413   CALCIUM 8.8 01/14/2014 0352   GFRNONAA 82* 01/14/2014 0352   GFRAA >90 01/14/2014 0352     Discharge Instructions:   The patient is discharged with extensive instructions on wound care and progressive ambulation.  They are instructed not to drive or perform any heavy lifting until returning to see the physician in his office.      Discharge Instructions    Call MD for:  redness, tenderness, or signs of infection (pain, swelling, bleeding, redness, odor or green/yellow discharge around incision site)    Complete by:  As directed      Call MD for:  severe or increased pain, loss or decreased feeling  in affected limb(s)    Complete by:  As directed      Call MD for:  temperature >100.5    Complete by:  As directed      Resume previous diet    Complete by:  As directed      may wash over wound with mild soap and water    Complete by:  As directed            Discharge Diagnosis:  Right leg ischemia with gangrene M62.89 Right leg ischemia with gangrene M62.89  Secondary Diagnosis: Patient Active Problem List   Diagnosis Date Noted  . 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 Extremity atherosclerosis with gangrene 01/07/2014  . Fracture, intertrochanteric, right femur 09/25/2013  . Hip fracture 09/25/2013  . Encounter for therapeutic drug monitoring 05/24/2013  . Pacemaker-[employer] 12/17/2011  . Dementia 12/06/2011  . Incontinence of urine 12/06/2011  . Hypertension 07/16/2011  . Atrial fibrillation 06/12/2010  . 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 Encounter for long-term (current) use of anticoagulants 06/12/2010  . HYPERLIPIDEMIA 09/11/2009  . Mitral stenosis 09/11/2009  . LOW BACK PAIN SYNDROME 09/11/2009  . NEOPLASM, MALIGNANT, PROSTATE, HX OF 09/11/2009  . HYPERTENSION, HX OF 09/11/2009   Past Medical History  Diagnosis Date  . Rheumatic heart disease     moderate mitral stenosis and history of CHF  . Hypertension   . Sick sinus syndrome      Paroxysmal and then constant AF; slow rate on modest AV nodal blocking agents  . Chronic low back pain   . Atrial fibrillation     Per record has Afib/Aflutter with SSS  . PVD (peripheral vascular disease)     Right superficial femoral to peroneal artery bypass graft with nonreversed saphenous vein composite  2002  . Mesenteric  ischemia     SMA artery embolectomy in 2002  . Dementia     mild/H&P (12/13/2011)  . Prostate cancer   . Shortness of breath     "@ night" (12/13/2011)  . Pacemaker   . Pacemaker        Medication List         acetaminophen 650 MG CR tablet  Commonly known as:  TYLENOL  Take 650 mg by mouth every 6 (six) hours as needed for pain.     amLODipine 5 MG tablet  Commonly known as:  NORVASC  Take 1 tablet (5 mg total) by mouth daily.     calcium-vitamin D 500-200 MG-UNIT per tablet  Commonly known as:  OSCAL WITH D  Take 1 tablet by mouth 2 (two) times daily.     docusate sodium 100 MG capsule  Commonly known as:  COLACE  Take  100 mg by mouth 2 (two) times daily.     donepezil 5 MG tablet  Commonly known as:  ARICEPT  Take 5 mg by mouth daily.     enoxaparin 40 MG/0.4ML injection  Commonly known as:  LOVENOX  Inject 0.4 mLs (40 mg total) into the skin every 12 (twelve) hours.     feeding supplement (GLUCERNA SHAKE) Liqd  Take 237 mLs by mouth at bedtime.     HYDROcodone-acetaminophen 5-325 MG per tablet  Commonly known as:  NORCO/VICODIN  Take 1-2 tablets by mouth every 6 (six) hours as needed for moderate pain.     losartan 100 MG tablet  Commonly known as:  COZAAR  Take 100 mg by mouth daily.     methocarbamol 500 MG tablet  Commonly known as:  ROBAXIN  Take 1 tablet (500 mg total) by mouth every 6 (six) hours as needed for muscle spasms.     multivitamin with minerals Tabs tablet  Take 1 tablet by mouth daily.     olmesartan 40 MG tablet  Commonly known as:  BENICAR  Take 1 tablet (40 mg total) by mouth daily.     REMERON 15 MG tablet  Generic drug:  mirtazapine  Take 15 mg by mouth at bedtime.     traMADol 50 MG tablet  Commonly known as:  ULTRAM  Take 50 mg by mouth every 6 (six) hours as needed (pain).     TRAVATAN Z 0.004 % Soln ophthalmic solution  Generic drug:  Travoprost (BAK Free)  Place 1 drop into both eyes at bedtime.     warfarin 5 MG  tablet  Commonly known as:  COUMADIN  Take 5 mg by mouth daily. Take 1 tablet (5mg ) daily except 1/2 tablet (2.5mg  ) on Mon & Thurs       ADDITIONAL INSTRUCTIONS: 1.  Pt on Lovenox bridge until INR is therapeutic.   2.  Please check INR accordingly and adjust coumadin as needed. 3.  Discontinue Lovenox when pt's INR is therapeutic (goal of INR is the same as it was prior to surgery).  4.  Keep right thigh on pillows due to hip contraction. 5.  Keep Right AKA stump bandaged with a stump sock to keep the bandage in place.  Prescriptions given: Norco #30 No Refill  Disposition: SNF  Patient's condition: is Limited  Follow up: 1. Dr. Bridgett Larsson in 4 weeks   Leontine Locket, PA-C [facility] [phone] 01/14/2014  8:59 AM  Addendum  I have independently interviewed and examined the patient, and I agree with the physician assistant's discharge summary.  This patient was admitted preoperatively as he came to the hospital fully anticoagulated despite instructions to his nursing home to hold the Coumadin 5 days prior to his procedure.  He under went the R AKA on 01/12/14 for palliation for right rest pain and foot gangrene.  His post-operative course has been unremarkable.  He was minimally interactive before the procedure and he remains the same with little rehab potential in my opinion.  Cardiology saw the patient post-operatively and recommended a heparin bridge to Coumadin for his atrial fibrillation.  He was loaded on a Lovenox bridge to Coumadin and finish his reloading in the SNF while on a Lovenox bridge.  He was discharged on 01/14/14 with an intact right AKA stump.  The stump can be covered with a ABD and a stump sock applied to keep it place.   Adele Barthel, MD [facility] [place] Office: [phone] Pager: [phone]  01/14/2014, 5:16 PM

## 2014-01-14 NOTE — Telephone Encounter (Signed)
Lm for [facility], dpm

## 2014-01-14 NOTE — Plan of Care (Signed)
Problem: Phase I Progression Outcomes Goal: Vital signs/hemodynamically stable Outcome: Progressing No acute events this shift.  Patient has stable vitals.  He is responsive to verbal as well as touch stimuli.  He continues to moan out loud this shift.  Making short statements and requests.  No complications with stump.  Will continue to monitor patient condition.

## 2014-01-14 NOTE — Progress Notes (Addendum)
   Daily Progress Note  Assessment/Planning: POD #2 s/p R AKA   Stump is viable  Ok to resume pain rx  D/C to SNF  Cardiology requesting Heparin to Coumadin: will start Lovenox and load coumadin  Obviously patient's at fall risk, given recent AKA, but given I've never seen him out of bed, I suspect his fall risk will depend more on his caretakers  Pt's PO intake has been intermittent so getting a therapeutic level with Coumadin might be tricky.  Pt has no rehab potential based on his bed bound behavior in the hospital.  Subjective  - 2 Days Post-Op  More awake and interactive today  Objective Filed Vitals:   01/13/14 1014 01/13/14 1451 01/13/14 2041 01/14/14 0502  BP: 145/68 132/58 141/78 129/81  Pulse: 65 79 84 64  Temp: 99.1 F (37.3 C) 97.4 F (36.3 C) 97 F (36.1 C) 96.7 F (35.9 C)  TempSrc: Axillary Axillary Axillary Oral  Resp: 19 18 18 18   Height:      Weight:    102 lb 4.7 oz (46.4 kg)  SpO2: 100% 100% 100% 100%    Intake/Output Summary (Last 24 hours) at 01/14/14 0826 Last data filed at 01/13/14 2157  Gross per 24 hour  Intake 1067.17 ml  Output    100 ml  Net 967.17 ml    PULM  CTAB CV  Irr, irr GI  soft, NTND VASC  R AKA: viable stump, no bleeding  Laboratory CBC    Component Value Date/Time   WBC 7.8 01/14/2014 0352   HGB 12.0* 01/14/2014 0352   HCT 36.9* 01/14/2014 0352   PLT 185 01/14/2014 0352    BMET    Component Value Date/Time   NA 142 01/14/2014 0352   K 3.9 01/14/2014 0352   CL 105 01/14/2014 0352   CO2 25 01/14/2014 0352   GLUCOSE 112* 01/14/2014 0352   BUN 15 01/14/2014 0352   CREATININE 0.71 01/14/2014 0352   CREATININE 1.10 06/27/2011 1413   CALCIUM 8.8 01/14/2014 0352   GFRNONAA 82* 01/14/2014 0352   GFRAA >90 01/14/2014 0352    Adele Barthel, MD [facility] [place] Office: [phone] Pager: [phone]  01/14/2014, 8:26 AM

## 2014-01-14 NOTE — Progress Notes (Signed)
ANTICOAGULATION CONSULT NOTE - Initial Consult  Pharmacy Consult for LMWH Indication: atrial fibrillation  No Known Allergies  Patient Measurements: Height: 5\' 2"  (157.5 cm) Weight: 102 lb 4.7 oz (46.4 kg) IBW/kg (Calculated) : 54.6  Vital Signs: Temp: 96.7 F (35.9 C) (10/30 0502) Temp Source: Oral (10/30 0502) BP: 129/81 mmHg (10/30 0502) Pulse Rate: 64 (10/30 0502)  Labs:  Recent Labs  01/11/14 1218  01/12/14 0536 01/12/14 2023 01/13/14 0515 01/14/14 0352  HGB  --   < > 11.2* 11.9* 12.0* 12.0*  HCT  --   < > 34.3* 36.9* 36.7* 36.9*  PLT  --   < > 217 188 185 185  LABPROT 23.0*  --  16.5*  --   --   --   INR 2.02*  --  1.31  --   --   --   CREATININE  --   < > 0.90 0.95 0.91 0.71  < > = values in this interval not displayed.  Estimated Creatinine Clearance: 42.7 ml/min (by C-G formula based on Cr of 0.71).   Medical History: Past Medical History  Diagnosis Date  . Rheumatic heart disease     moderate mitral stenosis and history of CHF  . Hypertension   . Sick sinus syndrome      Paroxysmal and then constant AF; slow rate on modest AV nodal blocking agents  . Chronic low back pain   . Atrial fibrillation     Per record has Afib/Aflutter with SSS  . PVD (peripheral vascular disease)     Right superficial femoral to peroneal artery bypass graft with nonreversed saphenous vein composite  2002  . Mesenteric ischemia     SMA artery embolectomy in 2002  . Dementia     mild/H&P (12/13/2011)  . Prostate cancer   . Shortness of breath     "@ night" (12/13/2011)  . Pacemaker   . Pacemaker     Medications:  Prescriptions prior to admission  Medication Sig Dispense Refill  . acetaminophen (TYLENOL) 650 MG CR tablet Take 650 mg by mouth every 6 (six) hours as needed for pain.      Marland Kitchen amLODipine (NORVASC) 5 MG tablet Take 1 tablet (5 mg total) by mouth daily.  90 tablet  3  . calcium-vitamin D (OSCAL WITH D) 500-200 MG-UNIT per tablet Take 1 tablet by mouth 2 (two)  times daily.      Marland Kitchen docusate sodium (COLACE) 100 MG capsule Take 100 mg by mouth 2 (two) times daily.      Marland Kitchen donepezil (ARICEPT) 5 MG tablet Take 5 mg by mouth daily.      . feeding supplement, GLUCERNA SHAKE, (GLUCERNA SHAKE) LIQD Take 237 mLs by mouth at bedtime.    0  . losartan (COZAAR) 100 MG tablet Take 100 mg by mouth daily.      . methocarbamol (ROBAXIN) 500 MG tablet Take 1 tablet (500 mg total) by mouth every 6 (six) hours as needed for muscle spasms.  20 tablet  0  . mirtazapine (REMERON) 15 MG tablet Take 15 mg by mouth at bedtime.      . Multiple Vitamin (MULTIVITAMIN WITH MINERALS) TABS tablet Take 1 tablet by mouth daily.      . traMADol (ULTRAM) 50 MG tablet Take 50 mg by mouth every 6 (six) hours as needed (pain).       . TRAVATAN Z 0.004 % SOLN ophthalmic solution Place 1 drop into both eyes at bedtime.       Marland Kitchen  warfarin (COUMADIN) 5 MG tablet Take 5 mg by mouth daily. Take 1 tablet (5mg ) daily except 1/2 tablet (2.5mg  ) on Mon & Thurs      . [DISCONTINUED] HYDROcodone-acetaminophen (NORCO/VICODIN) 5-325 MG per tablet Take 1-2 tablets by mouth every 6 (six) hours as needed for moderate pain.  30 tablet  0  . olmesartan (BENICAR) 40 MG tablet Take 1 tablet (40 mg total) by mouth daily.  30 tablet  6    Assessment: 80 yo M s/p R AKA, POD # 2.  Pharmacy consulted to change VTE px dose sq heparin to full dose LMWH for afib bridge until INR > = 2.  Coumadin dose PTA was 5 mg daily except 2.5 mon on Mon/Thursday.  Admission INR was therapeutic at 2.02.   Coumadin was reversed with vitamin K 1 mg IV on 10/27 and 2U FFP on 10/27.  INR 1.31 on 10/28.  Hg stable at 12, pltc stable at 185.  No bleeding reported.  Wt 46 kg and creat cl 43 ml/min. Poor po intake noted.    Goal of Therapy:  INR 2-3 Anti-Xa level 0.6-1 units/ml 4hrs after LMWH dose given Monitor platelets by anticoagulation protocol: Yes   Plan:  -LMWH 40 mg sq q12h for full dose LMWH until INR > = 2 -coumadin per MD,  coumadin 5 mg daily and daily INR ordered for today -f/u renal fxn, cbc, and s/sx of bleeding  Eudelia Bunch, Pharm.D. [phone] 01/14/2014 8:40 AM

## 2014-01-15 NOTE — Progress Notes (Signed)
Patient was discharged back to SNF. Received patient at 1900. Pain medication was given to patient because he seemed in pain with his R AKA incision. Patient was changed and cleaned before EMS got here. Spoke to granddaughter and made her aware of his discharge.

## 2014-01-19 NOTE — Clinical Social Work Psychosocial (Addendum)
Clinical Social Work Department BRIEF PSYCHOSOCIAL ASSESSMENT 01/13/2014  Patient:  [name], [name]     Account Number:  [id number]     Admit date:  01/11/2014  Clinical Social Worker:  Elam Dutch  Date/Time:  01/13/2014 11:55 AM  Referred by:  Physician  Date Referred:  01/12/2014 Referred for  Other - See comment   Other Referral:   Return to SNF   Interview type:  Other - See comment Other interview type:   Patient- (mostly confused) and message left for son    PSYCHOSOCIAL DATA Living Status:  FACILITY Admitted from facility:  [facility] Level of care:  [place] Primary support name:  Dabney Schanz  [phone] Primary support relationship to patient:  CHILD, ADULT Degree of support available:   Unknown.  Patient is unable to verbalize level of support but gave permission for his son to be notified.    CURRENT CONCERNS Current Concerns  Other - See comment   Other Concerns:   Return to SNF    SOCIAL WORK ASSESSMENT / PLAN 80 year old male- resident of [facility]- SNF level of care admitted for R leg gangrene and need for R AKA.  CSW met with patient who was noted to be alert but mostly confused and was unable to converse very well with CSW.  Multiple messages left for patient's son Kasandra Knudsen to return CSW's calls for update on condition and to discuss d/c plan. CSW spoke to [employer], SW for [facility]. She stated that they would plan to accept patient back when medically stable and noted that as much prior advance needed to be provided so that they could request [facility] authorization.  Fl2 completed and and placed on chart for MD's signature.   Assessment/plan status:  Psychosocial Support/Ongoing Assessment of Needs Other assessment/ plan:   Information/referral to community resources:   None at this time    PATIENT'S/FAMILY'S RESPONSE TO PLAN OF CARE: Patient is alert but unable to respond to plan  of care due to confusion.  He was able to respond yes when asked if he wanted to return to [facility] when stable.  CSW is attempting to reach patient's son Kasandra Knudsen to offer support and ascertain d/c plan.

## 2014-01-19 NOTE — Progress Notes (Signed)
Ok per MD for d/c today back to [facility] via EMS.  CSW left 2 messages for patient's son Kasandra Knudsen (have been unable to contact him during patient's hospitalization).  CSW spoke to Tina Griffiths- 2nd contact person listed on his chart. She stated that she was patient's ex-daughter-in-law and that she knew how to contact [place] and have him return call to [place]. She would also notify him of patient's d/c back to SNF.  CSW did not receive a call back from son prior to d/c.  Nursing notified to call report to SNF. No further CSW needs identified. Patient remains alert but disoriented. Lorie Phenix. Pauline Good, [place]

## 2014-01-24 ENCOUNTER — Telehealth: Payer: Self-pay | Admitting: *Deleted

## 2014-01-24 NOTE — Telephone Encounter (Signed)
Wells Guiles from [facility] left VM on appt desk phone that [name] is complaining of left lower extremity pain. It is also  Discolored and swollen. Wells Guiles can be reached at [phone].   S/p AKA   Thanks!  Cira Rue called Wells Guiles at [facility] per the above message from our front desk staff. I spoke to [name] nurse, [name], and she said that his LLE is cold and blue colored, she doesn't know how long his leg has been this way. Patient is s/p Right AKA by Dr. Bridgett Larsson on 01-12-14. I informed the nurse that Billy. Delpilar needs to be transported to [facility] ED for further evaluation. She voiced understanding.

## 2014-01-27 ENCOUNTER — Encounter: Payer: Self-pay | Admitting: Vascular Surgery

## 2014-01-28 ENCOUNTER — Encounter: Payer: Medicare HMO | Admitting: Vascular Surgery

## 2014-01-28 ENCOUNTER — Ambulatory Visit (INDEPENDENT_AMBULATORY_CARE_PROVIDER_SITE_OTHER): Payer: Medicare HMO | Admitting: Vascular Surgery

## 2014-01-28 ENCOUNTER — Encounter: Payer: Self-pay | Admitting: Vascular Surgery

## 2014-01-28 VITALS — BP 94/75 | HR 67 | Temp 97.5°F | Ht 62.0 in | Wt 102.0 lb

## 2014-01-28 DIAGNOSIS — I70261 Atherosclerosis of native arteries of extremities with gangrene, right leg: Secondary | ICD-10-CM

## 2014-01-28 DIAGNOSIS — Z89611 Acquired absence of right leg above knee: Secondary | ICD-10-CM | POA: Insufficient documentation

## 2014-01-28 DIAGNOSIS — Z89612 Acquired absence of left leg above knee: Secondary | ICD-10-CM

## 2014-01-28 NOTE — Progress Notes (Signed)
    Postoperative Visit   History of Present Illness  [name] is a 80 y.o. male who presents for postoperative follow-up for: R above-the-knee amputation (Date: 01/12/14).  The patient's wounds are healing.  The patient is demented so it is difficult to determine if pain is well controlled.  The patient's current symptoms are: intermittent twitching, unknown if pt having pain during episodes.  For VQI Use Only  PRE-ADM LIVING: Nursing home  AMB STATUS: Wheelchair  Physical Examination  Filed Vitals:   01/28/14 0916  BP: 94/75  Pulse: 67  Temp: 97.5 F (36.4 C)   RLE: Incision is healing.  Staples are intact.  LLE: somewhat dusky toes with callous overlying heel, no frank gangrene, intact DF/PF  Medical Decision Making  [name] is a 80 y.o. male who presents s/p R above-the-knee ampuation. The patient's stump is healing appropriately. Staples can be removed on 28 NOV 15 or in the office the following week. In regards to the L leg, this patient is not a candidate for any revascularization due to his significant dementia.  I would only offer him a palliative L AKA in case he develops rest pain in that leg.  The patient can follow up with [place] as needed.  Adele Barthel, MD [facility] Office: [phone] Pager: [phone]  01/28/2014, 9:37 AM

## 2014-02-17 ENCOUNTER — Encounter: Payer: Self-pay | Admitting: Vascular Surgery

## 2014-02-18 ENCOUNTER — Encounter: Payer: Medicare HMO | Admitting: Vascular Surgery

## 2014-02-18 ENCOUNTER — Encounter: Payer: Self-pay | Admitting: Vascular Surgery

## 2014-02-18 ENCOUNTER — Ambulatory Visit: Payer: Medicare HMO | Admitting: Vascular Surgery

## 2014-02-18 VITALS — BP 121/67 | HR 61 | Ht 62.0 in | Wt 102.0 lb

## 2014-02-18 DIAGNOSIS — I70269 Atherosclerosis of native arteries of extremities with gangrene, unspecified extremity: Secondary | ICD-10-CM

## 2014-02-18 NOTE — Progress Notes (Signed)
    Postoperative Visit   History of Present Illness  [name] is a 80 y.o. male who presents for postoperative follow-up for: right above-the-knee amputation (Date: 01/12/14).  The patient's wounds are healed.  The patient notes pain is well controlled.  The patient's current symptoms are: none.  For VQI Use Only  PRE-ADM LIVING: Nursing home  AMB STATUS: Wheelchair  Physical Examination  Filed Vitals:   02/18/14 0854  BP: 121/67  Pulse: 61   RLE: Incision is healed.  Staples are are out.  Medical Decision Making  [name] is a 80 y.o. male who presents s/p right above-the-knee ampuation.  The patient's stump is healing appropriately with resolution of pre-operative symptoms. I discussed in depth with the patient the nature of atherosclerosis, and emphasized the importance of maximal medical management including strict control of blood pressure, blood glucose, and lipid levels, obtaining regular exercise, and cessation of smoking.  The patient is aware that without maximal medical management the underlying atherosclerotic disease process will progress, possibly leading to a more proximal amputation. The patient agrees to participate in their maximal medical care.  The patient can follow up with [place] as needed.  Adele Barthel, MD [facility] [place] Office: [phone] Pager: [phone]  02/18/2014, 9:11 AM

## 2014-02-24 ENCOUNTER — Encounter (HOSPITAL_COMMUNITY): Payer: Self-pay | Admitting: Internal Medicine

## 2014-03-09 ENCOUNTER — Telehealth: Payer: Self-pay

## 2014-03-09 NOTE — Telephone Encounter (Signed)
Phone call from [facility] requesting an appt. For the pt. Due to "edema and pain of left leg".  Returned call to [place]; spoke with nurse, Ivin Booty.  Reported the Wound Care Nurse would like an appt. for pt. "after the 1st of the year".  Reported the pt. c/o pain in left LE when touched.  Questioned about any open sores/ tissue loss;  reported pt. has a skin tear on the left LE, that was noted yesterday (12/22)  Will schedule appt. And return call to the nursing home with appt. Information.  Advised to call office if symptoms worsen, prior to his scheduled appt.  Verb. Understanding.

## 2014-03-10 NOTE — Telephone Encounter (Signed)
Spoke with Wells Guiles to schedule appointment, dpm

## 2014-03-21 ENCOUNTER — Ambulatory Visit (INDEPENDENT_AMBULATORY_CARE_PROVIDER_SITE_OTHER): Payer: Medicare HMO | Admitting: Internal Medicine

## 2014-03-21 ENCOUNTER — Encounter: Payer: Self-pay | Admitting: Internal Medicine

## 2014-03-21 VITALS — BP 118/68 | HR 48 | Ht 68.0 in | Wt 100.0 lb

## 2014-03-21 DIAGNOSIS — I495 Sick sinus syndrome: Secondary | ICD-10-CM

## 2014-03-21 DIAGNOSIS — I482 Chronic atrial fibrillation, unspecified: Secondary | ICD-10-CM

## 2014-03-21 DIAGNOSIS — Z950 Presence of cardiac pacemaker: Secondary | ICD-10-CM

## 2014-03-21 DIAGNOSIS — F0390 Unspecified dementia without behavioral disturbance: Secondary | ICD-10-CM

## 2014-03-21 LAB — MDC_IDC_ENUM_SESS_TYPE_INCLINIC
Date Time Interrogation Session: 20160104111819
Lead Channel Impedance Value: 468 Ohm
Lead Channel Pacing Threshold Amplitude: 0.5 V
Lead Channel Pacing Threshold Pulse Width: 0.4 ms
Lead Channel Sensing Intrinsic Amplitude: 15.67 mV
MDC IDC MSMT BATTERY IMPEDANCE: 280 Ohm
MDC IDC MSMT BATTERY REMAINING LONGEVITY: 100 mo
MDC IDC MSMT BATTERY VOLTAGE: 2.79 V
MDC IDC MSMT LEADCHNL RA IMPEDANCE VALUE: 0 Ohm
MDC IDC SET LEADCHNL RV PACING AMPLITUDE: 2 V
MDC IDC SET LEADCHNL RV PACING PULSEWIDTH: 0.4 ms
MDC IDC SET LEADCHNL RV SENSING SENSITIVITY: 4 mV
MDC IDC STAT BRADY RV PERCENT PACED: 89 %

## 2014-03-21 NOTE — Patient Instructions (Addendum)
Your physician wants you to follow-up in: 1 year with Dr. Knox Saliva will receive a reminder letter in the mail two months in advance. If you don't receive a letter, please call our office to schedule the follow-up appointment.  Your physician recommends that you schedule a follow-up appointment in: 6 months with [place] physician recommends that you continue on your current medications as directed. Please refer to the Current Medication list given to you today.   Thank you for choosing [facility]!!

## 2014-03-21 NOTE — Assessment & Plan Note (Signed)
His ventricular rate is controlled. No change in meds.

## 2014-03-21 NOTE — Progress Notes (Signed)
HPI [name] returns today for followup. He is a pleasant, demented 81 yo man with HTN, peripheral vascular disease, chronic atrial fib, sick sinus syndrome, s/p PPM insertion. In the interim he has had a right AKA. He has severe pain in his left leg. He has lost weight. He is not thriving. No Known Allergies   Current Outpatient Prescriptions  Medication Sig Dispense Refill  . acetaminophen (TYLENOL) 650 MG CR tablet Take 650 mg by mouth every 6 (six) hours as needed for pain.    Marland Kitchen amLODipine (NORVASC) 5 MG tablet Take 1 tablet (5 mg total) by mouth daily. 90 tablet 3  . calcium-vitamin D (OSCAL WITH D) 500-200 MG-UNIT per tablet Take 1 tablet by mouth 2 (two) times daily.    Marland Kitchen COUMADIN 2.5 MG tablet     . docusate sodium (COLACE) 100 MG capsule Take 100 mg by mouth 2 (two) times daily.    Marland Kitchen donepezil (ARICEPT) 5 MG tablet Take 5 mg by mouth daily.    . feeding supplement, GLUCERNA SHAKE, (GLUCERNA SHAKE) LIQD Take 237 mLs by mouth at bedtime.  0  . HYDROcodone-acetaminophen (NORCO/VICODIN) 5-325 MG per tablet Take 1-2 tablets by mouth every 6 (six) hours as needed for moderate pain. 30 tablet 0  . losartan (COZAAR) 100 MG tablet Take 100 mg by mouth daily.    . methocarbamol (ROBAXIN) 500 MG tablet Take 1 tablet (500 mg total) by mouth every 6 (six) hours as needed for muscle spasms. 20 tablet 0  . mirtazapine (REMERON) 15 MG tablet Take 15 mg by mouth at bedtime.    . Multiple Vitamin (MULTIVITAMIN WITH MINERALS) TABS tablet Take 1 tablet by mouth daily.    . polyethylene glycol powder (GLYCOLAX/MIRALAX) powder     . traMADol (ULTRAM) 50 MG tablet Take 50 mg by mouth every 6 (six) hours as needed (pain).     . TRAVATAN Z 0.004 % SOLN ophthalmic solution Place 1 drop into both eyes at bedtime.     Marland Kitchen warfarin (COUMADIN) 5 MG tablet Take 5 mg by mouth daily. Take 1 tablet (5mg ) daily except 1/2 tablet (2.5mg  ) on Mon & Thurs    . zinc gluconate 50 MG tablet Take 50 mg by mouth daily.    Marland Kitchen  olmesartan (BENICAR) 40 MG tablet Take 1 tablet (40 mg total) by mouth daily. 30 tablet 6   No current facility-administered medications for this visit.     Past Medical History  Diagnosis Date  . Rheumatic heart disease     moderate mitral stenosis and history of CHF  . Hypertension   . Sick sinus syndrome      Paroxysmal and then constant AF; slow rate on modest AV nodal blocking agents  . Chronic low back pain   . Atrial fibrillation     Per record has Afib/Aflutter with SSS  . PVD (peripheral vascular disease)     Right superficial femoral to peroneal artery bypass graft with nonreversed saphenous vein composite  2002  . Mesenteric ischemia     SMA artery embolectomy in 2002  . Dementia     mild/H&P (12/13/2011)  . Prostate cancer   . Shortness of breath     "@ night" (12/13/2011)  . Pacemaker   . Pacemaker     ROS:   All systems reviewed and negative except as noted in the HPI.   Past Surgical History  Procedure Laterality Date  . Transurethral resection of prostate    . Embolectomy  2002    SMA artery  . Femoral-peroneal bypass graft  2002    right superficial (12/13/2011)  . Orif hip fracture Right 09/30/2013    Procedure: OPEN TREATMENT INTERNAL FIXATION OF RIGHT HIP FRACTURE;  Surgeon: Sanjuana Kava, MD;  Location: [facility];  Service: Orthopedics;  Laterality: Right;  . Amputation Right 01/12/2014    Procedure: RIGHT  ABOVE KNEE AMPUTATION;  Surgeon: Conrad , MD;  Location: [facility];  Service: Vascular;  Laterality: Right;  . Permanent pacemaker insertion N/A 12/16/2011    Procedure: PERMANENT PACEMAKER INSERTION;  Surgeon: Deboraha Sprang, MD;  Location: [facility] CATH LAB;  Service: Cardiovascular;  Laterality: N/A;     No family history on file.   History   Social History  . Marital Status: Widowed    Spouse Name: N/A    Number of Children: N/A  . Years of Education: N/A   Occupational History  . retired     yard work Customer service manager   Social History Main  Topics  . Smoking status: Never Smoker   . Smokeless tobacco: Never Used  . Alcohol Use: No  . Drug Use: No  . Sexual Activity: No   Other Topics Concern  . Not on file   Social History Narrative     BP 118/68 mmHg  Pulse 48  Ht 5\' 8"  (1.727 m)  Wt 100 lb (45.36 kg)  BMI 15.21 kg/m2  SpO2 98%  Physical Exam:  cachectic appearing elderly man, NAD HEENT: Unremarkable Neck:  7 cm JVD, no thyromegally Back:  No CVA tenderness Lungs:  Clear with no wheezes, rales, or rhonchi. HEART:  Regular rate rhythm, no murmurs, no rubs, no clicks, widely split S2.  Abd:  soft, positive bowel sounds, no organomegally, no rebound, no guarding Ext:  Right AKA, left leg bandaged and very tender to palpation and in a boot. Skin:  No rashes no nodules Neuro:  CN II through XII intact, motor grossly intact   DEVICE  Normal device function.  See PaceArt for details.   Assess/Plan:

## 2014-03-21 NOTE — Assessment & Plan Note (Signed)
He seems worse today. Prognosis is poor.

## 2014-03-21 NOTE — Assessment & Plan Note (Signed)
His medtronic VVIR PM is working normally. No change in programming.

## 2014-03-22 ENCOUNTER — Encounter: Payer: Self-pay | Admitting: Internal Medicine

## 2014-03-22 LAB — PACEMAKER DEVICE OBSERVATION

## 2014-03-24 ENCOUNTER — Encounter: Payer: Self-pay | Admitting: Family

## 2014-03-25 ENCOUNTER — Encounter: Payer: Self-pay | Admitting: Family

## 2014-03-25 ENCOUNTER — Other Ambulatory Visit: Payer: Self-pay

## 2014-03-25 ENCOUNTER — Ambulatory Visit (INDEPENDENT_AMBULATORY_CARE_PROVIDER_SITE_OTHER): Payer: Medicare HMO | Admitting: Family

## 2014-03-25 VITALS — BP 112/74 | HR 63 | Resp 16 | Ht 64.0 in | Wt 103.0 lb

## 2014-03-25 DIAGNOSIS — I70269 Atherosclerosis of native arteries of extremities with gangrene, unspecified extremity: Secondary | ICD-10-CM

## 2014-03-25 DIAGNOSIS — Z89611 Acquired absence of right leg above knee: Secondary | ICD-10-CM

## 2014-03-25 NOTE — Patient Instructions (Addendum)
Peripheral Vascular Disease Peripheral Vascular Disease (PVD), also called Peripheral Arterial Disease (PAD), is a circulation problem caused by cholesterol (atherosclerotic plaque) deposits in the arteries. PVD commonly occurs in the lower extremities (legs) but it can occur in other areas of the body, such as your arms. The cholesterol buildup in the arteries reduces blood flow which can cause pain and other serious problems. The presence of PVD can place a person at risk for Coronary Artery Disease (CAD).  CAUSES  Causes of PVD can be many. It is usually associated with more than one risk factor such as:   High Cholesterol.  Smoking.  Diabetes.  Lack of exercise or inactivity.  High blood pressure (hypertension).  Obesity.  Family history. SYMPTOMS   When the lower extremities are affected, patients with PVD may experience:  Leg pain with exertion or physical activity. This is called INTERMITTENT CLAUDICATION. This may present as cramping or numbness with physical activity. The location of the pain is associated with the level of blockage. For example, blockage at the abdominal level (distal abdominal aorta) may result in buttock or hip pain. Lower leg arterial blockage may result in calf pain.  As PVD becomes more severe, pain can develop with less physical activity.  In people with severe PVD, leg pain may occur at rest.  Other PVD signs and symptoms:  Leg numbness or weakness.  Coldness in the affected leg or foot, especially when compared to the other leg.  A change in leg color.  Patients with significant PVD are more prone to ulcers or sores on toes, feet or legs. These may take longer to heal or may reoccur. The ulcers or sores can become infected.  If signs and symptoms of PVD are ignored, gangrene may occur. This can result in the loss of toes or loss of an entire limb.  Not all leg pain is related to PVD. Other medical conditions can cause leg pain such  as:  Blood clots (embolism) or Deep Vein Thrombosis.  Inflammation of the blood vessels (vasculitis).  Spinal stenosis. DIAGNOSIS  Diagnosis of PVD can involve several different types of tests. These can include:  Pulse Volume Recording Method (PVR). This test is simple, painless and does not involve the use of X-rays. PVR involves measuring and comparing the blood pressure in the arms and legs. An ABI (Ankle-Brachial Index) is calculated. The normal ratio of blood pressures is 1. As this number becomes smaller, it indicates more severe disease.  < 0.95 - indicates significant narrowing in one or more leg vessels.  <0.8 - there will usually be pain in the foot, leg or buttock with exercise.  <0.4 - will usually have pain in the legs at rest.  <0.25 - usually indicates limb threatening PVD.  Doppler detection of pulses in the legs. This test is painless and checks to see if you have a pulses in your legs/feet.  A dye or contrast material (a substance that highlights the blood vessels so they show up on x-ray) may be given to help your caregiver better see the arteries for the following tests. The dye is eliminated from your body by the kidney's. Your caregiver may order blood work to check your kidney function and other laboratory values before the following tests are performed:  Magnetic Resonance Angiography (MRA). An MRA is a picture study of the blood vessels and arteries. The MRA machine uses a large magnet to produce images of the blood vessels.  Computed Tomography Angiography (CTA). A CTA   is a specialized x-ray that looks at how the blood flows in your blood vessels. An IV may be inserted into your arm so contrast dye can be injected.  Angiogram. Is a procedure that uses x-rays to look at your blood vessels. This procedure is minimally invasive, meaning a small incision (cut) is made in your groin. A small tube (catheter) is then inserted into the artery of your groin. The catheter  is guided to the blood vessel or artery your caregiver wants to examine. Contrast dye is injected into the catheter. X-rays are then taken of the blood vessel or artery. After the images are obtained, the catheter is taken out. TREATMENT  Treatment of PVD involves many interventions which may include:  Lifestyle changes:  Quitting smoking.  Exercise.  Following a low fat, low cholesterol diet.  Control of diabetes.  Foot care is very important to the PVD patient. Good foot care can help prevent infection.  Medication:  Cholesterol-lowering medicine.  Blood pressure medicine.  Anti-platelet drugs.  Certain medicines may reduce symptoms of Intermittent Claudication.  Interventional/Surgical options:  Angioplasty. An Angioplasty is a procedure that inflates a balloon in the blocked artery. This opens the blocked artery to improve blood flow.  Stent Implant. A wire mesh tube (stent) is placed in the artery. The stent expands and stays in place, allowing the artery to remain open.  Peripheral Bypass Surgery. This is a surgical procedure that reroutes the blood around a blocked artery to help improve blood flow. This type of procedure may be performed if Angioplasty or stent implants are not an option. SEEK IMMEDIATE MEDICAL CARE IF:   You develop pain or numbness in your arms or legs.  Your arm or leg turns cold, becomes blue in color.  You develop redness, warmth, swelling and pain in your arms or legs. MAKE SURE YOU:   Understand these instructions.  Will watch your condition.  Will get help right away if you are not doing well or get worse. Document Released: 04/11/2004 Document Revised: 05/27/2011 Document Reviewed: 03/08/2008 [facility] Patient Information 2015 [place], [place]. This information is not intended to replace advice given to you by your health care provider. Make sure you discuss any questions you have with your health care provider.   Amputation Many new  amputations occur each year. The most common causes of amputation of the lower extremity (the hip down) are:  Disease.  Injury caused in an accidents or wars (trauma).  Birth defects.  Lumps (tumors) that are cancer. Upper extremity amputation is usually the result of trauma or birth defect, with disease being a less common cause. COMMON PROBLEMS After an amputation a number of issues need to be considered. Getting around and self-care are early problems that must be dealt with. A complete rehabilitation program will help the amputee recover mobility. A team approach of caregivers helps the most. Caregivers that can provide a well rounded program include:   Physicians.  Therapists.  Nurses.  Social workers.  Psychologists. Usually there are problems with body image and coping with lifestyle changes. A grieving period similar to dealing with a death in the family is common after an amputation. Talking to a trained professional with experience in treating people with similar problems can be very helpful. When returning to a previous lifestyle, questions about sexuality can arise. Many of these uncertainties are normal. These can be discussed with your psychologist or rehabilitation specialist. REHABILITATION AND RETURN TO WORK AND ACTIVITIES Returning to recreational activities and employment  are part of recovery. Many times, changes to recreation equipment can allow return to a sport or hobby. A device that substitutes the missing part of the body is called a prosthetic. Many prosthetic manufacturers produce components designed for sports. Be sure to discuss all of your leisure interests with your prosthetist. This is the person who helps provide you with custom made replacement limbs. Your physician will also help to select a prosthetic that will meet your needs. Employers will vary in their willingness to change a work environment in order to help people with disabilities. Your therapists  can perform job site evaluations. Your therapist can then make recommendations to help with your work area. Some amputees will not be able to return to previous jobs. Your local Office of Vocational Rehabilitation can assist you in job retraining.  Once you are past the initial rehabilitation stage you will have ongoing contact with caregivers and a prosthetist. You need to work closely with them in making decisions about your prosthetic device. PROGNOSIS  Amputation should not end your joy of life. There are people with limb loss in nearly all walks of life. They are in a wide variety of professions. They participate in nearly all sports. Ask your caregivers about support groups and sports organizations in your area. They can help you with referral to organizations that will be helpful to you. Document Released: 11/24/2001 Document Revised: 05/27/2011 Document Reviewed: 01/19/2007 [facility] Patient Information 2015 [place], [place]. This information is not intended to replace advice given to you by your health care provider. Make sure you discuss any questions you have with your health care provider.

## 2014-03-25 NOTE — Progress Notes (Signed)
[facility] [place] HISTORY AND PHYSICAL -PAD  History of Present Illness [name] is a 81 y.o. male patient of Dr. Bridgett Larsson who is s/p right above-the-knee amputation (Date: 01/12/14).  He returns today with his son with nursing facility reports of worsening pain and small ulcers left shin, severe pain in left foot. He is non ambulatory, is a resident of a SNF.  Pt Diabetic: No Pt smoker: non-smoker    Past Medical History  Diagnosis Date  . Rheumatic heart disease     moderate mitral stenosis and history of CHF  . Hypertension   . Sick sinus syndrome      Paroxysmal and then constant AF; slow rate on modest AV nodal blocking agents  . Chronic low back pain   . Atrial fibrillation     Per record has Afib/Aflutter with SSS  . PVD (peripheral vascular disease)     Right superficial femoral to peroneal artery bypass graft with nonreversed saphenous vein composite  2002  . Mesenteric ischemia     SMA artery embolectomy in 2002  . Dementia     mild/H&P (12/13/2011)  . Prostate cancer   . Shortness of breath     "@ night" (12/13/2011)  . Pacemaker   . Pacemaker     Social History History  Substance Use Topics  . Smoking status: Never Smoker   . Smokeless tobacco: Never Used  . Alcohol Use: No    Family History No family history on file.  Past Surgical History  Procedure Laterality Date  . Transurethral resection of prostate    . Embolectomy  2002    SMA artery  . Femoral-peroneal bypass graft  2002    right superficial (12/13/2011)  . Orif hip fracture Right 09/30/2013    Procedure: OPEN TREATMENT INTERNAL FIXATION OF RIGHT HIP FRACTURE;  Surgeon: Sanjuana Kava, MD;  Location: [facility];  Service: Orthopedics;  Laterality: Right;  . Amputation Right 01/12/2014    Procedure: RIGHT  ABOVE KNEE AMPUTATION;  Surgeon: Conrad [place], MD;  Location: [place];  Service: Vascular;  Laterality: Right;  . Permanent pacemaker insertion N/A 12/16/2011   Procedure: PERMANENT PACEMAKER INSERTION;  Surgeon: Deboraha Sprang, MD;  Location: [facility] CATH LAB;  Service: Cardiovascular;  Laterality: N/A;    No Known Allergies  Current Outpatient Prescriptions  Medication Sig Dispense Refill  . acetaminophen (TYLENOL) 650 MG CR tablet Take 650 mg by mouth every 6 (six) hours as needed for pain.    Marland Kitchen amLODipine (NORVASC) 5 MG tablet Take 1 tablet (5 mg total) by mouth daily. 90 tablet 3  . calcium-vitamin D (OSCAL WITH D) 500-200 MG-UNIT per tablet Take 1 tablet by mouth 2 (two) times daily.    Marland Kitchen COUMADIN 2.5 MG tablet     . docusate sodium (COLACE) 100 MG capsule Take 100 mg by mouth 2 (two) times daily.    Marland Kitchen donepezil (ARICEPT) 5 MG tablet Take 5 mg by mouth daily.    . feeding supplement, GLUCERNA SHAKE, (GLUCERNA SHAKE) LIQD Take 237 mLs by mouth at bedtime.  0  . HYDROcodone-acetaminophen (NORCO/VICODIN) 5-325 MG per tablet Take 1-2 tablets by mouth every 6 (six) hours as needed for moderate pain. 30 tablet 0  . losartan (COZAAR) 100 MG tablet Take 100 mg by mouth daily.    . methocarbamol (ROBAXIN) 500 MG tablet Take 1 tablet (500 mg total) by mouth every 6 (six) hours as needed for muscle spasms. 20 tablet 0  .  mirtazapine (REMERON) 15 MG tablet Take 15 mg by mouth at bedtime.    . Multiple Vitamin (MULTIVITAMIN WITH MINERALS) TABS tablet Take 1 tablet by mouth daily.    . polyethylene glycol powder (GLYCOLAX/MIRALAX) powder     . traMADol (ULTRAM) 50 MG tablet Take 50 mg by mouth every 6 (six) hours as needed (pain).     . TRAVATAN Z 0.004 % SOLN ophthalmic solution Place 1 drop into both eyes at bedtime.     Marland Kitchen warfarin (COUMADIN) 5 MG tablet Take 5 mg by mouth daily. Take 1 tablet (5mg ) daily except 1/2 tablet (2.5mg  ) on Mon & Thurs    . zinc gluconate 50 MG tablet Take 50 mg by mouth daily.    Marland Kitchen olmesartan (BENICAR) 40 MG tablet Take 1 tablet (40 mg total) by mouth daily. 30 tablet 6   No current facility-administered medications for this  visit.    ROS: See HPI for pertinent positives and negatives.   Physical Examination  Filed Vitals:   03/25/14 0915  BP: 112/74  Pulse: 63  Resp: 16  Height: 5\' 4"  (1.626 m)  Weight: 103 lb (46.72 kg)   Body mass index is 17.67 kg/(m^2).  General: A&O x 2, WDWN, in w/c, moaning, painful to light touch at left foot and lower leg. Painful to touch at right groin. Gait: in w/c Eyes: no gross abnormalities  Pulmonary: CTAB, without wheezes , rales or rhonchi. Cardiac: regular Rythm , without detected murmur, pacemaker palpated subcut. At left chest.         Carotid Bruits Right Left   Negative Negative  Aorta is not palpable.                         VASCULAR EXAM: Extremities with ischemic changes: left foot is cool and dark, 1+ non pitting edema, gangrenous left 5 toes; with 0.5 cm shallow ulcer at left mid tibial area. Severe pain to light touch of left foot, painful to touch left lower leg also. Right healed AKA.                                                                                                          LE Pulses Right Left       FEMORAL  not palpable, tender to palpation, no swelling or wounds present  2+ palpable        POPLITEAL  AKA  Not palpable       POSTERIOR TIBIAL  AKA   non-Dopplerable         DORSALIS PEDIS      ANTERIOR TIBIAL AKA  non-Dopplerable     Abdomen: soft, NT, no palpable masses. Skin: no rashes, see extremities Musculoskeletal: no muscle wasting or atrophy. Right AKA.  Neurologic: A&O X 2; Appropriate Affect ; SENSATION: normal; MOTOR FUNCTION:  moving all extremities equally, motor strength 3/5 throughout. Speech is curt/normal, responds appropriately to questions.    ASSESSMENT: [name] is a 81 y.o. male who is s/p right above-the-knee amputation (Date:  01/12/14).  He returns today with his son with nursing facility reports of worsening pain and small ulcers left shin, severe pain in left foot. He is non ambulatory,  is a resident of a SNF. Gangrene of left foot, severe pain of ischemia of foot. Dr. Bridgett Larsson spoke with pt and son, examined pt. Son states he is pt's POA and states he agrees to proceed with left AKA for his father.   PLAN:  Schedule left AKA , pt must first stay off coumadin for 5 days, Dr. Bridgett Larsson.  The patient was given information about PAD including signs, symptoms, treatment, what symptoms should prompt the patient to seek immediate medical care, and risk reduction measures to take.  Clemon Chambers, RN, MSN, FNP-C [facility] Phone: [phone]  Clinic MD: Bridgett Larsson  03/25/2014 9:23 AM

## 2014-03-29 ENCOUNTER — Encounter (HOSPITAL_COMMUNITY): Payer: Self-pay | Admitting: *Deleted

## 2014-03-29 MED ORDER — SODIUM CHLORIDE 0.9 % IV SOLN: INTRAVENOUS | Status: DC

## 2014-03-29 MED ORDER — CHLORHEXIDINE GLUCONATE CLOTH 2 % EX PADS: 6.0000 | MEDICATED_PAD | Freq: Once | CUTANEOUS | Status: DC

## 2014-03-29 MED ORDER — DEXTROSE 5 % IV SOLN
1.5000 g | INTRAVENOUS | Status: AC
  Administered 2014-03-30: 1.5 g via INTRAVENOUS
  Filled 2014-03-29: qty 1.5

## 2014-03-29 NOTE — Progress Notes (Signed)
Spoke with Steffanie Dunn, RN at [facility] for pre-op call. She verified meds, allergies and history. Given pre-op instructions and voiced understanding. Pt will be transported here by [facility] transportation, family will be meeting him here. Steffanie Dunn states family has been informed of time of arrival for pt. She also wanted [place] to be aware that pt has a tendency to eat his stool. Pt has dementia.

## 2014-03-30 ENCOUNTER — Encounter (HOSPITAL_COMMUNITY)
Admission: RE | Disposition: A | Discharge status: Skilled Nursing Facility | Payer: Self-pay | Source: Ambulatory Visit | Attending: Vascular Surgery

## 2014-03-30 ENCOUNTER — Inpatient Hospital Stay (HOSPITAL_COMMUNITY): Payer: Medicare HMO | Admitting: Anesthesiology

## 2014-03-30 ENCOUNTER — Telehealth: Payer: Self-pay | Admitting: Vascular Surgery

## 2014-03-30 ENCOUNTER — Encounter (HOSPITAL_COMMUNITY): Payer: Self-pay | Admitting: Anesthesiology

## 2014-03-30 ENCOUNTER — Inpatient Hospital Stay (HOSPITAL_COMMUNITY)
Admission: RE | Admit: 2014-03-30 | Discharge: 2014-04-01 | DRG: 474 | Disposition: A | Discharge status: Skilled Nursing Facility | Payer: Medicare HMO | Source: Ambulatory Visit | Attending: Vascular Surgery | Admitting: Vascular Surgery

## 2014-03-30 DIAGNOSIS — Z950 Presence of cardiac pacemaker: Secondary | ICD-10-CM

## 2014-03-30 DIAGNOSIS — I70248 Atherosclerosis of native arteries of left leg with ulceration of other part of lower left leg: Secondary | ICD-10-CM | POA: Diagnosis present

## 2014-03-30 DIAGNOSIS — F0280 Dementia in other diseases classified elsewhere without behavioral disturbance: Secondary | ICD-10-CM | POA: Diagnosis present

## 2014-03-30 DIAGNOSIS — M79606 Pain in leg, unspecified: Secondary | ICD-10-CM

## 2014-03-30 DIAGNOSIS — E43 Unspecified severe protein-calorie malnutrition: Secondary | ICD-10-CM | POA: Diagnosis present

## 2014-03-30 DIAGNOSIS — G8929 Other chronic pain: Secondary | ICD-10-CM | POA: Diagnosis present

## 2014-03-30 DIAGNOSIS — G309 Alzheimer's disease, unspecified: Secondary | ICD-10-CM | POA: Diagnosis present

## 2014-03-30 DIAGNOSIS — Z8546 Personal history of malignant neoplasm of prostate: Secondary | ICD-10-CM

## 2014-03-30 DIAGNOSIS — I739 Peripheral vascular disease, unspecified: Secondary | ICD-10-CM

## 2014-03-30 DIAGNOSIS — Z681 Body mass index (BMI) 19 or less, adult: Secondary | ICD-10-CM

## 2014-03-30 DIAGNOSIS — I70422 Atherosclerosis of autologous vein bypass graft(s) of the extremities with rest pain, left leg: Secondary | ICD-10-CM

## 2014-03-30 DIAGNOSIS — Z89611 Acquired absence of right leg above knee: Secondary | ICD-10-CM

## 2014-03-30 DIAGNOSIS — I724 Aneurysm of artery of lower extremity: Secondary | ICD-10-CM | POA: Diagnosis present

## 2014-03-30 DIAGNOSIS — I10 Essential (primary) hypertension: Secondary | ICD-10-CM | POA: Diagnosis present

## 2014-03-30 DIAGNOSIS — E785 Hyperlipidemia, unspecified: Secondary | ICD-10-CM | POA: Diagnosis present

## 2014-03-30 DIAGNOSIS — L97829 Non-pressure chronic ulcer of other part of left lower leg with unspecified severity: Secondary | ICD-10-CM | POA: Diagnosis present

## 2014-03-30 DIAGNOSIS — I70249 Atherosclerosis of native arteries of left leg with ulceration of unspecified site: Secondary | ICD-10-CM | POA: Diagnosis present

## 2014-03-30 DIAGNOSIS — M6289 Other specified disorders of muscle: Secondary | ICD-10-CM | POA: Diagnosis present

## 2014-03-30 DIAGNOSIS — M79605 Pain in left leg: Secondary | ICD-10-CM | POA: Diagnosis present

## 2014-03-30 DIAGNOSIS — I4891 Unspecified atrial fibrillation: Secondary | ICD-10-CM | POA: Diagnosis present

## 2014-03-30 DIAGNOSIS — I099 Rheumatic heart disease, unspecified: Secondary | ICD-10-CM | POA: Diagnosis present

## 2014-03-30 DIAGNOSIS — M545 Low back pain: Secondary | ICD-10-CM | POA: Diagnosis present

## 2014-03-30 DIAGNOSIS — I509 Heart failure, unspecified: Secondary | ICD-10-CM | POA: Diagnosis present

## 2014-03-30 HISTORY — PX: AMPUTATION: SHX166

## 2014-03-30 LAB — POCT I-STAT EG7
Acid-Base Excess: 1 mmol/L (ref 0.0–2.0)
Bicarbonate: 26.7 mEq/L — ABNORMAL HIGH (ref 20.0–24.0)
Calcium, Ion: 1.25 mmol/L (ref 1.13–1.30)
HEMATOCRIT: 32 % — AB (ref 39.0–52.0)
Hemoglobin: 10.9 g/dL — ABNORMAL LOW (ref 13.0–17.0)
O2 Saturation: 93 %
PCO2 VEN: 45.1 mmHg (ref 45.0–50.0)
Potassium: 4.7 mmol/L (ref 3.5–5.1)
Sodium: 141 mmol/L (ref 135–145)
TCO2: 28 mmol/L (ref 0–100)
pH, Ven: 7.38 — ABNORMAL HIGH (ref 7.250–7.300)
pO2, Ven: 70 mmHg — ABNORMAL HIGH (ref 30.0–45.0)

## 2014-03-30 LAB — COMPREHENSIVE METABOLIC PANEL
ALK PHOS: 90 U/L (ref 39–117)
ALT: 17 U/L (ref 0–53)
AST: 32 U/L (ref 0–37)
Albumin: 2.9 g/dL — ABNORMAL LOW (ref 3.5–5.2)
Anion gap: 9 (ref 5–15)
BUN: 24 mg/dL — ABNORMAL HIGH (ref 6–23)
CALCIUM: 9 mg/dL (ref 8.4–10.5)
CO2: 25 mmol/L (ref 19–32)
Chloride: 105 mEq/L (ref 96–112)
Creatinine, Ser: 1.07 mg/dL (ref 0.50–1.35)
GFR calc Af Amer: 70 mL/min — ABNORMAL LOW (ref >90)
GFR calc non Af Amer: 60 mL/min — ABNORMAL LOW (ref >90)
Glucose, Bld: 91 mg/dL (ref 70–99)
POTASSIUM: 4.7 mmol/L (ref 3.5–5.1)
SODIUM: 139 mmol/L (ref 135–145)
Total Bilirubin: 0.6 mg/dL (ref 0.3–1.2)
Total Protein: 6.8 g/dL (ref 6.0–8.3)

## 2014-03-30 LAB — CBC
HCT: 35.5 % — ABNORMAL LOW (ref 39.0–52.0)
HEMOGLOBIN: 11.6 g/dL — AB (ref 13.0–17.0)
MCH: 27.4 pg (ref 26.0–34.0)
MCHC: 32.7 g/dL (ref 30.0–36.0)
MCV: 83.7 fL (ref 78.0–100.0)
PLATELETS: 236 10*3/uL (ref 150–400)
RBC: 4.24 MIL/uL (ref 4.22–5.81)
RDW: 16.2 % — AB (ref 11.5–15.5)
WBC: 6.8 10*3/uL (ref 4.0–10.5)

## 2014-03-30 LAB — APTT: aPTT: 37 seconds (ref 24–37)

## 2014-03-30 LAB — PROTIME-INR
INR: 1.11 (ref 0.00–1.49)
Prothrombin Time: 14.4 seconds (ref 11.6–15.2)

## 2014-03-30 SURGERY — AMPUTATION, ABOVE KNEE
Anesthesia: General | Site: Leg Upper | Laterality: Left

## 2014-03-30 MED ORDER — ACETAMINOPHEN 325 MG PO TABS: 650.0000 mg | ORAL_TABLET | Freq: Four times a day (QID) | ORAL | Status: DC | PRN

## 2014-03-30 MED ORDER — FENTANYL CITRATE 0.05 MG/ML IJ SOLN
INTRAMUSCULAR | Status: AC
  Filled 2014-03-30: qty 5

## 2014-03-30 MED ORDER — DONEPEZIL HCL 5 MG PO TABS
5.0000 mg | ORAL_TABLET | Freq: Every day | ORAL | Status: DC
  Administered 2014-03-30 – 2014-04-01 (×3): 5 mg via ORAL
  Filled 2014-03-30 (×3): qty 1

## 2014-03-30 MED ORDER — ALUM & MAG HYDROXIDE-SIMETH 200-200-20 MG/5ML PO SUSP: 15.0000 mL | ORAL | Status: DC | PRN

## 2014-03-30 MED ORDER — DEXTROSE 5 % IV SOLN
1.5000 g | Freq: Two times a day (BID) | INTRAVENOUS | Status: AC
  Administered 2014-03-30 – 2014-03-31 (×2): 1.5 g via INTRAVENOUS
  Filled 2014-03-30 (×2): qty 1.5

## 2014-03-30 MED ORDER — PHENOL 1.4 % MT LIQD
1.0000 | OROMUCOSAL | Status: DC | PRN
  Filled 2014-03-30: qty 177

## 2014-03-30 MED ORDER — HYDROMORPHONE HCL 1 MG/ML IJ SOLN
INTRAMUSCULAR | Status: AC
  Filled 2014-03-30: qty 1

## 2014-03-30 MED ORDER — PHENYLEPHRINE HCL 10 MG/ML IJ SOLN
10.0000 mg | INTRAMUSCULAR | Status: DC | PRN
  Administered 2014-03-30: 25 ug/min via INTRAVENOUS

## 2014-03-30 MED ORDER — LORATADINE 10 MG PO TABS
10.0000 mg | ORAL_TABLET | Freq: Every day | ORAL | Status: DC
  Administered 2014-03-31 – 2014-04-01 (×2): 10 mg via ORAL
  Filled 2014-03-30 (×2): qty 1

## 2014-03-30 MED ORDER — MORPHINE SULFATE 2 MG/ML IJ SOLN
2.0000 mg | INTRAMUSCULAR | Status: DC | PRN
  Administered 2014-03-30 – 2014-04-01 (×7): 2 mg via INTRAVENOUS
  Filled 2014-03-30 (×8): qty 1

## 2014-03-30 MED ORDER — WHITE PETROLATUM GEL
Status: DC | PRN
  Filled 2014-03-30: qty 28.35

## 2014-03-30 MED ORDER — HYDROCODONE-ACETAMINOPHEN 5-325 MG PO TABS
1.0000 | ORAL_TABLET | Freq: Four times a day (QID) | ORAL | Status: DC | PRN
  Administered 2014-03-31 – 2014-04-01 (×2): 2 via ORAL
  Filled 2014-03-30 (×2): qty 2

## 2014-03-30 MED ORDER — HYDROMORPHONE HCL 1 MG/ML IJ SOLN
0.2500 mg | INTRAMUSCULAR | Status: DC | PRN
  Administered 2014-03-30 (×4): 0.25 mg via INTRAVENOUS

## 2014-03-30 MED ORDER — METOPROLOL TARTRATE 1 MG/ML IV SOLN
2.0000 mg | INTRAVENOUS | Status: DC | PRN
  Filled 2014-03-30: qty 5

## 2014-03-30 MED ORDER — GLUCERNA SHAKE PO LIQD
237.0000 mL | Freq: Every day | ORAL | Status: DC
  Administered 2014-03-30 – 2014-03-31 (×2): 237 mL via ORAL

## 2014-03-30 MED ORDER — LABETALOL HCL 5 MG/ML IV SOLN
10.0000 mg | INTRAVENOUS | Status: DC | PRN
  Filled 2014-03-30: qty 4

## 2014-03-30 MED ORDER — LACTATED RINGERS IV SOLN
INTRAVENOUS | Status: DC | PRN
  Administered 2014-03-30: 08:00:00 via INTRAVENOUS

## 2014-03-30 MED ORDER — PHENYLEPHRINE HCL 10 MG/ML IJ SOLN
INTRAMUSCULAR | Status: DC | PRN
  Administered 2014-03-30: 80 ug via INTRAVENOUS

## 2014-03-30 MED ORDER — PANTOPRAZOLE SODIUM 40 MG PO TBEC
40.0000 mg | DELAYED_RELEASE_TABLET | Freq: Every day | ORAL | Status: DC
  Administered 2014-03-31 – 2014-04-01 (×2): 40 mg via ORAL
  Filled 2014-03-30 (×2): qty 1

## 2014-03-30 MED ORDER — METHOCARBAMOL 500 MG PO TABS
500.0000 mg | ORAL_TABLET | Freq: Four times a day (QID) | ORAL | Status: DC | PRN
  Filled 2014-03-30: qty 1

## 2014-03-30 MED ORDER — ONDANSETRON HCL 4 MG/2ML IJ SOLN: 4.0000 mg | Freq: Four times a day (QID) | INTRAMUSCULAR | Status: DC | PRN

## 2014-03-30 MED ORDER — TRAMADOL HCL 50 MG PO TABS
50.0000 mg | ORAL_TABLET | Freq: Two times a day (BID) | ORAL | Status: DC | PRN
  Administered 2014-04-01: 50 mg via ORAL
  Filled 2014-03-30: qty 1

## 2014-03-30 MED ORDER — POTASSIUM CHLORIDE CRYS ER 20 MEQ PO TBCR: 20.0000 meq | EXTENDED_RELEASE_TABLET | Freq: Every day | ORAL | Status: DC | PRN

## 2014-03-30 MED ORDER — WARFARIN - PHYSICIAN DOSING INPATIENT
Freq: Every day | Status: DC
  Administered 2014-03-30 – 2014-03-31 (×2)

## 2014-03-30 MED ORDER — BISACODYL 10 MG RE SUPP: 10.0000 mg | Freq: Every day | RECTAL | Status: DC | PRN

## 2014-03-30 MED ORDER — MIRTAZAPINE 30 MG PO TABS
30.0000 mg | ORAL_TABLET | Freq: Every day | ORAL | Status: DC
  Administered 2014-03-30 – 2014-03-31 (×2): 30 mg via ORAL
  Filled 2014-03-30 (×4): qty 1

## 2014-03-30 MED ORDER — DOCUSATE SODIUM 100 MG PO CAPS
100.0000 mg | ORAL_CAPSULE | Freq: Two times a day (BID) | ORAL | Status: DC
  Administered 2014-03-30 – 2014-04-01 (×4): 100 mg via ORAL
  Filled 2014-03-30 (×6): qty 1

## 2014-03-30 MED ORDER — ADULT MULTIVITAMIN W/MINERALS CH
1.0000 | ORAL_TABLET | Freq: Every day | ORAL | Status: DC
  Administered 2014-03-31: 1 via ORAL
  Filled 2014-03-30 (×2): qty 1

## 2014-03-30 MED ORDER — AMLODIPINE BESYLATE 5 MG PO TABS
5.0000 mg | ORAL_TABLET | Freq: Every day | ORAL | Status: DC
  Administered 2014-03-30 – 2014-04-01 (×3): 5 mg via ORAL
  Filled 2014-03-30 (×3): qty 1

## 2014-03-30 MED ORDER — SODIUM CHLORIDE 0.9 % IV SOLN: INTRAVENOUS | Status: DC

## 2014-03-30 MED ORDER — GUAIFENESIN-DM 100-10 MG/5ML PO SYRP: 15.0000 mL | ORAL_SOLUTION | ORAL | Status: DC | PRN

## 2014-03-30 MED ORDER — LOSARTAN POTASSIUM 50 MG PO TABS
100.0000 mg | ORAL_TABLET | Freq: Every day | ORAL | Status: DC
  Administered 2014-03-30 – 2014-04-01 (×3): 100 mg via ORAL
  Filled 2014-03-30 (×3): qty 2

## 2014-03-30 MED ORDER — SUCCINYLCHOLINE CHLORIDE 20 MG/ML IJ SOLN
INTRAMUSCULAR | Status: DC | PRN
  Administered 2014-03-30: 100 mg via INTRAVENOUS

## 2014-03-30 MED ORDER — LATANOPROST 0.005 % OP SOLN
1.0000 [drp] | Freq: Every day | OPHTHALMIC | Status: DC
  Administered 2014-03-30 – 2014-03-31 (×2): 1 [drp] via OPHTHALMIC
  Filled 2014-03-30 (×2): qty 2.5

## 2014-03-30 MED ORDER — LIDOCAINE HCL (CARDIAC) 20 MG/ML IV SOLN
INTRAVENOUS | Status: DC | PRN
  Administered 2014-03-30: 50 mg via INTRAVENOUS

## 2014-03-30 MED ORDER — WARFARIN SODIUM 5 MG PO TABS
5.0000 mg | ORAL_TABLET | Freq: Every day | ORAL | Status: DC
  Administered 2014-03-30 – 2014-03-31 (×2): 5 mg via ORAL
  Filled 2014-03-30 (×3): qty 1

## 2014-03-30 MED ORDER — PROPOFOL 10 MG/ML IV BOLUS
INTRAVENOUS | Status: DC | PRN
  Administered 2014-03-30: 100 mg via INTRAVENOUS

## 2014-03-30 MED ORDER — FENTANYL CITRATE 0.05 MG/ML IJ SOLN
INTRAMUSCULAR | Status: DC | PRN
  Administered 2014-03-30: 50 ug via INTRAVENOUS
  Administered 2014-03-30: 100 ug via INTRAVENOUS

## 2014-03-30 MED ORDER — PROMETHAZINE HCL 25 MG/ML IJ SOLN: 6.2500 mg | INTRAMUSCULAR | Status: DC | PRN

## 2014-03-30 MED ORDER — OXYCODONE HCL 5 MG/5ML PO SOLN: 5.0000 mg | Freq: Once | ORAL | Status: DC | PRN

## 2014-03-30 MED ORDER — IRBESARTAN 300 MG PO TABS: 300.0000 mg | ORAL_TABLET | Freq: Every day | ORAL | Status: DC

## 2014-03-30 MED ORDER — OXYCODONE HCL 5 MG PO TABS: 5.0000 mg | ORAL_TABLET | Freq: Once | ORAL | Status: DC | PRN

## 2014-03-30 MED ORDER — PROPOFOL 10 MG/ML IV BOLUS
INTRAVENOUS | Status: AC
  Filled 2014-03-30: qty 20

## 2014-03-30 MED ORDER — HYDRALAZINE HCL 20 MG/ML IJ SOLN: 5.0000 mg | INTRAMUSCULAR | Status: DC | PRN

## 2014-03-30 MED ORDER — 0.9 % SODIUM CHLORIDE (POUR BTL) OPTIME
TOPICAL | Status: DC | PRN
  Administered 2014-03-30: 1000 mL

## 2014-03-30 SURGICAL SUPPLY — 46 items
BANDAGE ELASTIC 4 VELCRO ST LF (GAUZE/BANDAGES/DRESSINGS) ×3 IMPLANT
BANDAGE ELASTIC 6 VELCRO ST LF (GAUZE/BANDAGES/DRESSINGS) ×3 IMPLANT
BANDAGE ESMARK 6X9 LF (GAUZE/BANDAGES/DRESSINGS) ×1 IMPLANT
BLADE SAW SAG 29X58X.64 (BLADE) ×3 IMPLANT
BNDG CMPR 9X6 STRL LF SNTH (GAUZE/BANDAGES/DRESSINGS) ×1
BNDG COHESIVE 6X5 TAN STRL LF (GAUZE/BANDAGES/DRESSINGS) ×3 IMPLANT
BNDG ESMARK 6X9 LF (GAUZE/BANDAGES/DRESSINGS) ×3
BNDG GAUZE ELAST 4 BULKY (GAUZE/BANDAGES/DRESSINGS) ×4 IMPLANT
CANISTER SUCTION 2500CC (MISCELLANEOUS) ×3 IMPLANT
CLIP TI MEDIUM 6 (CLIP) ×3 IMPLANT
COVER SURGICAL LIGHT HANDLE (MISCELLANEOUS) ×3 IMPLANT
CUFF TOURNIQUET SINGLE 24IN (TOURNIQUET CUFF) ×2 IMPLANT
DRAPE ORTHO SPLIT 77X108 STRL (DRAPES) ×6
DRAPE PROXIMA HALF (DRAPES) ×3 IMPLANT
DRAPE SURG ORHT 6 SPLT 77X108 (DRAPES) ×2 IMPLANT
DRSG ADAPTIC 3X8 NADH LF (GAUZE/BANDAGES/DRESSINGS) ×3 IMPLANT
ELECT REM PT RETURN 9FT ADLT (ELECTROSURGICAL) ×3
ELECTRODE REM PT RTRN 9FT ADLT (ELECTROSURGICAL) ×1 IMPLANT
GAUZE SPONGE 4X4 12PLY STRL (GAUZE/BANDAGES/DRESSINGS) ×6 IMPLANT
GLOVE BIO SURGEON STRL SZ 6.5 (GLOVE) ×2 IMPLANT
GLOVE BIO SURGEON STRL SZ7 (GLOVE) ×3 IMPLANT
GLOVE BIO SURGEONS STRL SZ 6.5 (GLOVE) ×2
GLOVE BIOGEL PI IND STRL 6.5 (GLOVE) IMPLANT
GLOVE BIOGEL PI IND STRL 7.0 (GLOVE) IMPLANT
GLOVE BIOGEL PI IND STRL 7.5 (GLOVE) ×1 IMPLANT
GLOVE BIOGEL PI INDICATOR 6.5 (GLOVE) ×4
GLOVE BIOGEL PI INDICATOR 7.0 (GLOVE) ×2
GLOVE BIOGEL PI INDICATOR 7.5 (GLOVE) ×2
GLOVE SURG SS PI 7.0 STRL IVOR (GLOVE) ×4 IMPLANT
GOWN STRL REUS W/ TWL LRG LVL3 (GOWN DISPOSABLE) ×3 IMPLANT
GOWN STRL REUS W/TWL LRG LVL3 (GOWN DISPOSABLE) ×12
KIT BASIN OR (CUSTOM PROCEDURE TRAY) ×3 IMPLANT
KIT ROOM TURNOVER OR (KITS) ×3 IMPLANT
NS IRRIG 1000ML POUR BTL (IV SOLUTION) ×3 IMPLANT
PACK GENERAL/GYN (CUSTOM PROCEDURE TRAY) ×3 IMPLANT
PAD ARMBOARD 7.5X6 YLW CONV (MISCELLANEOUS) ×6 IMPLANT
SPONGE GAUZE 4X4 12PLY STER LF (GAUZE/BANDAGES/DRESSINGS) ×2 IMPLANT
STAPLER VISISTAT 35W (STAPLE) ×3 IMPLANT
STOCKINETTE IMPERVIOUS LG (DRAPES) ×3 IMPLANT
SUT SILK 0 TIES 10X30 (SUTURE) ×3 IMPLANT
SUT SILK 2 0 (SUTURE) ×3
SUT SILK 2-0 18XBRD TIE 12 (SUTURE) ×1 IMPLANT
SUT SILK 3 0 SH 30 (SUTURE) ×2 IMPLANT
SUT VIC AB 2-0 CT1 18 (SUTURE) ×6 IMPLANT
UNDERPAD 30X30 INCONTINENT (UNDERPADS AND DIAPERS) ×3 IMPLANT
WATER STERILE IRR 1000ML POUR (IV SOLUTION) ×3 IMPLANT

## 2014-03-30 NOTE — Anesthesia Procedure Notes (Signed)
Procedure Name: Intubation Performed by: Eligha Bridegroom Pre-anesthesia Checklist: Patient identified, Timeout performed, Emergency Drugs available and Suction available Patient Re-evaluated:Patient Re-evaluated prior to inductionOxygen Delivery Method: Circle system utilized Preoxygenation: Pre-oxygenation with 100% oxygen Intubation Type: IV induction Ventilation: Mask ventilation without difficulty Laryngoscope Size: Mac and 4 Grade View: Grade II Tube type: Oral Tube size: 7.5 mm Number of attempts: 1 Placement Confirmation: ETT inserted through vocal cords under direct vision,  breath sounds checked- equal and bilateral and positive ETCO2 Secured at: 21 cm Tube secured with: Tape Dental Injury: Teeth and Oropharynx as per pre-operative assessment

## 2014-03-30 NOTE — Addendum Note (Signed)
Addendum  created 03/30/14 1256 by Eligha Bridegroom, CRNA   Modules edited: Anesthesia Medication Administration

## 2014-03-30 NOTE — Op Note (Signed)
    OPERATIVE NOTE   PROCEDURE: left above-the-knee amputation  PRE-OPERATIVE DIAGNOSIS: left leg rest pain, dementia  POST-OPERATIVE DIAGNOSIS: same as above  SURGEON: Adele Barthel, MD  ASSISTANT(S): Silva Bandy, PAC   ANESTHESIA: general  ESTIMATED BLOOD LOSS: 50 cc  FINDING(S): 1.  Calcified left superficial femoral artery aneurysm 2.  Viable thigh muscle  SPECIMEN(S):  left above-the-knee amputation  INDICATIONS:   [name] is a 81 y.o. male who presents with left critical limb ischemia.  Due to his dementia, he was not a revascularization candidate.  The patient is scheduled for a left above-the-knee amputation.  I discussed in depth with the patient the risks, benefits, and alternatives to this procedure.  The patient is aware that the risk of this operation included but are not limited to:  bleeding, infection, myocardial infarction, stroke, death, failure to heal amputation wound, and possible need for more proximal amputation.  The patient is aware of the risks and agrees proceed forward with the procedure.  DESCRIPTION: After full informed written consent was obtained from the patient, the patient was brought back to the operating room, and placed supine upon the operating table.  Prior to induction, the patient received IV antibiotics.  The patient was then prepped and draped in the standard fashion for an above-the-knee amputation.  I placed a non-sterile tourniquet on the thigh prior to the procedure.  After obtaining adequate anesthesia, the patient was prepped and draped in the standard fashion for a above-the-knee amputation.  I marked out the anterior and posterior flaps for a fish-mouth type of amputation.  I exsanguinated the leg with an Esmarch bandage and then inflated the tourniquet to 250 mm Hg.   I made the incisions for these flaps, and then dissected through the subcutaneous tissue, fascia, and muscles circumferentially.  I elevated  the periosteal tissue 4  cm more proximal than the anterior skin flap.  I then transected the femur with a power saw at this level.  Then I smoothed out the rough edges of the bone with a rasp.  At this point, the specimen was passed off the field as the above-the-knee amputation.  At this point, I clamped all visibly bleeding arteries and veins using a combination of suture ligation with Vicryl suture and electrocautery.  The tourniquet was then deflated at this point.  Bleeding continued to be controlled with electrocautery and suture ligature.  The stump was washed off with sterile normal saline and no further active bleeding was noted.  I reapproximated the anterior and posterior fascia  with interrupted stitches of 2-0 Vicryl.  This was completed along the entire length of anterior and posterior fascia until there were no more loose space in the fascial line.  The skin was then  reapproximated with staples.  The stump was washed off and dried.  The incision was dressed with [employer] and  then fluffs were applied.  Kerlix was wrapped around the leg and then gently an ACE wrap was applied.     COMPLICATIONS: none  CONDITION: stable   Adele Barthel, MD [facility] [place] Office: [phone] Pager: [phone]  03/30/2014, 10:30 AM

## 2014-03-30 NOTE — Telephone Encounter (Signed)
LM for pt at cell #, also LM at SNF with transportation, dpm

## 2014-03-30 NOTE — Transfer of Care (Signed)
Immediate Anesthesia Transfer of Care Note  Patient: [name]  Procedure(s) Performed: Procedure(s): AMPUTATION ABOVE KNEE (Left)  Patient Location: PACU  Anesthesia Type:General  Level of Consciousness: awake, alert  and oriented  Airway & Oxygen Therapy: Patient Spontanous Breathing and Patient connected to face mask oxygen  Post-op Assessment: Report given to PACU RN and Post -op Vital signs reviewed and stable  Post vital signs: Reviewed and stable  Complications: No apparent anesthesia complications

## 2014-03-30 NOTE — Anesthesia Postprocedure Evaluation (Signed)
Anesthesia Post Note  Patient: [name]  Procedure(s) Performed: Procedure(s) (LRB): AMPUTATION ABOVE KNEE (Left)  Anesthesia type: general  Patient location: PACU  Post pain: Pain level controlled  Post assessment: Patient's Cardiovascular Status Stable  Last Vitals:  Filed Vitals:   03/30/14 1215  BP: 138/60  Pulse: 65  Temp:   Resp: 30    Post vital signs: Reviewed and stable  Level of consciousness: sedated  Complications: No apparent anesthesia complications

## 2014-03-30 NOTE — Telephone Encounter (Signed)
-----   Message from Mena Goes, RN sent at 03/30/2014 10:44 AM EST ----- Regarding: Schedule   ----- Message -----    From: Conrad [place], MD    Sent: 03/30/2014  10:32 AM      To: [address]  ZAYN SELLEY [id number] 1924/05/21  Procedure:  left above-the-knee amputation  Asst: Silva Bandy, [facility]   Follow-up: 4 weeks

## 2014-03-30 NOTE — Anesthesia Preprocedure Evaluation (Addendum)
Anesthesia Evaluation  Patient identified by MRN, date of birth, ID band Patient confused    Reviewed: Allergy & Precautions, NPO status   History of Anesthesia Complications Negative for: history of anesthetic complications  Airway Mallampati: II  TM Distance: >3 FB Neck ROM: Full    Dental  (+) Teeth Intact, Dental Advisory Given   Pulmonary neg pulmonary ROS,    Pulmonary exam normal       Cardiovascular hypertension, Pt. on medications + Peripheral Vascular Disease + pacemaker + Valvular Problems/Murmurs  Mitral Stenosis   Neuro/Psych Dementia negative psych ROS   GI/Hepatic negative GI ROS, Neg liver ROS,   Endo/Other    Renal/GU negative Renal ROS     Musculoskeletal   Abdominal   Peds  Hematology negative hematology ROS (+)   Anesthesia Other Findings   Reproductive/Obstetrics                           Anesthesia Physical Anesthesia Plan  ASA: III  Anesthesia Plan: General   Post-op Pain Management:    Induction: Intravenous  Airway Management Planned: Oral ETT  Additional Equipment:   Intra-op Plan:   Post-operative Plan: Extubation in OR  Informed Consent: I have reviewed the patients History and Physical, chart, labs and discussed the procedure including the risks, benefits and alternatives for the proposed anesthesia with the patient or authorized representative who has indicated his/her understanding and acceptance.   Dental advisory given and Consent reviewed with POA  Plan Discussed with: CRNA, Anesthesiologist and Surgeon  Anesthesia Plan Comments:       Anesthesia Quick Evaluation

## 2014-03-30 NOTE — H&P (View-Only) (Signed)
[facility] [place] HISTORY AND PHYSICAL -PAD  History of Present Illness [name] is a 81 y.o. male patient of Dr. Bridgett Larsson who is s/p right above-the-knee amputation (Date: 01/12/14).  He returns today with his son with nursing facility reports of worsening pain and small ulcers left shin, severe pain in left foot. He is non ambulatory, is a resident of a SNF.  Pt Diabetic: No Pt smoker: non-smoker    Past Medical History  Diagnosis Date  . Rheumatic heart disease     moderate mitral stenosis and history of CHF  . Hypertension   . Sick sinus syndrome      Paroxysmal and then constant AF; slow rate on modest AV nodal blocking agents  . Chronic low back pain   . Atrial fibrillation     Per record has Afib/Aflutter with SSS  . PVD (peripheral vascular disease)     Right superficial femoral to peroneal artery bypass graft with nonreversed saphenous vein composite  2002  . Mesenteric ischemia     SMA artery embolectomy in 2002  . Dementia     mild/H&P (12/13/2011)  . Prostate cancer   . Shortness of breath     "@ night" (12/13/2011)  . Pacemaker   . Pacemaker     Social History History  Substance Use Topics  . Smoking status: Never Smoker   . Smokeless tobacco: Never Used  . Alcohol Use: No    Family History No family history on file.  Past Surgical History  Procedure Laterality Date  . Transurethral resection of prostate    . Embolectomy  2002    SMA artery  . Femoral-peroneal bypass graft  2002    right superficial (12/13/2011)  . Orif hip fracture Right 09/30/2013    Procedure: OPEN TREATMENT INTERNAL FIXATION OF RIGHT HIP FRACTURE;  Surgeon: Sanjuana Kava, MD;  Location: [facility];  Service: Orthopedics;  Laterality: Right;  . Amputation Right 01/12/2014    Procedure: RIGHT  ABOVE KNEE AMPUTATION;  Surgeon: Conrad [place], MD;  Location: [facility];  Service: Vascular;  Laterality: Right;  . Permanent pacemaker insertion N/A 12/16/2011   Procedure: PERMANENT PACEMAKER INSERTION;  Surgeon: Deboraha Sprang, MD;  Location: [facility] CATH LAB;  Service: Cardiovascular;  Laterality: N/A;    No Known Allergies  Current Outpatient Prescriptions  Medication Sig Dispense Refill  . acetaminophen (TYLENOL) 650 MG CR tablet Take 650 mg by mouth every 6 (six) hours as needed for pain.    Marland Kitchen amLODipine (NORVASC) 5 MG tablet Take 1 tablet (5 mg total) by mouth daily. 90 tablet 3  . calcium-vitamin D (OSCAL WITH D) 500-200 MG-UNIT per tablet Take 1 tablet by mouth 2 (two) times daily.    Marland Kitchen COUMADIN 2.5 MG tablet     . docusate sodium (COLACE) 100 MG capsule Take 100 mg by mouth 2 (two) times daily.    Marland Kitchen donepezil (ARICEPT) 5 MG tablet Take 5 mg by mouth daily.    . feeding supplement, GLUCERNA SHAKE, (GLUCERNA SHAKE) LIQD Take 237 mLs by mouth at bedtime.  0  . HYDROcodone-acetaminophen (NORCO/VICODIN) 5-325 MG per tablet Take 1-2 tablets by mouth every 6 (six) hours as needed for moderate pain. 30 tablet 0  . losartan (COZAAR) 100 MG tablet Take 100 mg by mouth daily.    . methocarbamol (ROBAXIN) 500 MG tablet Take 1 tablet (500 mg total) by mouth every 6 (six) hours as needed for muscle spasms. 20 tablet 0  .  mirtazapine (REMERON) 15 MG tablet Take 15 mg by mouth at bedtime.    . Multiple Vitamin (MULTIVITAMIN WITH MINERALS) TABS tablet Take 1 tablet by mouth daily.    . polyethylene glycol powder (GLYCOLAX/MIRALAX) powder     . traMADol (ULTRAM) 50 MG tablet Take 50 mg by mouth every 6 (six) hours as needed (pain).     . TRAVATAN Z 0.004 % SOLN ophthalmic solution Place 1 drop into both eyes at bedtime.     Marland Kitchen warfarin (COUMADIN) 5 MG tablet Take 5 mg by mouth daily. Take 1 tablet (5mg ) daily except 1/2 tablet (2.5mg  ) on Mon & Thurs    . zinc gluconate 50 MG tablet Take 50 mg by mouth daily.    Marland Kitchen olmesartan (BENICAR) 40 MG tablet Take 1 tablet (40 mg total) by mouth daily. 30 tablet 6   No current facility-administered medications for this  visit.    ROS: See HPI for pertinent positives and negatives.   Physical Examination  Filed Vitals:   03/25/14 0915  BP: 112/74  Pulse: 63  Resp: 16  Height: 5\' 4"  (1.626 m)  Weight: 103 lb (46.72 kg)   Body mass index is 17.67 kg/(m^2).  General: A&O x 2, WDWN, in w/c, moaning, painful to light touch at left foot and lower leg. Painful to touch at right groin. Gait: in w/c Eyes: no gross abnormalities  Pulmonary: CTAB, without wheezes , rales or rhonchi. Cardiac: regular Rythm , without detected murmur, pacemaker palpated subcut. At left chest.         Carotid Bruits Right Left   Negative Negative  Aorta is not palpable.                         VASCULAR EXAM: Extremities with ischemic changes: left foot is cool and dark, 1+ non pitting edema, gangrenous left 5 toes; with 0.5 cm shallow ulcer at left mid tibial area. Severe pain to light touch of left foot, painful to touch left lower leg also. Right healed AKA.                                                                                                          LE Pulses Right Left       FEMORAL  not palpable, tender to palpation, no swelling or wounds present  2+ palpable        POPLITEAL  AKA  Not palpable       POSTERIOR TIBIAL  AKA   non-Dopplerable         DORSALIS PEDIS      ANTERIOR TIBIAL AKA  non-Dopplerable     Abdomen: soft, NT, no palpable masses. Skin: no rashes, see extremities Musculoskeletal: no muscle wasting or atrophy. Right AKA.  Neurologic: A&O X 2; Appropriate Affect ; SENSATION: normal; MOTOR FUNCTION:  moving all extremities equally, motor strength 3/5 throughout. Speech is curt/normal, responds appropriately to questions.    ASSESSMENT: [name] is a 81 y.o. male who is s/p right above-the-knee amputation (Date:  01/12/14).  He returns today with his son with nursing facility reports of worsening pain and small ulcers left shin, severe pain in left foot. He is non ambulatory,  is a resident of a SNF. Gangrene of left foot, severe pain of ischemia of foot. Dr. Bridgett Larsson spoke with pt and son, examined pt. Son states he is pt's POA and states he agrees to proceed with left AKA for his father.   PLAN:  Schedule left AKA , pt must first stay off coumadin for 5 days, Dr. Bridgett Larsson.  The patient was given information about PAD including signs, symptoms, treatment, what symptoms should prompt the patient to seek immediate medical care, and risk reduction measures to take.  Clemon Chambers, RN, MSN, FNP-C [facility] [employer] Phone: [phone]  Clinic MD: Bridgett Larsson  03/25/2014 9:23 AM

## 2014-03-30 NOTE — Interval H&P Note (Signed)
History and Physical Interval Note:  03/30/2014 8:02 AM  [name]  has presented today for surgery, with the diagnosis of Ischemic left foot M62.89  The various methods of treatment have been discussed with the patient and family. After consideration of risks, benefits and other options for treatment, the patient has consented to  Procedure(s): AMPUTATION ABOVE KNEE (Left) as a surgical intervention .  The patient's history has been reviewed, patient examined, no change in status, stable for surgery.  I have reviewed the patient's chart and labs.  Questions were answered to the patient's satisfaction.     CHEN,BRIAN LIANG-YU

## 2014-03-31 ENCOUNTER — Encounter (HOSPITAL_COMMUNITY): Payer: Self-pay | Admitting: Vascular Surgery

## 2014-03-31 LAB — BASIC METABOLIC PANEL
Anion gap: 10 (ref 5–15)
BUN: 21 mg/dL (ref 6–23)
CO2: 24 mmol/L (ref 19–32)
CREATININE: 1.02 mg/dL (ref 0.50–1.35)
Calcium: 9 mg/dL (ref 8.4–10.5)
Chloride: 110 mEq/L (ref 96–112)
GFR calc Af Amer: 74 mL/min — ABNORMAL LOW (ref >90)
GFR, EST NON AFRICAN AMERICAN: 64 mL/min — AB (ref >90)
Glucose, Bld: 93 mg/dL (ref 70–99)
POTASSIUM: 4.4 mmol/L (ref 3.5–5.1)
Sodium: 144 mmol/L (ref 135–145)

## 2014-03-31 LAB — CBC
HEMATOCRIT: 36.2 % — AB (ref 39.0–52.0)
Hemoglobin: 11.7 g/dL — ABNORMAL LOW (ref 13.0–17.0)
MCH: 27.5 pg (ref 26.0–34.0)
MCHC: 32.3 g/dL (ref 30.0–36.0)
MCV: 85 fL (ref 78.0–100.0)
PLATELETS: 261 10*3/uL (ref 150–400)
RBC: 4.26 MIL/uL (ref 4.22–5.81)
RDW: 16.2 % — AB (ref 11.5–15.5)
WBC: 10.6 10*3/uL — AB (ref 4.0–10.5)

## 2014-03-31 LAB — PROTIME-INR
INR: 1.19 (ref 0.00–1.49)
Prothrombin Time: 15.3 seconds — ABNORMAL HIGH (ref 11.6–15.2)

## 2014-03-31 MED ORDER — PRO-STAT SUGAR FREE PO LIQD: 30.0000 mL | Freq: Two times a day (BID) | ORAL | Status: DC

## 2014-03-31 MED ORDER — ENSURE COMPLETE PO LIQD
237.0000 mL | Freq: Two times a day (BID) | ORAL | Status: DC
  Administered 2014-03-31 – 2014-04-01 (×3): 237 mL via ORAL

## 2014-03-31 MED ORDER — PRO-STAT SUGAR FREE PO LIQD
30.0000 mL | Freq: Two times a day (BID) | ORAL | Status: DC
  Administered 2014-03-31: 30 mL via ORAL
  Filled 2014-03-31 (×4): qty 30

## 2014-03-31 NOTE — Evaluation (Signed)
Physical Therapy Evaluation Patient Details Name: [name] MRN: [id number] DOB: Aug 03, 1924 Today's Date: 03/31/2014   History of Present Illness  Pt S/p L AKA  1/13 with prior R AKA 01/12/14  Clinical Impression  Pt pleasant but very limited verbalizations or response throughout session. He was able to state his name but did not respond to other questions with PLOF provided by SNF staff. Pt assisting with sitting and attempting to scoot but requires total assist of 2 currently. Pt will benefit from acute trial of therapy to maximize mobility and balance to attempt to return pt to [facility] mobility level and decrease burden of care.     Follow Up Recommendations SNF    Equipment Recommendations  None recommended by PT    Recommendations for Other Services       Precautions / Restrictions Precautions Precautions: Fall Precaution Comments: bil AKA, dementia      Mobility  Bed Mobility Overal bed mobility: Needs Assistance Bed Mobility: Supine to Sit     Supine to sit: Mod assist     General bed mobility comments: mod assist with cues to achieve long sitting. Without back supported cannot maintain his balance  Transfers Overall transfer level: Needs assistance   Transfers: Comptroller transfers: Total assist;+2 physical assistance   General transfer comment: cues for sequence with total assist to pivot, lean anteriorly and scoot into chair  Ambulation/Gait                Stairs            Wheelchair Mobility    Modified Rankin (Stroke Patients Only)       Balance Overall balance assessment: Needs assistance   Sitting balance-Leahy Scale: Zero                                       Pertinent Vitals/Pain Pain Assessment: No/denies pain (PAINAD= 0)    Home Living Family/patient expects to be discharged to:: Skilled nursing facility                 Additional Comments: pt not  verbalizing, however per chart pt from [facility].      Prior Function Level of Independence: Needs assistance   Gait / Transfers Assistance Needed: per staff was total assist to lift from bed to chair, pt propels WC and wanders with chair  ADL's / Homemaking Assistance Needed: total assist for ADLs and supervision for eating  Comments: PLOF per Bunnie Domino at [facility]        Extremity/Trunk Assessment   Upper Extremity Assessment: Generalized weakness           Lower Extremity Assessment:  (bil AKA)      Cervical / Trunk Assessment: Kyphotic  Communication   Communication: HOH  Cognition Arousal/Alertness: Awake/alert Behavior During Therapy: Flat affect Overall Cognitive Status: No family/caregiver present to determine baseline cognitive functioning                      General Comments      Exercises        Assessment/Plan    PT Assessment Patient needs continued PT services  PT Diagnosis Altered mental status;Generalized weakness   PT Problem List Decreased strength;Decreased cognition;Decreased balance;Decreased mobility;Decreased activity tolerance  PT Treatment Interventions Functional mobility training;Therapeutic activities;Wheelchair  mobility training;Patient/family education   PT Goals (Current goals can be found in the Care Plan section) Acute Rehab PT Goals PT Goal Formulation: Patient unable to participate in goal setting Time For Goal Achievement: 04/07/14 Potential to Achieve Goals: Poor    Frequency Min 2X/week (trial)   Barriers to discharge        Co-evaluation               End of Session   Activity Tolerance: Patient tolerated treatment well Patient left: in chair;with chair alarm set;with call bell/phone within reach Nurse Communication: Mobility status;Precautions         Time: 5465-6812 PT Time Calculation (min) (ACUTE ONLY): 15 min   Charges:   PT Evaluation $Initial PT  Evaluation Tier I: 1 Procedure PT Treatments $Therapeutic Activity: 8-22 mins   PT G Codes:        Melford Aase 03/31/2014, 9:37 AM Elwyn Reach, [place]

## 2014-03-31 NOTE — Evaluation (Signed)
Occupational Therapy Evaluation Patient Details Name: [name] MRN: [id number] DOB: Apr 07, 1924 Today's Date: 03/31/2014    History of Present Illness Pt S/p L AKA  1/13 with prior R AKA 01/12/14, advanced dementia   Clinical Impression   Pt is a resident of a SNF.  He feeds himself with supervision, but has had a poor appetite for some time.  He is dependent in all ADL and transfers.  Pt currently is requiring total assist to eat.  No acute OT needs.  Agree with plans to return to SNF.    Follow Up Recommendations  SNF;Supervision/Assistance - 24 hour    Equipment Recommendations       Recommendations for Other Services       Precautions / Restrictions Precautions Precautions: Fall Precaution Comments: bil AKA, dementia      Mobility Bed Mobility  Bed Mobility: Rolling Rolling: Total assist          Transfers Overall transfer level: Needs assistance     General transfer comment: +2 total assist    Balance Overall balance assessment: Needs assistance   Sitting balance-Leahy Scale: Zero                                      ADL Overall ADL's : Needs assistance/impaired Eating/Feeding: Total assistance;Sitting                                   Functional mobility during ADLs: +2 for physical assistance;Total assistance General ADL Comments: Pt not self feeding as he does at baseline.  Total assist for bathing, dressing, incontinent of bowel and bladder.     Vision                     Perception     Praxis      Pertinent Vitals/Pain Pain Assessment: Faces Faces Pain Scale: Hurts even more Pain Location: unable to state  Pain Descriptors / Indicators: Grimacing;Crying Pain Intervention(s): Limited activity within patient's tolerance;Repositioned     Hand Dominance Right   Extremity/Trunk Assessment Upper Extremity Assessment Upper Extremity Assessment: Generalized weakness   Lower Extremity  Assessment Lower Extremity Assessment: Defer to PT evaluation   Cervical / Trunk Assessment Cervical / Trunk Assessment: Kyphotic   Communication Communication Communication: HOH   Cognition Arousal/Alertness: Awake/alert Behavior During Therapy: Flat affect Overall Cognitive Status: History of cognitive impairments - at baseline                     General Comments       Exercises       Shoulder Instructions      Home Living Family/patient expects to be discharged to:: Skilled nursing facility                                 Additional Comments: pt from [facility] where he is a resident      Prior Functioning/Environment Level of Independence: Needs assistance  Gait / Transfers Assistance Needed: per staff was total assist to lift from bed to chair, pt propels WC and wanders with chair ADL's / Homemaking Assistance Needed: total assist for ADLs and supervision for eating   Comments: PLOF per [name] at [facility]    OT Diagnosis:  OT Problem List:     OT Treatment/Interventions:      OT Goals(Current goals can be found in the care plan section)    OT Frequency:     Barriers to D/C:            Co-evaluation              End of Session    Activity Tolerance: Patient limited by pain Patient left: in bed;with call bell/phone within reach;with nursing/sitter in room   Time: 1045-1058 OT Time Calculation (min): 13 min Charges:  OT General Charges $OT Visit: 1 Procedure OT Evaluation $Initial OT Evaluation Tier I: 1 Procedure OT Treatments $Self Care/Home Management : 8-22 mins G-Codes:    [name] 03/31/2014, 10:58 AM  [phone]

## 2014-03-31 NOTE — Progress Notes (Addendum)
  [facility] Progress Note  03/31/2014 7:29 AM POD 1  Subjective:  Daughter in law at bedside. Patient with alzheimer's and dementia. Sleepy this morning. Awakes to command.   Tmax 99.1 BP sys 100s-170s 02 100% RA  Filed Vitals:   03/31/14 0517  BP: 146/82  Pulse: 70  Temp: 98.4 F (36.9 C)  Resp:     Physical Exam: Sleepy. Oriented to person.  Left AKA dressing clean and dry.    CBC    Component Value Date/Time   WBC 10.6* 03/31/2014 0406   RBC 4.26 03/31/2014 0406   HGB 11.7* 03/31/2014 0406   HCT 36.2* 03/31/2014 0406   PLT 261 03/31/2014 0406   MCV 85.0 03/31/2014 0406   MCH 27.5 03/31/2014 0406   MCHC 32.3 03/31/2014 0406   RDW 16.2* 03/31/2014 0406   LYMPHSABS 0.9 10/02/2013 0622   MONOABS 1.5* 10/02/2013 0622   EOSABS 0.1 10/02/2013 0622   BASOSABS 0.0 10/02/2013 0622    BMET    Component Value Date/Time   NA 144 03/31/2014 0406   K 4.4 03/31/2014 0406   CL 110 03/31/2014 0406   CO2 24 03/31/2014 0406   GLUCOSE 93 03/31/2014 0406   BUN 21 03/31/2014 0406   CREATININE 1.02 03/31/2014 0406   CREATININE 1.10 06/27/2011 1413   CALCIUM 9.0 03/31/2014 0406   GFRNONAA 64* 03/31/2014 0406   GFRAA 74* 03/31/2014 0406    INR    Component Value Date/Time   INR 1.19 03/31/2014 0406   INR 3.0 09/23/2013 1005   INR 2.9 05/17/2010 1129     Intake/Output Summary (Last 24 hours) at 03/31/14 0729 Last data filed at 03/31/14 0335  Gross per 24 hour  Intake    950 ml  Output    700 ml  Net    250 ml     Assessment/Plan:  81 y.o. male is s/p left above knee amputation  POD 1 -Left AKA dressing clean. Will take down dressing tomorrow. -Patient is not a candidate for CIR. Will return to SNF at discharge.  -Restarted on coumadin yesterday. INR 1.19. -Hgb stable.  -Dispo: anticipate discharge to SNF tomorrow.    Virgina Jock, PA-C [facility] Office: [phone] Pager: [phone] 03/31/2014 7:29  AM    Addendum  I have independently interviewed and examined the patient, and I agree with the physician assistant's findings.  No signs of acute bleeding on bandages.  No rehab potential.  Will return to SNF tomorrow.  Staples out in 4 weeks.  Adele Barthel, MD [facility] [place] Office: [phone] Pager: [phone]  03/31/2014, 8:16 AM

## 2014-03-31 NOTE — Progress Notes (Signed)
INITIAL NUTRITION ASSESSMENT  DOCUMENTATION CODES Per approved criteria  -Not Applicable   INTERVENTION: Provide Ensure Complete po BID, each supplement provides 350 kcal and 13 grams of protein Provide Pro-stat BID with breakfast and lunch, each dose provides 100 kcal and 15 grams of protein Provide Multivitamin with minerals daily  NUTRITION DIAGNOSIS: Inadequate oral intake related to dementia and advanced age as evidenced by 25% meal completion and 14% weight loss in less than 6 months.   Goal: Pt to meet >/= 90% of their estimated nutrition needs   Monitor:  PO intake, weight trend, supplement acceptance, labs  Reason for Assessment: Malnutrition Screening Tool  81 y.o. male  Admitting Dx: <principal problem not specified>  ASSESSMENT: 81 y.o. male who is s/p right above-the-knee amputation (Date: 01/12/14).He returns with nursing facility reports of worsening pain and small ulcers left shin, severe pain in left foot. Pt is now s/p L AKA.   Per RN, pt consumed about 25% of breakfast this morning. Pt asleep with no family present at time of visit. Pt was weighing 125 to 130 lbs back in July. Before R BKA pt weighed 108 lbs and before L BKA pt weighed 103 lbs. Expected weight loss from R and L BKA is 13% and actual weight loss s/p bilateral BKA's was 17%. Per chart, pt was drinking Glucerna Shakes once daily PTA.  Pt appears to have severe temporal wasting.   Labs: low hemoglobin, decreased GFR  Height: Ht Readings from Last 1 Encounters:  03/25/14 5\' 4"  (1.626 m)    Weight: Wt Readings from Last 1 Encounters:  03/31/14 90 lb 9.7 oz (41.1 kg)    Ideal Body Weight: 113 lbs  % Ideal Body Weight: 80%  Wt Readings from Last 10 Encounters:  03/31/14 90 lb 9.7 oz (41.1 kg)  03/25/14 103 lb (46.72 kg)  03/21/14 100 lb (45.36 kg)  02/18/14 102 lb (46.267 kg)  01/28/14 102 lb (46.267 kg)  01/14/14 102 lb 4.7 oz (46.4 kg)  01/07/14 103 lb (46.72 kg)  10/04/13 131 lb  1.6 oz (59.467 kg)  12/14/12 133 lb 12 oz (60.669 kg)  06/15/12 119 lb (53.978 kg)  01/12/14 108 lbs  Usual Body Weight: unknown  % Usual Body Weight: NA  BMI:  Body mass index is 18.84 kg/(m^2) (BMI adjusted for bilateral BKA's)  Estimated Nutritional Needs: Kcal: 1100-1300 Protein: 55-65 grams Fluid: 1.2 L/day  Skin: closed incision on left thigh  Diet Order: Diet regular  EDUCATION NEEDS: -No education needs identified at this time   Intake/Output Summary (Last 24 hours) at 03/31/14 1027 Last data filed at 03/31/14 0901  Gross per 24 hour  Intake   1170 ml  Output   1100 ml  Net     70 ml    Last BM: PTA  Labs:   Recent Labs Lab 03/30/14 0815 03/30/14 0939 03/31/14 0406  NA 139 141 144  K 4.7 4.7 4.4  CL 105  --  110  CO2 25  --  24  BUN 24*  --  21  CREATININE 1.07  --  1.02  CALCIUM 9.0  --  9.0  GLUCOSE 91  --  93    CBG (last 3)  No results for input(s): GLUCAP in the last 72 hours.  Scheduled Meds: . amLODipine  5 mg Oral Daily  . cefUROXime (ZINACEF)  IV  1.5 g Intravenous Q12H  . docusate sodium  100 mg Oral BID  . donepezil  5 mg Oral  Daily  . feeding supplement (GLUCERNA SHAKE)  237 mL Oral QHS  . latanoprost  1 drop Both Eyes QHS  . loratadine  10 mg Oral Daily  . losartan  100 mg Oral Daily  . mirtazapine  30 mg Oral QHS  . multivitamin with minerals  1 tablet Oral Daily  . pantoprazole  40 mg Oral Daily  . warfarin  5 mg Oral q1800  . Warfarin - Physician Dosing Inpatient   Does not apply q1800    Continuous Infusions: . sodium chloride      Past Medical History  Diagnosis Date  . Rheumatic heart disease     moderate mitral stenosis and history of CHF  . Hypertension   . Sick sinus syndrome      Paroxysmal and then constant AF; slow rate on modest AV nodal blocking agents  . Chronic low back pain   . Atrial fibrillation     Per record has Afib/Aflutter with SSS  . PVD (peripheral vascular disease)     Right  superficial femoral to peroneal artery bypass graft with nonreversed saphenous vein composite  2002  . Mesenteric ischemia     SMA artery embolectomy in 2002  . Dementia     mild/H&P (12/13/2011)  . Prostate cancer   . Shortness of breath     "@ night" (12/13/2011)  . Pacemaker   . Pacemaker     Past Surgical History  Procedure Laterality Date  . Transurethral resection of prostate    . Embolectomy  2002    SMA artery  . Femoral-peroneal bypass graft  2002    right superficial (12/13/2011)  . Orif hip fracture Right 09/30/2013    Procedure: OPEN TREATMENT INTERNAL FIXATION OF RIGHT HIP FRACTURE;  Surgeon: Sanjuana Kava, MD;  Location: [facility];  Service: Orthopedics;  Laterality: Right;  . Amputation Right 01/12/2014    Procedure: RIGHT  ABOVE KNEE AMPUTATION;  Surgeon: Conrad , MD;  Location: [place];  Service: Vascular;  Laterality: Right;  . Permanent pacemaker insertion N/A 12/16/2011    Procedure: PERMANENT PACEMAKER INSERTION;  Surgeon: Deboraha Sprang, MD;  Location: [facility] CATH LAB;  Service: Cardiovascular;  Laterality: N/A;    Pryor Ochoa RD, LDN Inpatient Clinical Dietitian Pager: [phone] After Hours Pager: [phone]

## 2014-04-01 LAB — CBC
HCT: 36.2 % — ABNORMAL LOW (ref 39.0–52.0)
Hemoglobin: 11.7 g/dL — ABNORMAL LOW (ref 13.0–17.0)
MCH: 27 pg (ref 26.0–34.0)
MCHC: 32.3 g/dL (ref 30.0–36.0)
MCV: 83.6 fL (ref 78.0–100.0)
Platelets: 231 10*3/uL (ref 150–400)
RBC: 4.33 MIL/uL (ref 4.22–5.81)
RDW: 15.9 % — ABNORMAL HIGH (ref 11.5–15.5)
WBC: 7.6 10*3/uL (ref 4.0–10.5)

## 2014-04-01 LAB — BASIC METABOLIC PANEL
ANION GAP: 8 (ref 5–15)
BUN: 18 mg/dL (ref 6–23)
CO2: 26 mmol/L (ref 19–32)
Calcium: 8.6 mg/dL (ref 8.4–10.5)
Chloride: 107 mEq/L (ref 96–112)
Creatinine, Ser: 0.9 mg/dL (ref 0.50–1.35)
GFR calc Af Amer: 86 mL/min — ABNORMAL LOW (ref >90)
GFR calc non Af Amer: 74 mL/min — ABNORMAL LOW (ref >90)
Glucose, Bld: 101 mg/dL — ABNORMAL HIGH (ref 70–99)
POTASSIUM: 4.2 mmol/L (ref 3.5–5.1)
Sodium: 141 mmol/L (ref 135–145)

## 2014-04-01 LAB — MRSA PCR SCREENING: MRSA by PCR: NEGATIVE

## 2014-04-01 LAB — PROTIME-INR
INR: 1.5 — ABNORMAL HIGH (ref 0.00–1.49)
PROTHROMBIN TIME: 18.3 s — AB (ref 11.6–15.2)

## 2014-04-01 MED ORDER — HYDROCODONE-ACETAMINOPHEN 5-325 MG PO TABS: 1.0000 | ORAL_TABLET | Freq: Four times a day (QID) | ORAL | Status: AC | PRN

## 2014-04-01 NOTE — Clinical Documentation Improvement (Signed)
  Eval 1/14 by Registered Dietician states BMI 18.84, is 80% Ideal body weight, current weight 90 lbs 9 oz with height 5'4", 14% weight loss in less than 43mos. Please address low BMI in Notes to illustrate severity of illness and risk of mortality.  . Adult BMI (21 years +): --19 or less = underweight  Thank Sheral Flow RN [address] [phone] HIM department

## 2014-04-01 NOTE — Clinical Social Work Note (Addendum)
12:30pm CSW confirmed that patient can return to [facility] sent.  Patients son informed of DC.  8:40am CSW called [facility] admissions to organize pt DC back to SNF- left message.  CSW will continue to follow.  Domenica Reamer, [place] Social Worker [phone]

## 2014-04-01 NOTE — Discharge Summary (Signed)
[facility] Discharge Summary  [name] 26-Feb-1925 81 y.o. male  [id number]  Admission Date: 03/30/2014  Discharge Date: 04/01/2014  Physician: Conrad Manton, MD  Admission Diagnosis: Ischemic left foot M62.89  HPI:   This is a 82 y.o. male who is s/p right above-the-knee amputation (Date: 01/12/14).He returned to the [facility] office with his son with nursing facility reports of worsening pain and small ulcers left shin, severe pain in left foot. He is non ambulatory, is a resident of a SNF. He has dementia.   Hospital Course:  The patient was admitted to the hospital and taken to the operating room on 03/30/2014 and underwent: left above-the-knee amputation  The patient tolerated the procedure well and was transported to the PACU in stable condition.   On POD 1, his dressing was clean and dry. His dressing was taking down on POD 2. His staple line was clean, dry and intact. The stump was viable without edema. A retention stocking was ordered. He was stable for discharge. He was discharged back to SNF on POD 2 in good condition.    CBC    Component Value Date/Time   WBC 7.6 04/01/2014 0428   RBC 4.33 04/01/2014 0428   HGB 11.7* 04/01/2014 0428   HCT 36.2* 04/01/2014 0428   PLT 231 04/01/2014 0428   MCV 83.6 04/01/2014 0428   MCH 27.0 04/01/2014 0428   MCHC 32.3 04/01/2014 0428   RDW 15.9* 04/01/2014 0428   LYMPHSABS 0.9 10/02/2013 0622   MONOABS 1.5* 10/02/2013 0622   EOSABS 0.1 10/02/2013 0622   BASOSABS 0.0 10/02/2013 0622    BMET    Component Value Date/Time   NA 141 04/01/2014 0428   K 4.2 04/01/2014 0428   CL 107 04/01/2014 0428   CO2 26 04/01/2014 0428   GLUCOSE 101* 04/01/2014 0428   BUN 18 04/01/2014 0428   CREATININE 0.90 04/01/2014 0428   CREATININE 1.10 06/27/2011 1413   CALCIUM 8.6 04/01/2014 0428   GFRNONAA 74* 04/01/2014 0428   GFRAA 86* 04/01/2014 0428     Discharge Instructions:   The patient is discharged to SNF with  extensive instructions on wound care and progressive ambulation.  They are instructed not to drive or perform any heavy lifting until returning to see the physician in his office.      Discharge Instructions    Call MD for:  redness, tenderness, or signs of infection (pain, swelling, bleeding, redness, odor or green/yellow discharge around incision site)    Complete by:  As directed      Call MD for:  severe or increased pain, loss or decreased feeling  in affected limb(s)    Complete by:  As directed      Call MD for:  temperature >100.5    Complete by:  As directed      Discharge wound care:    Complete by:  As directed   Wash wound daily with soap and water. Twice a day dressing changes: Gauze to staple line. Wrap with kerlix and ACE wrap. Then place in retention sock.     Increase activity slowly    Complete by:  As directed   Walk with assistance use walker or cane as needed     Lifting restrictions    Complete by:  As directed   No lifting for 2 weeks     Resume previous diet    Complete by:  As directed  Discharge Diagnosis:  Ischemic left foot M62.89  Secondary Diagnosis: Patient Active Problem List   Diagnosis Date Noted  . Atherosclerosis of native artery of left leg with ulceration 03/30/2014  . S/P AKA (above knee amputation) unilateral 01/28/2014  . 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 Extremity atherosclerosis with gangrene 01/07/2014  . Fracture, intertrochanteric, right femur 09/25/2013  . Hip fracture 09/25/2013  . Encounter for therapeutic drug monitoring 05/24/2013  . Pacemaker-Medtronic 12/17/2011  . Dementia 12/06/2011  . Incontinence of urine 12/06/2011  . Hypertension 07/16/2011  . Atrial fibrillation 06/12/2010  . 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 Encounter for long-term (current) use of anticoagulants 06/12/2010  . HYPERLIPIDEMIA 09/11/2009  . Mitral stenosis 09/11/2009  . LOW BACK  PAIN SYNDROME 09/11/2009  . NEOPLASM, MALIGNANT, PROSTATE, HX OF 09/11/2009  . HYPERTENSION, HX OF 09/11/2009   Past Medical History  Diagnosis Date  . Rheumatic heart disease     moderate mitral stenosis and history of CHF  . Hypertension   . Sick sinus syndrome      Paroxysmal and then constant AF; slow rate on modest AV nodal blocking agents  . Chronic low back pain   . Atrial fibrillation     Per record has Afib/Aflutter with SSS  . PVD (peripheral vascular disease)     Right superficial femoral to peroneal artery bypass graft with nonreversed saphenous vein composite  2002  . Mesenteric ischemia     SMA artery embolectomy in 2002  . Dementia     mild/H&P (12/13/2011)  . Prostate cancer   . Shortness of breath     "@ night" (12/13/2011)  . Pacemaker   . Pacemaker        Medication List    TAKE these medications        acetaminophen 650 MG CR tablet  Commonly known as:  TYLENOL  Take 650 mg by mouth every 6 (six) hours as needed for pain.     amLODipine 5 MG tablet  Commonly known as:  NORVASC  Take 1 tablet (5 mg total) by mouth daily.     calcium-vitamin D 500-200 MG-UNIT per tablet  Commonly known as:  OSCAL WITH D  Take 1 tablet by mouth 2 (two) times daily.     docusate sodium 100 MG capsule  Commonly known as:  COLACE  Take 100 mg by mouth 2 (two) times daily.     donepezil 5 MG tablet  Commonly known as:  ARICEPT  Take 5 mg by mouth daily.     feeding supplement (GLUCERNA SHAKE) Liqd  Take 237 mLs by mouth at bedtime.     fexofenadine 180 MG tablet  Commonly known as:  ALLEGRA  Take 180 mg by mouth daily.     HYDROcodone-acetaminophen 5-325 MG per tablet  Commonly known as:  NORCO/VICODIN  Take 1-2 tablets by mouth every 6 (six) hours as needed for moderate pain.     HYDROcodone-acetaminophen 5-325 MG per tablet  Commonly known as:  NORCO/VICODIN  Take 1-2 tablets by mouth every 6 (six) hours as needed for moderate pain.     losartan 100 MG  tablet  Commonly known as:  COZAAR  Take 100 mg by mouth daily.     methocarbamol 500 MG tablet  Commonly known as:  ROBAXIN  Take 1 tablet (500 mg total) by mouth every 6 (six) hours as needed for muscle spasms.     multivitamin with minerals Tabs tablet  Take 1 tablet by mouth daily.     polyethylene glycol packet  Commonly known as:  MIRALAX / GLYCOLAX  Take 17 g by mouth daily. Mixed in with 4-8 oz of liquids     REMERON 15 MG tablet  Generic drug:  mirtazapine  Take 30 mg by mouth at bedtime.     traMADol 50 MG tablet  Commonly known as:  ULTRAM  Take 50 mg by mouth every 6 (six) hours as needed (pain).     TRAVATAN Z 0.004 % Soln ophthalmic solution  Generic drug:  Travoprost (BAK Free)  Place 1 drop into both eyes at bedtime.     vitamin C 500 MG tablet  Commonly known as:  ASCORBIC ACID  Take 500 mg by mouth daily.     warfarin 5 MG tablet  Commonly known as:  COUMADIN  Take 5 mg by mouth daily. Takes 1 tablet daily EXCEPT Monday and Thursday.     COUMADIN 2.5 MG tablet  Generic drug:  warfarin  Take 2.5 mg by mouth 2 (two) times a week. On Monday and Thursday     zinc gluconate 50 MG tablet  Take 50 mg by mouth daily.       Vicodin #30 No Refill  Disposition: SNF  Patient's condition: is Good  Follow up: 1. Dr. Bridgett Larsson in 4 weeks   Virgina Jock, PA-C [facility] [phone] 04/01/2014  11:52 AM  Addendum  I have independently interviewed and examined the patient, and I agree with the physician assistant's discharge summary.  Follow up in the clinic for staple removal in 4 weeks.  Adele Barthel, MD [facility] [place] Office: [phone] Pager: [phone]  04/01/2014, 11:52 AM

## 2014-04-01 NOTE — Progress Notes (Signed)
Orthopedic Tech Progress Note Patient Details:  [name] Aug 05, 1924 [id number]  Patient ID: [name], male   DOB: 1924-08-16, 81 y.o.   MRN: [id number] Called in bio-tech brace order; spoke with Dolores Lory, Vladislav Axelson 04/01/2014, 11:19 AM

## 2014-04-01 NOTE — Progress Notes (Addendum)
  [facility] Progress Note  04/01/2014 7:23 AM 2 Days Post-Op  Subjective:  Says he's "fine."    Filed Vitals:   04/01/14 0719  BP: 151/86  Pulse: 67  Temp: 98 F (36.7 C)  Resp: 18    Physical Exam: General: alert this am Incisions:  Left AKA staple line clean and intact. No drainage seen. No edema.   CBC    Component Value Date/Time   WBC 7.6 04/01/2014 0428   RBC 4.33 04/01/2014 0428   HGB 11.7* 04/01/2014 0428   HCT 36.2* 04/01/2014 0428   PLT 231 04/01/2014 0428   MCV 83.6 04/01/2014 0428   MCH 27.0 04/01/2014 0428   MCHC 32.3 04/01/2014 0428   RDW 15.9* 04/01/2014 0428   LYMPHSABS 0.9 10/02/2013 0622   MONOABS 1.5* 10/02/2013 0622   EOSABS 0.1 10/02/2013 0622   BASOSABS 0.0 10/02/2013 0622    BMET    Component Value Date/Time   NA 141 04/01/2014 0428   K 4.2 04/01/2014 0428   CL 107 04/01/2014 0428   CO2 26 04/01/2014 0428   GLUCOSE 101* 04/01/2014 0428   BUN 18 04/01/2014 0428   CREATININE 0.90 04/01/2014 0428   CREATININE 1.10 06/27/2011 1413   CALCIUM 8.6 04/01/2014 0428   GFRNONAA 74* 04/01/2014 0428   GFRAA 86* 04/01/2014 0428    INR    Component Value Date/Time   INR 1.50* 04/01/2014 0428   INR 3.0 09/23/2013 1005   INR 2.9 05/17/2010 1129     Intake/Output Summary (Last 24 hours) at 04/01/14 0723 Last data filed at 04/01/14 0600  Gross per 24 hour  Intake   1685 ml  Output   1100 ml  Net    585 ml     Assessment:  81 y.o. male is s/p: left AKA 2 Days Post-Op  Plan: -Left AKA incision c/d/i. Stump is viable.  -Will order retention sock. -Malnutrition: continue feeding supplements.  -Dispo: return back to SNF today.  -Follow up in 4 weeks with Dr. Bridgett Larsson.     Virgina Jock, PA-C [facility] Office: [phone] Pager: [phone] 04/01/2014 7:23 AM   Addendum  I have independently interviewed and examined the patient, and I agree with the physician assistant's findings.     Adele Barthel, MD [facility] [place] Office: [phone] Pager: [phone]  04/01/2014, 8:39 AM

## 2014-04-01 NOTE — Clinical Social Work Note (Signed)
Patient will discharge to [facility] Anticipated discharge date:04/01/14 Family notified: Jaeger Trueheart notified of transfer Transportation by [facility]- scheduled for 2pm  CSW signing off.  Domenica Reamer, [place] Social Worker [phone]

## 2014-04-01 NOTE — Discharge Summary (Signed)
[facility] Discharge Summary  [name] Mar 07, 1925 81 y.o. male  [id number]  Admission Date: 03/30/2014  Discharge Date: 04/01/2014  Physician: Conrad Wahiawa, MD  Admission Diagnosis: Ischemic left foot M62.89  HPI:   This is a 81 y.o. male who is s/p right above-the-knee amputation (Date: 01/12/14).He returned to the [facility] office with his son with nursing facility reports of worsening pain and small ulcers left shin, severe pain in left foot. He is non ambulatory, is a resident of a SNF. He has dementia.   Hospital Course:  The patient was admitted to the hospital and taken to the operating room on 03/30/2014 and underwent: left above-the-knee amputation  The patient tolerated the procedure well and was transported to the PACU in stable condition.   On POD 1, his dressing was clean and dry. His dressing was taking down on POD 2. His staple line was clean, dry and intact. The stump was viable without edema. A retention stocking was ordered. He was stable for discharge. He was discharged back to SNF on POD 2 in good condition.    CBC    Component Value Date/Time   WBC 7.6 04/01/2014 0428   RBC 4.33 04/01/2014 0428   HGB 11.7* 04/01/2014 0428   HCT 36.2* 04/01/2014 0428   PLT 231 04/01/2014 0428   MCV 83.6 04/01/2014 0428   MCH 27.0 04/01/2014 0428   MCHC 32.3 04/01/2014 0428   RDW 15.9* 04/01/2014 0428   LYMPHSABS 0.9 10/02/2013 0622   MONOABS 1.5* 10/02/2013 0622   EOSABS 0.1 10/02/2013 0622   BASOSABS 0.0 10/02/2013 0622    BMET    Component Value Date/Time   NA 141 04/01/2014 0428   K 4.2 04/01/2014 0428   CL 107 04/01/2014 0428   CO2 26 04/01/2014 0428   GLUCOSE 101* 04/01/2014 0428   BUN 18 04/01/2014 0428   CREATININE 0.90 04/01/2014 0428   CREATININE 1.10 06/27/2011 1413   CALCIUM 8.6 04/01/2014 0428   GFRNONAA 74* 04/01/2014 0428   GFRAA 86* 04/01/2014 0428     Discharge Instructions:   The patient is discharged to SNF with  extensive instructions on wound care and progressive ambulation.  They are instructed not to drive or perform any heavy lifting until returning to see the physician in his office.  Discharge Instructions    Call MD for:  redness, tenderness, or signs of infection (pain, swelling, bleeding, redness, odor or green/yellow discharge around incision site)    Complete by:  As directed      Call MD for:  severe or increased pain, loss or decreased feeling  in affected limb(s)    Complete by:  As directed      Call MD for:  temperature >100.5    Complete by:  As directed      Discharge wound care:    Complete by:  As directed   Wash wound daily with soap and water. Twice a day dressing changes: Gauze to staple line. Wrap with kerlix and ACE wrap. Then place in retention sock.     Increase activity slowly    Complete by:  As directed   Walk with assistance use walker or cane as needed     Lifting restrictions    Complete by:  As directed   No lifting for 2 weeks     Resume previous diet    Complete by:  As directed            Discharge  Diagnosis:  Ischemic left foot M62.89  Secondary Diagnosis: Patient Active Problem List   Diagnosis Date Noted  . Atherosclerosis of native artery of left leg with ulceration 03/30/2014  . S/P AKA (above knee amputation) unilateral 01/28/2014  . 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 Extremity atherosclerosis with gangrene 01/07/2014  . Fracture, intertrochanteric, right femur 09/25/2013  . Hip fracture 09/25/2013  . Encounter for therapeutic drug monitoring 05/24/2013  . Pacemaker-Medtronic 12/17/2011  . Dementia 12/06/2011  . Incontinence of urine 12/06/2011  . Hypertension 07/16/2011  . Atrial fibrillation 06/12/2010  . 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 Encounter for long-term (current) use of anticoagulants 06/12/2010  . HYPERLIPIDEMIA 09/11/2009  . Mitral stenosis 09/11/2009  . LOW BACK PAIN  SYNDROME 09/11/2009  . NEOPLASM, MALIGNANT, PROSTATE, HX OF 09/11/2009  . HYPERTENSION, HX OF 09/11/2009   Past Medical History  Diagnosis Date  . Rheumatic heart disease     moderate mitral stenosis and history of CHF  . Hypertension   . Sick sinus syndrome      Paroxysmal and then constant AF; slow rate on modest AV nodal blocking agents  . Chronic low back pain   . Atrial fibrillation     Per record has Afib/Aflutter with SSS  . PVD (peripheral vascular disease)     Right superficial femoral to peroneal artery bypass graft with nonreversed saphenous vein composite  2002  . Mesenteric ischemia     SMA artery embolectomy in 2002  . Dementia     mild/H&P (12/13/2011)  . Prostate cancer   . Shortness of breath     "@ night" (12/13/2011)  . Pacemaker   . Pacemaker        Medication List    TAKE these medications        acetaminophen 650 MG CR tablet  Commonly known as:  TYLENOL  Take 650 mg by mouth every 6 (six) hours as needed for pain.     amLODipine 5 MG tablet  Commonly known as:  NORVASC  Take 1 tablet (5 mg total) by mouth daily.     calcium-vitamin D 500-200 MG-UNIT per tablet  Commonly known as:  OSCAL WITH D  Take 1 tablet by mouth 2 (two) times daily.     docusate sodium 100 MG capsule  Commonly known as:  COLACE  Take 100 mg by mouth 2 (two) times daily.     donepezil 5 MG tablet  Commonly known as:  ARICEPT  Take 5 mg by mouth daily.     feeding supplement (GLUCERNA SHAKE) Liqd  Take 237 mLs by mouth at bedtime.     fexofenadine 180 MG tablet  Commonly known as:  ALLEGRA  Take 180 mg by mouth daily.     HYDROcodone-acetaminophen 5-325 MG per tablet  Commonly known as:  NORCO/VICODIN  Take 1-2 tablets by mouth every 6 (six) hours as needed for moderate pain.     HYDROcodone-acetaminophen 5-325 MG per tablet  Commonly known as:  NORCO/VICODIN  Take 1-2 tablets by mouth every 6 (six) hours as needed for moderate pain.     losartan 100 MG  tablet  Commonly known as:  COZAAR  Take 100 mg by mouth daily.     methocarbamol 500 MG tablet  Commonly known as:  ROBAXIN  Take 1 tablet (500 mg total) by mouth every 6 (six) hours as needed for muscle spasms.     multivitamin with minerals Tabs tablet  Take  1 tablet by mouth daily.     polyethylene glycol packet  Commonly known as:  MIRALAX / GLYCOLAX  Take 17 g by mouth daily. Mixed in with 4-8 oz of liquids     REMERON 15 MG tablet  Generic drug:  mirtazapine  Take 30 mg by mouth at bedtime.     traMADol 50 MG tablet  Commonly known as:  ULTRAM  Take 50 mg by mouth every 6 (six) hours as needed (pain).     TRAVATAN Z 0.004 % Soln ophthalmic solution  Generic drug:  Travoprost (BAK Free)  Place 1 drop into both eyes at bedtime.     vitamin C 500 MG tablet  Commonly known as:  ASCORBIC ACID  Take 500 mg by mouth daily.     warfarin 5 MG tablet  Commonly known as:  COUMADIN  Take 5 mg by mouth daily. Takes 1 tablet daily EXCEPT Monday and Thursday.     COUMADIN 2.5 MG tablet  Generic drug:  warfarin  Take 2.5 mg by mouth 2 (two) times a week. On Monday and Thursday     zinc gluconate 50 MG tablet  Take 50 mg by mouth daily.       Vicodin #30 No Refill  Disposition: SNF  Patient's condition: is Good  Follow up: 1. Dr. Bridgett Larsson in 4 weeks   Virgina Jock, PA-C [facility] [phone] 04/01/2014  10:41 AM

## 2014-04-04 ENCOUNTER — Encounter (HOSPITAL_COMMUNITY): Payer: Self-pay | Admitting: Vascular Surgery

## 2014-04-20 ENCOUNTER — Encounter: Payer: Self-pay | Admitting: Vascular Surgery

## 2014-04-22 ENCOUNTER — Encounter: Payer: Self-pay | Admitting: Vascular Surgery

## 2014-04-22 ENCOUNTER — Ambulatory Visit (INDEPENDENT_AMBULATORY_CARE_PROVIDER_SITE_OTHER): Payer: Self-pay | Admitting: Vascular Surgery

## 2014-04-22 VITALS — BP 113/61 | HR 69 | Temp 97.6°F | Ht 64.0 in | Wt 90.0 lb

## 2014-04-22 DIAGNOSIS — Z89612 Acquired absence of left leg above knee: Secondary | ICD-10-CM

## 2014-04-22 DIAGNOSIS — Z89611 Acquired absence of right leg above knee: Secondary | ICD-10-CM

## 2014-04-22 DIAGNOSIS — I70261 Atherosclerosis of native arteries of extremities with gangrene, right leg: Secondary | ICD-10-CM

## 2014-04-22 NOTE — Progress Notes (Signed)
    Postoperative Visit   History of Present Illness  [name] is a 81 y.o. male who presents for postoperative follow-up for: Left above-the-knee amputation (Date: 03/30/14).  The patient's wounds are healed.  The patient notes pain is well controlled.  The patient's current symptoms are: none.  Pt reported significantly more comfortable at SNF now without recent screaming episodes.  For VQI Use Only  PRE-ADM LIVING: Nursing home  AMB STATUS: Wheelchair  Physical Examination  Filed Vitals:   04/22/14 0909  BP: 113/61  Pulse: 69  Temp: 97.6 F (36.4 C)   LLE: Incision is healed.  Staples are intact.  Medical Decision Making  [name] is a 81 y.o. male who presents s/p L above-the-knee ampuation.  The patient's stump is healing appropriately with resolution of pre-operative symptoms. I discussed in depth with the patient the nature of atherosclerosis, and emphasized the importance of maximal medical management including strict control of blood pressure, blood glucose, and lipid levels, obtaining regular exercise, and cessation of smoking.  The patient is aware that without maximal medical management the underlying atherosclerotic disease process will progress, possibly leading to a more proximal amputation. The patient agrees to participate in their maximal medical care.  Thank you for allowing [place] to participate in this patient's care.  The patient has not been referred for prosthetic fitting.  The patient can follow up with [place] as needed.  Adele Barthel, MD [facility] [place] Office: [phone] Pager: [phone]  04/22/2014, 9:29 AM

## 2014-07-14 ENCOUNTER — Ambulatory Visit: Payer: Self-pay | Admitting: *Deleted

## 2014-07-14 DIAGNOSIS — Z5181 Encounter for therapeutic drug level monitoring: Secondary | ICD-10-CM

## 2014-07-14 DIAGNOSIS — I4891 Unspecified atrial fibrillation: Secondary | ICD-10-CM

## 2014-10-07 ENCOUNTER — Ambulatory Visit (INDEPENDENT_AMBULATORY_CARE_PROVIDER_SITE_OTHER): Payer: Medicare HMO | Admitting: *Deleted

## 2014-10-07 DIAGNOSIS — I482 Chronic atrial fibrillation, unspecified: Secondary | ICD-10-CM

## 2014-10-07 LAB — CUP PACEART INCLINIC DEVICE CHECK
Battery Impedance: 428 Ohm
Battery Remaining Longevity: 91 mo
Battery Voltage: 2.78 V
Brady Statistic RV Percent Paced: 87 %
Lead Channel Impedance Value: 495 Ohm
Lead Channel Pacing Threshold Amplitude: 0.5 V
Lead Channel Pacing Threshold Pulse Width: 0.4 ms
Lead Channel Sensing Intrinsic Amplitude: 15.67 mV
Lead Channel Setting Pacing Pulse Width: 0.4 ms
Lead Channel Setting Sensing Sensitivity: 5.6 mV
MDC IDC MSMT LEADCHNL RA IMPEDANCE VALUE: 0 Ohm
MDC IDC SESS DTM: 20160722100814
MDC IDC SET LEADCHNL RV PACING AMPLITUDE: 2.5 V

## 2014-10-07 NOTE — Progress Notes (Signed)
Normal pacer check Estimated battery longevity 7.5 years No episodes recorded  No changes made ROV in January 2017 with GT/RDS.

## 2014-10-26 ENCOUNTER — Encounter: Payer: Self-pay | Admitting: Internal Medicine

## 2015-04-14 ENCOUNTER — Encounter (INDEPENDENT_AMBULATORY_CARE_PROVIDER_SITE_OTHER): Payer: Medicare HMO | Admitting: Internal Medicine

## 2015-04-14 DIAGNOSIS — I4891 Unspecified atrial fibrillation: Secondary | ICD-10-CM | POA: Diagnosis not present

## 2015-04-14 NOTE — Progress Notes (Signed)
HPI [name] returns today for followup. He is a pleasant, demented 82 yo man with HTN, peripheral vascular disease, chronic atrial fib, sick sinus syndrome, s/p PPM insertion. In the interim he has had a right AKA. He has severe pain in his left leg. He has lost weight. He is not thriving. No Known Allergies   Current Outpatient Prescriptions  Medication Sig Dispense Refill  . acetaminophen (TYLENOL) 650 MG CR tablet Take 650 mg by mouth every 6 (six) hours as needed for pain.    Marland Kitchen amLODipine (NORVASC) 5 MG tablet Take 1 tablet (5 mg total) by mouth daily. 90 tablet 3  . calcium-vitamin D (OSCAL WITH D) 500-200 MG-UNIT per tablet Take 1 tablet by mouth 2 (two) times daily.    Marland Kitchen docusate sodium (COLACE) 100 MG capsule Take 100 mg by mouth 2 (two) times daily.    Marland Kitchen donepezil (ARICEPT) 5 MG tablet Take 5 mg by mouth daily.    . feeding supplement, GLUCERNA SHAKE, (GLUCERNA SHAKE) LIQD Take 237 mLs by mouth at bedtime.  0  . fexofenadine (ALLEGRA) 180 MG tablet Take 180 mg by mouth daily.    Marland Kitchen HYDROcodone-acetaminophen (NORCO/VICODIN) 5-325 MG per tablet Take 1-2 tablets by mouth every 6 (six) hours as needed for moderate pain. 30 tablet 0  . losartan (COZAAR) 100 MG tablet Take 100 mg by mouth daily.    . methocarbamol (ROBAXIN) 500 MG tablet Take 1 tablet (500 mg total) by mouth every 6 (six) hours as needed for muscle spasms. 20 tablet 0  . mirtazapine (REMERON) 15 MG tablet Take 30 mg by mouth at bedtime.     . Multiple Vitamin (MULTIVITAMIN WITH MINERALS) TABS tablet Take 1 tablet by mouth daily.    . polyethylene glycol (MIRALAX / GLYCOLAX) packet Take 17 g by mouth daily. Mixed in with 4-8 oz of liquids    . traMADol (ULTRAM) 50 MG tablet Take 50 mg by mouth every 6 (six) hours as needed (pain).     . TRAVATAN Z 0.004 % SOLN ophthalmic solution Place 1 drop into both eyes at bedtime.     . vitamin C (ASCORBIC ACID) 500 MG tablet Take 500 mg by mouth daily.    Marland Kitchen warfarin (COUMADIN) 5 MG  tablet Take 5 mg by mouth daily. Takes 1 tablet daily EXCEPT Monday and Thursday.    . zinc gluconate 50 MG tablet Take 50 mg by mouth daily.     No current facility-administered medications for this visit.     Past Medical History  Diagnosis Date  . Rheumatic heart disease     moderate mitral stenosis and history of CHF  . Hypertension   . Sick sinus syndrome      Paroxysmal and then constant AF; slow rate on modest AV nodal blocking agents  . Chronic low back pain   . Atrial fibrillation     Per record has Afib/Aflutter with SSS  . PVD (peripheral vascular disease)     Right superficial femoral to peroneal artery bypass graft with nonreversed saphenous vein composite  2002  . Mesenteric ischemia     SMA artery embolectomy in 2002  . Dementia     mild/H&P (12/13/2011)  . Prostate cancer   . Shortness of breath     "@ night" (12/13/2011)  . Pacemaker   . Pacemaker     ROS:   All systems reviewed and negative except as noted in the HPI.   Past Surgical History  Procedure Laterality  Date  . Transurethral resection of prostate    . Embolectomy  2002    SMA artery  . Femoral-peroneal bypass graft  2002    right superficial (12/13/2011)  . Orif hip fracture Right 09/30/2013    Procedure: OPEN TREATMENT INTERNAL FIXATION OF RIGHT HIP FRACTURE;  Surgeon: Sanjuana Kava, MD;  Location: [facility];  Service: Orthopedics;  Laterality: Right;  . Amputation Right 01/12/2014    Procedure: RIGHT  ABOVE KNEE AMPUTATION;  Surgeon: Conrad Steilacoom, MD;  Location: [facility];  Service: Vascular;  Laterality: Right;  . Permanent pacemaker insertion N/A 12/16/2011    Procedure: PERMANENT PACEMAKER INSERTION;  Surgeon: Deboraha Sprang, MD;  Location: [facility] CATH LAB;  Service: Cardiovascular;  Laterality: N/A;  . Amputation Left 03/30/2014    Procedure: AMPUTATION ABOVE KNEE;  Surgeon: Conrad , MD;  Location: [place];  Service: Vascular;  Laterality: Left;     No family history on file.   Social  History   Social History  . Marital Status: Widowed    Spouse Name: N/A  . Number of Children: N/A  . Years of Education: N/A   Occupational History  . retired     yard work Customer service manager   Social History Main Topics  . Smoking status: Never Smoker   . Smokeless tobacco: Never Used  . Alcohol Use: No  . Drug Use: No  . Sexual Activity: No   Other Topics Concern  . Not on file   Social History Narrative     There were no vitals taken for this visit.  Physical Exam:  cachectic appearing elderly man, NAD HEENT: Unremarkable Neck:  7 cm JVD, no thyromegally Back:  No CVA tenderness Lungs:  Clear with no wheezes, rales, or rhonchi. HEART:  Regular rate rhythm, no murmurs, no rubs, no clicks, widely split S2.  Abd:  soft, positive bowel sounds, no organomegally, no rebound, no guarding Ext:  Right AKA, left leg bandaged and very tender to palpation and in a boot. Skin:  No rashes no nodules Neuro:  CN II through XII intact, motor grossly intact   DEVICE  Normal device function.  See PaceArt for details.   Assess/Plan:

## 2015-05-26 ENCOUNTER — Encounter: Payer: Medicare HMO | Admitting: Internal Medicine

## 2015-09-15 ENCOUNTER — Encounter (INDEPENDENT_AMBULATORY_CARE_PROVIDER_SITE_OTHER): Payer: Medicare HMO | Admitting: Internal Medicine

## 2015-09-15 DIAGNOSIS — I482 Chronic atrial fibrillation: Secondary | ICD-10-CM

## 2015-11-17 ENCOUNTER — Encounter: Payer: Self-pay | Admitting: Internal Medicine

## 2015-11-17 ENCOUNTER — Ambulatory Visit (INDEPENDENT_AMBULATORY_CARE_PROVIDER_SITE_OTHER): Payer: Medicare HMO | Admitting: Internal Medicine

## 2015-11-17 VITALS — BP 106/64 | HR 73 | Ht <= 58 in | Wt 121.0 lb

## 2015-11-17 DIAGNOSIS — I482 Chronic atrial fibrillation, unspecified: Secondary | ICD-10-CM

## 2015-11-17 DIAGNOSIS — Z950 Presence of cardiac pacemaker: Secondary | ICD-10-CM

## 2015-11-17 LAB — CUP PACEART INCLINIC DEVICE CHECK
Battery Impedance: 1018 Ohm
Battery Remaining Longevity: 57 mo
Battery Voltage: 2.77 V
Brady Statistic RV Percent Paced: 90 %
Implantable Lead Model: 5076
Lead Channel Impedance Value: 0 Ohm
Lead Channel Impedance Value: 506 Ohm
Lead Channel Setting Pacing Amplitude: 2.5 V
Lead Channel Setting Pacing Pulse Width: 0.4 ms
Lead Channel Setting Sensing Sensitivity: 4 mV
MDC IDC LEAD IMPLANT DT: 20130930
MDC IDC LEAD LOCATION: 753860
MDC IDC MSMT LEADCHNL RV PACING THRESHOLD AMPLITUDE: 0.5 V
MDC IDC MSMT LEADCHNL RV PACING THRESHOLD AMPLITUDE: 0.5 V
MDC IDC MSMT LEADCHNL RV PACING THRESHOLD PULSEWIDTH: 0.4 ms
MDC IDC MSMT LEADCHNL RV PACING THRESHOLD PULSEWIDTH: 0.4 ms
MDC IDC MSMT LEADCHNL RV SENSING INTR AMPL: 11.2 mV
MDC IDC SESS DTM: 20170901105637

## 2015-11-17 NOTE — Progress Notes (Signed)
HPI [name] returns today for followup. He is a pleasant, demented 82 yo man with HTN, peripheral vascular disease, chronic atrial fib, sick sinus syndrome, s/p PPM insertion. In the interim he has had a left AKA in addition to his right AKA. His left pain has resolved however. No other complaints. No Known Allergies   Current Outpatient Prescriptions  Medication Sig Dispense Refill  . amLODipine (NORVASC) 5 MG tablet Take 1 tablet (5 mg total) by mouth daily. 90 tablet 3  . calcium-vitamin D (OSCAL WITH D) 500-200 MG-UNIT per tablet Take 1 tablet by mouth 2 (two) times daily.    Marland Kitchen docusate sodium (COLACE) 100 MG capsule Take 100 mg by mouth 2 (two) times daily.    Marland Kitchen donepezil (ARICEPT) 5 MG tablet Take 5 mg by mouth daily.    . feeding supplement, GLUCERNA SHAKE, (GLUCERNA SHAKE) LIQD Take 237 mLs by mouth at bedtime.  0  . HYDROcodone-acetaminophen (NORCO/VICODIN) 5-325 MG per tablet Take 1-2 tablets by mouth every 6 (six) hours as needed for moderate pain. 30 tablet 0  . losartan (COZAAR) 100 MG tablet Take 100 mg by mouth daily.    . methocarbamol (ROBAXIN) 500 MG tablet Take 1 tablet (500 mg total) by mouth every 6 (six) hours as needed for muscle spasms. 20 tablet 0  . mirtazapine (REMERON) 15 MG tablet Take 30 mg by mouth at bedtime.     . polyethylene glycol (MIRALAX / GLYCOLAX) packet Take 17 g by mouth daily. Mixed in with 4-8 oz of liquids    . TRAVATAN Z 0.004 % SOLN ophthalmic solution Place 1 drop into both eyes at bedtime.     . vitamin C (ASCORBIC ACID) 500 MG tablet Take 500 mg by mouth daily.    Marland Kitchen warfarin (COUMADIN) 5 MG tablet Take 5 mg by mouth daily. Takes 1 tablet daily EXCEPT Monday and Thursday.    . zinc gluconate 50 MG tablet Take 50 mg by mouth daily.     No current facility-administered medications for this visit.      Past Medical History:  Diagnosis Date  . Atrial fibrillation [facility])    Per record has Afib/Aflutter with SSS  . Chronic low back pain    . Dementia    mild/H&P (12/13/2011)  . Hypertension   . Mesenteric ischemia ([place])    SMA artery embolectomy in 2002  . Pacemaker   . Pacemaker   . Prostate cancer ([place])   . PVD (peripheral vascular disease) ([place])    Right superficial femoral to peroneal artery bypass graft with nonreversed saphenous vein composite  2002  . Rheumatic heart disease    moderate mitral stenosis and history of CHF  . Shortness of breath    "@ night" (12/13/2011)  . Sick sinus syndrome ([facility])     Paroxysmal and then constant AF; slow rate on modest AV nodal blocking agents    ROS:   All systems reviewed and negative except as noted in the HPI.   Past Surgical History:  Procedure Laterality Date  . AMPUTATION Right 01/12/2014   Procedure: RIGHT  ABOVE KNEE AMPUTATION;  Surgeon: Conrad [place], MD;  Location: [facility];  Service: Vascular;  Laterality: Right;  . AMPUTATION Left 03/30/2014   Procedure: AMPUTATION ABOVE KNEE;  Surgeon: Conrad [place], MD;  Location: [facility];  Service: Vascular;  Laterality: Left;  . EMBOLECTOMY  2002   SMA artery  . FEMORAL-PERONEAL BYPASS GRAFT  2002   right superficial (12/13/2011)  .  ORIF HIP FRACTURE Right 09/30/2013   Procedure: OPEN TREATMENT INTERNAL FIXATION OF RIGHT HIP FRACTURE;  Surgeon: Sanjuana Kava, MD;  Location: [facility];  Service: Orthopedics;  Laterality: Right;  . PERMANENT PACEMAKER INSERTION N/A 12/16/2011   Procedure: PERMANENT PACEMAKER INSERTION;  Surgeon: Deboraha Sprang, MD;  Location[facility] C[facility] LAB;  Service: Cardiovascular;  Laterality: N/A;  . TRANSURETHRAL RESECTION OF PROSTATE       History reviewed. No pertinent family history.   Social History   Social History  . Marital status: Widowed    Spouse name: N/A  . Number of children: N/A  . Years of education: N/A   Occupational History  . retired     yard work Customer service manager   Social History Main Topics  . Smoking status: Never Smoker  . Smokeless tobacco: Never Used  . Alcohol use No  . Drug  use: No  . Sexual activity: No   Other Topics Concern  . Not on file   Social History Narrative  . No narrative on file     BP 106/64   Pulse 73   Ht 3\' 7"  (1.092 m)   Wt 121 lb (54.9 kg)   SpO2 94%   BMI 46.01 kg/m   Physical Exam:  cachectic appearing elderly man, NAD HEENT: Unremarkable Neck:  7 cm JVD, no thyromegally Back:  No CVA tenderness Lungs:  Clear with no wheezes, rales, or rhonchi. HEART:  Regular rate rhythm, no murmurs, no rubs, no clicks, widely split S2.  Abd:  soft, positive bowel sounds, no organomegally, no rebound, no guarding Ext:  Right AKA, left leg bandaged and very tender to palpation and in a boot. Skin:  No rashes no nodules Neuro:  CN II through XII intact, motor grossly intact   DEVICE  Normal device function.  See [employer] for details.   Assess/Plan: 1. Chronic atrial fib - his ventricular rate is well controlled. He will continue warfarin 2. Peripheral vascular disease - his bilateral AKA's have healed. 3. PPM - his Medtronic VVI PM is working normally. Will recheck in several months.  [name].

## 2015-11-17 NOTE — Patient Instructions (Addendum)
Your physician wants you to follow-up in: 1 Year with Dr. Lovena Le. You will receive a reminder letter in the mail two months in advance. If you don't receive a letter, please call our office to schedule the follow-up appointment.  Your physician recommends that you schedule a follow-up appointment with [facility].  Your physician recommends that you continue on your current medications as directed. Please refer to the Current Medication list given to you today.  If you need a refill on your cardiac medications before your next appointment, please call your pharmacy.  Thank you for choosing [facility]!

## 2015-11-30 NOTE — Progress Notes (Signed)
This encounter was created in error - please disregard.

## 2016-01-17 DEATH — deceased

## 2018-07-03 ENCOUNTER — Telehealth: Payer: Self-pay | Admitting: Physician Assistant

## 2018-07-03 NOTE — Telephone Encounter (Signed)
Pt overdue for device check, number is non-working, no DPR on file.  Updated device clinic LTFU list to follow up.  Tommye Standard, PA-C

## 2019-06-14 ENCOUNTER — Telehealth: Payer: Self-pay | Admitting: *Deleted

## 2019-06-14 NOTE — Telephone Encounter (Signed)
Patient overdue for f/u with Dr. Lovena Le in [place]. Last visit 2017. Attempted EC numbers--Danny Kuehler' numbers out of service/incorrect. LMOVM for Tina Griffiths, direct DC phone number and office hours provided. LMOVM for Estil Daft in medical records at [facility] (pt was a resident at [facility] per notes).

## 2019-06-23 ENCOUNTER — Encounter: Payer: Self-pay | Admitting: *Deleted

## 2019-06-23 NOTE — Telephone Encounter (Signed)
No response received. Certified letter mailed 06/23/19.

## 2019-07-30 NOTE — Telephone Encounter (Signed)
Certified letter returned to sender, unclaimed, unable to forward.  Unenrolled from [place]. Marked inactive in [employer].
